# Patient Record
Sex: Female | Born: 1985 | Race: Black or African American | Hispanic: No | Marital: Married | Smoking: Former smoker
Health system: Southern US, Community
[De-identification: ages and names within clinical notes are randomized; demographics above are authoritative.]

## PROBLEM LIST (undated history)

## (undated) ENCOUNTER — Inpatient Hospital Stay (HOSPITAL_COMMUNITY): Payer: Self-pay

## (undated) DIAGNOSIS — D649 Anemia, unspecified: Secondary | ICD-10-CM

## (undated) DIAGNOSIS — K589 Irritable bowel syndrome without diarrhea: Secondary | ICD-10-CM

## (undated) DIAGNOSIS — Z5189 Encounter for other specified aftercare: Secondary | ICD-10-CM

## (undated) DIAGNOSIS — M24419 Recurrent dislocation, unspecified shoulder: Secondary | ICD-10-CM

## (undated) HISTORY — PX: WISDOM TOOTH EXTRACTION: SHX21

---
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
## 2004-07-09 ENCOUNTER — Ambulatory Visit (HOSPITAL_COMMUNITY): Admission: AD | Admit: 2004-07-09 | Discharge: 2004-07-09 | Payer: Self-pay | Admitting: Obstetrics and Gynecology

## 2004-08-13 ENCOUNTER — Inpatient Hospital Stay (HOSPITAL_COMMUNITY): Admission: AD | Admit: 2004-08-13 | Discharge: 2004-08-16 | Payer: Self-pay | Admitting: Obstetrics and Gynecology

## 2004-08-18 ENCOUNTER — Emergency Department (HOSPITAL_COMMUNITY): Admission: EM | Admit: 2004-08-18 | Discharge: 2004-08-18 | Payer: Self-pay | Admitting: Emergency Medicine

## 2005-05-13 ENCOUNTER — Ambulatory Visit: Payer: Self-pay | Admitting: Internal Medicine

## 2005-07-13 ENCOUNTER — Emergency Department (HOSPITAL_COMMUNITY): Admission: EM | Admit: 2005-07-13 | Discharge: 2005-07-13 | Payer: Self-pay | Admitting: Emergency Medicine

## 2006-07-17 ENCOUNTER — Inpatient Hospital Stay (HOSPITAL_COMMUNITY): Admission: AD | Admit: 2006-07-17 | Discharge: 2006-07-20 | Payer: Self-pay | Admitting: Obstetrics and Gynecology

## 2006-08-18 ENCOUNTER — Inpatient Hospital Stay (HOSPITAL_COMMUNITY): Admission: AD | Admit: 2006-08-18 | Discharge: 2006-08-19 | Payer: Self-pay | Admitting: Obstetrics and Gynecology

## 2007-04-09 ENCOUNTER — Emergency Department (HOSPITAL_COMMUNITY): Admission: EM | Admit: 2007-04-09 | Discharge: 2007-04-09 | Payer: Self-pay | Admitting: Emergency Medicine

## 2007-12-12 ENCOUNTER — Emergency Department (HOSPITAL_COMMUNITY): Admission: EM | Admit: 2007-12-12 | Discharge: 2007-12-12 | Payer: Self-pay | Admitting: Emergency Medicine

## 2008-05-04 ENCOUNTER — Emergency Department (HOSPITAL_COMMUNITY): Admission: EM | Admit: 2008-05-04 | Discharge: 2008-05-04 | Payer: Self-pay | Admitting: Emergency Medicine

## 2008-06-06 ENCOUNTER — Other Ambulatory Visit: Admission: RE | Admit: 2008-06-06 | Discharge: 2008-06-06 | Payer: Self-pay | Admitting: Obstetrics and Gynecology

## 2008-06-30 ENCOUNTER — Ambulatory Visit (HOSPITAL_COMMUNITY): Payer: Self-pay | Admitting: Obstetrics & Gynecology

## 2008-06-30 ENCOUNTER — Encounter (HOSPITAL_COMMUNITY): Admission: RE | Admit: 2008-06-30 | Discharge: 2008-07-30 | Payer: Self-pay | Admitting: Obstetrics & Gynecology

## 2008-07-24 ENCOUNTER — Emergency Department (HOSPITAL_COMMUNITY): Admission: EM | Admit: 2008-07-24 | Discharge: 2008-07-24 | Payer: Self-pay | Admitting: Emergency Medicine

## 2008-12-02 ENCOUNTER — Inpatient Hospital Stay (HOSPITAL_COMMUNITY): Admission: AD | Admit: 2008-12-02 | Discharge: 2008-12-04 | Payer: Self-pay | Admitting: Obstetrics and Gynecology

## 2008-12-02 ENCOUNTER — Ambulatory Visit: Payer: Self-pay | Admitting: Advanced Practice Midwife

## 2008-12-20 ENCOUNTER — Emergency Department (HOSPITAL_COMMUNITY): Admission: EM | Admit: 2008-12-20 | Discharge: 2008-12-20 | Payer: Self-pay | Admitting: Emergency Medicine

## 2009-04-20 ENCOUNTER — Emergency Department (HOSPITAL_COMMUNITY): Admission: EM | Admit: 2009-04-20 | Discharge: 2009-04-20 | Payer: Self-pay | Admitting: Emergency Medicine

## 2010-05-20 ENCOUNTER — Emergency Department (HOSPITAL_COMMUNITY): Payer: Medicaid Other

## 2010-05-20 ENCOUNTER — Emergency Department (HOSPITAL_COMMUNITY)
Admission: EM | Admit: 2010-05-20 | Discharge: 2010-05-20 | Disposition: A | Discharge status: Home / Self Care | Payer: Medicaid Other | Attending: Emergency Medicine | Admitting: Emergency Medicine

## 2010-05-20 DIAGNOSIS — K5900 Constipation, unspecified: Secondary | ICD-10-CM | POA: Insufficient documentation

## 2010-05-20 DIAGNOSIS — R109 Unspecified abdominal pain: Secondary | ICD-10-CM | POA: Insufficient documentation

## 2010-05-20 LAB — URINALYSIS, ROUTINE W REFLEX MICROSCOPIC
Bilirubin Urine: NEGATIVE
Nitrite: NEGATIVE
Specific Gravity, Urine: 1.01 (ref 1.005–1.030)
pH: 5.5 (ref 5.0–8.0)

## 2010-05-20 LAB — CBC
HCT: 46 % (ref 36.0–46.0)
MCV: 86.3 fL (ref 78.0–100.0)
Platelets: 144 10*3/uL — ABNORMAL LOW (ref 150–400)
RBC: 5.33 MIL/uL — ABNORMAL HIGH (ref 3.87–5.11)
WBC: 9.6 10*3/uL (ref 4.0–10.5)

## 2010-05-20 LAB — DIFFERENTIAL
Lymphocytes Relative: 13 % (ref 12–46)
Lymphs Abs: 1.3 10*3/uL (ref 0.7–4.0)
Neutrophils Relative %: 80 % — ABNORMAL HIGH (ref 43–77)

## 2010-05-20 LAB — COMPREHENSIVE METABOLIC PANEL
ALT: 8 U/L (ref 0–35)
Calcium: 9.1 mg/dL (ref 8.4–10.5)
Creatinine, Ser: 0.66 mg/dL (ref 0.4–1.2)
GFR calc non Af Amer: >60 mL/min (ref >60)
Glucose, Bld: 81 mg/dL (ref 70–99)
Sodium: 136 mEq/L (ref 135–145)
Total Protein: 7.2 g/dL (ref 6.0–8.3)

## 2010-05-20 LAB — URINE MICROSCOPIC-ADD ON

## 2010-05-20 LAB — POCT PREGNANCY, URINE: Preg Test, Ur: NEGATIVE

## 2010-05-21 LAB — URINE CULTURE: Culture  Setup Time: 201203261840

## 2010-05-30 LAB — RPR: RPR Ser Ql: NONREACTIVE

## 2010-05-30 LAB — CBC
MCHC: 31 g/dL (ref 30.0–36.0)
MCV: 73.8 fL — ABNORMAL LOW (ref 78.0–100.0)
Platelets: 208 10*3/uL (ref 150–400)
RDW: 19.7 % — ABNORMAL HIGH (ref 11.5–15.5)

## 2010-06-06 LAB — PREGNANCY, URINE: Preg Test, Ur: POSITIVE

## 2010-06-06 LAB — URINALYSIS, ROUTINE W REFLEX MICROSCOPIC
Bilirubin Urine: NEGATIVE
Hgb urine dipstick: NEGATIVE
Ketones, ur: NEGATIVE mg/dL
Nitrite: NEGATIVE
Urobilinogen, UA: 0.2 mg/dL (ref 0.0–1.0)

## 2010-06-06 LAB — DIFFERENTIAL
Basophils Relative: 0 % (ref 0–1)
Eosinophils Absolute: 0.2 10*3/uL (ref 0.0–0.7)
Lymphs Abs: 0.4 10*3/uL — ABNORMAL LOW (ref 0.7–4.0)
Monocytes Absolute: 0.4 10*3/uL (ref 0.1–1.0)
Monocytes Relative: 6 % (ref 3–12)
Neutro Abs: 5.7 10*3/uL (ref 1.7–7.7)
Neutrophils Relative %: 84 % — ABNORMAL HIGH (ref 43–77)

## 2010-06-06 LAB — GC/CHLAMYDIA PROBE AMP, GENITAL
Chlamydia, DNA Probe: NEGATIVE
GC Probe Amp, Genital: NEGATIVE

## 2010-06-06 LAB — CBC
Hemoglobin: 8.4 g/dL — ABNORMAL LOW (ref 12.0–15.0)
MCHC: 29.8 g/dL — ABNORMAL LOW (ref 30.0–36.0)
MCV: 60.6 fL — ABNORMAL LOW (ref 78.0–100.0)
RBC: 4.66 MIL/uL (ref 3.87–5.11)
WBC: 6.8 10*3/uL (ref 4.0–10.5)

## 2010-06-06 LAB — WET PREP, GENITAL: Trich, Wet Prep: NONE SEEN

## 2010-07-09 NOTE — H&P (Signed)
[name], [name]                ACCOUNT NO.:  [id number]   MEDICAL RECORD NO.:  [id number]          PATIENT TYPE:  OIB   LOCATION:  LDR1                          FACILITY:  [facility]   PHYSICIAN:  Tilda Burrow, M.D. DATE OF BIRTH:  13-Aug-1985   DATE OF ADMISSION:  08/18/2006  DATE OF DISCHARGE:  LH                              HISTORY & PHYSICAL   REASON FOR ADMISSION:  Pregnancy at 38 weeks, 4 cm, prodromal labor,  maternal fetal discomfort, request for induction of labor.  After  carefully discussing risks and benefits with [facility], she feels that the  benefits outweigh the risks, and she desires induction.   PAST MEDICAL HISTORY:  Anemia.   PAST SURGICAL HISTORY:  Negative.   ALLERGIES:  SULFA.   FAMILY HISTORY:  Benign.   PRENATAL COURSE:  Essentially uneventful with the exception she has  chronic anemia.  Blood type O positive.  UDS is negative.  Rubella  immune, hepatitis B surface antigen negative, HIV negative, HSV  negative, HPV negative, serology nonreactive.  Pap normal.  GC/Chlamydia  on both cultures were negative. AFP normal.  GBS was done at the  hospital.  We will get the results over there.  A 28-week hemoglobin  9.4, a 28-week hematocrit 31.3, one-hour glucose was 110, sickle cell  screen was negative.   PHYSICAL EXAMINATION:  VITAL SIGNS:  Weight 172, blood pressure 122/64.  Fetal heart rate 140, strong and regular.  Fundal height is 38-39 cm.   Cervix is 4 cm, 80% effaced, -1 station.   PLAN:  We are going to admit for Pitocin augmentation and induction of  labor.      Zerita Boers, Lanier Clam      Tilda Burrow, M.D.  Electronically Signed    DL/MEDQ  D:  04/54/0981  T:  08/18/2006  Job:  [id number]   cc:   Jeoffrey Massed, MD  Fax: [phone]   Tilda Burrow, M.D.  Fax: [phone]

## 2010-07-09 NOTE — Op Note (Signed)
[name], [name]                ACCOUNT NO.:  [id number]   MEDICAL RECORD NO.:  [id number]          PATIENT TYPE:  OIB   LOCATION:  LDR1                          FACILITY:  [facility]   PHYSICIAN:  Tilda Burrow, M.D. DATE OF BIRTH:  05-29-1985   DATE OF PROCEDURE:  08/18/2006  DATE OF DISCHARGE:                               OPERATIVE REPORT   PROCEDURE:  Epidural catheter placement.  Epidural time 1 p.m.   SURGEON:  Tilda Burrow, M.D.   DESCRIPTION OF PROCEDURE:  A continuous lumbar epidural catheter placed  in the L2-3 interspace on the first attempt at a depth of 5 cm beneath  the skin using a loss of resistance technique, after the standard  prepping and draping of the back.  A #19 gauge Tuohy needle was used for  the placement.  The catheter was placed after a 3 mL test dose of 1.5%  Xylocaine.  There was no maternal tachycardia.  This was followed by a 3  mL bolus of 1.5% Xylocaine with epinephrine.  The patient then began to  develop a strong urge to push, so we went ahead and completed taping the  catheter to the back and repositioned the patient in the supine position  before initiating the bolus.  The test dose was sufficient to give her  good symmetric analgesic effect over the abdomen, but with perineal  sensation intact.  The patient was found to be completely dilated at a  +1 station.   PROCEDURE:  .    Delivery note.   DESCRIPTION OF PROCEDURE:  The patient progressed and took about 30  minutes before being able to push the baby out.  She initially had  minimal urge to push and then developed urge to push and pushed through  about three contractions, bringing the baby in nicely to delivery from  the direct OA position.  The right shoulder was impacted beneath the  symphysis pubis, so with the legs in the McRoberts position, the  posterior shoulder was identified and rotated in a corkscrew maneuver  with ease, and the baby delivered with the left arm first,  completely  released and then the rest of the baby delivered.  The placenta was  delivered after the baby was completely delivered.  The cord had been  clamped and the infant dried off on the maternal abdomen.  The cord  blood samples were obtained prior to delivery of the placenta.  The  perineum showed first-degree lacerations only, none requiring suture  repair.  The estimated blood loss was 500 mL due to her tendency toward  uterine apnea occurring about 10 minutes after delivery.      Tilda Burrow, M.D.  Electronically Signed    JVF/MEDQ  D:  08/18/2006  T:  08/18/2006  Job:  [id number]

## 2010-07-09 NOTE — Discharge Summary (Signed)
[name], [name]                ACCOUNT NO.:  [id number]   MEDICAL RECORD NO.:  [id number]          PATIENT TYPE:  INP   LOCATION:  A415                          FACILITY:  [facility]   PHYSICIAN:  Lazaro Arms, M.D.   DATE OF BIRTH:  1985-03-21   DATE OF ADMISSION:  07/17/2006  DATE OF DISCHARGE:  05/26/2008LH                               DISCHARGE SUMMARY   DISCHARGE DIAGNOSES:  1. Intrauterine pregnancy at 36-1/[redacted] weeks gestation.  2. Preterm labor.  3. Unremarkable hospital course.   Please refer to the history and physical and antepartum chart for  details of admission to the hospital.   HOSPITAL COURSE:  The patient was admitted at 33-1/2 weeks with regular  uterine contractions.  She was found to be 2 to 3 cm, 50% effaced, and -  1 to -2 station.  She was immediately begun on magnesium sulfate  tocolysis.  She was given betamethasone.  She had a Foley catheter  placed and placed on bedrest.  Her contractions were every 2 to 6  minutes, and they quickly dissipated.  Occasionally she would have  uterine irritability, but her cervix less than 24 hours after admission  was unchanged.  I checked her again in the morning of Jul 20, 2006 and  she was 2, 50%, and -2 vertex.  The cervix was soft, but there was no  pressure on the lower uterine segment as it was before.  She was  switched over from magnesium tocolysis after approximately 36 hours of  tocolysis over to terbutaline.  She has done well on terbutaline.  Of  course, she is jittery but handling it well.  She is discharged to home  on the morning of Jul 20, 2006 in stable condition with 5 mg of  terbutaline every 4 hours, and I will see her in the office next week.  Again, she is status post betamethasone x2.      Lazaro Arms, M.D.  Electronically Signed     LHE/MEDQ  D:  07/20/2006  T:  07/20/2006  Job:  [id number]

## 2010-07-09 NOTE — H&P (Signed)
NAMEDWAYNE, BEGAY                ACCOUNT NO.:  [id number]   MEDICAL RECORD NO.:  [id number]          PATIENT TYPE:  INP   LOCATION:  A415                          FACILITY:  [facility]   PHYSICIAN:  Lazaro Arms, M.D.   DATE OF BIRTH:  1985-04-26   DATE OF ADMISSION:  07/17/2006  DATE OF DISCHARGE:  LH                              HISTORY & PHYSICAL   Alayne is a 25 year old gravida 2, para 1, estimated date of delivery  of 09/02/2006, 33-1/[redacted] weeks gestation, who presented to labor and  delivery complaining of regular uterine contractions.  She did not have  that problem with her first pregnancy, she went to full-time with no  problems.  She is having contractions every 2-6 minutes.  She is 2-3 cm,  50% effaced and -1, -2 station.  As a result she will be begun on  magnesium sulfate tocolysis.   PAST MEDICAL HISTORY:  Negative.   PAST SURGERY:  Negative.   ALLERGIES:  SULFA DRUGS.   FAMILY HISTORY:  She is married, works as a [employer] manager.   She had a vaginal delivery in 2006, 7 pounds 9 ounces.   Blood type is 0 positive.  Varicella immune, rubella immune.  Hepatitis  B is negative.  HSV-2 is negative.  HIV is nonreactive.  GC/chlamydia  negative x2.  Pap was normal.  Serology is nonreactive.  Group B strep  culture is obtained today.  Her Glucola is 110.   IMPRESSION:  1. Intrauterine pregnancy 33-1/[redacted] weeks gestation.  2. Preterm labor.   PLAN:  The patient is going to get magnesium tocolysis and betamethasone  injections.      Lazaro Arms, M.D.  Electronically Signed     LHE/MEDQ  D:  07/20/2006  T:  07/20/2006  Job:  [id number]

## 2010-07-12 NOTE — Op Note (Signed)
NAMECONCEPTION, DOEBLER              ACCOUNT NO.:  [id number]   MEDICAL RECORD NO.:  [id number]          PATIENT TYPE:  INP   LOCATION:  LDR1                          FACILITY:  [facility]   PHYSICIAN:  Tilda Burrow, M.D. DATE OF BIRTH:  04/02/1985   DATE OF PROCEDURE:  08/14/2004  DATE OF DISCHARGE:                                 OPERATIVE REPORT   ONSET OF LABOR:  August 14, 2004, at 8 a.m.   DATE OF DELIVERY:  August 14, 2004, at 1406.   DELIVERY NOTE:  Inayah had a normal spontaneous vaginal delivery of a  viable female infant under epidural anesthesia per Dr. Despina Hidden.  Upon delivery of  head, infant rotated and spontaneously delivered shoulders and rest of the  body.  Mouth and nose were thoroughly suctioned.  Infant dried.  Cord  clamped and cut.  Infant to nursery.  Nurses for newborn care.  On delivery,  infant had spontaneous respirations, spontaneous movement of all  extremities, and pinked up well.   Upon inspection, there is a left labial laceration which required local  anesthetic and subcuticular closure with 2-0 Vicryl.  The patient tolerated  the procedure well.  There was a very small right labial laceration which  required no stitching.  Perineum was intact.  l   Third stage of labor was actively managed with 20 units of Pitocin and 1000  mL D5 lactated Ringers at a rapid rate.  Placenta delivered spontaneously  via Manus Rudd mechanism.  Upon inspection, three-vessel cord is noted, and  membranes are noted to be intact upon inspection. Estimated blood loss was  approximately 300 mL.  Infant and mother both stabilized.  Epidural catheter  was removed with blue tip intact and transferred up to the postpartum unit  in stable condition.       DL/MEDQ  D:  16/11/9602  T:  08/14/2004  Job:  [id number]   cc:   [facility]

## 2010-07-12 NOTE — Op Note (Signed)
[name], [name]                ACCOUNT NO.:  [id number]   MEDICAL RECORD NO.:  [id number]          PATIENT TYPE:  INP   LOCATION:  A401                          FACILITY:  [facility]   PHYSICIAN:  Tilda Burrow, M.D. DATE OF BIRTH:  [birth year]   DATE OF PROCEDURE:  08/14/2004  DATE OF DISCHARGE:  08/16/2004                                 OPERATIVE REPORT   PROCEDURE:  Continuous lumbar epidural catheter was placed using loss of  resistance technique at the L3-4 interspace.  The patient had draping of the  back, placement of a 19-gauge Tuohy needle in the L2-3 interspace, with loss  of resistance technique to identify the epidural space.  A 5 mL test dose of  1.5% lidocaine with epinephrine was instilled, followed by 0.125% Marcaine  used in same.  Continuous infusion of 12 mL/hr., and the patient tolerated  the epidural with good analgesic effect achieved.  See delivery note  dictated later for further delivery management issue answers.       JVF/MEDQ  D:  09/11/2004  T:  09/12/2004  Job:  [id number]

## 2010-07-12 NOTE — Consult Note (Signed)
NAMEMARZELL, [name]                ACCOUNT NO.:  [id number]   MEDICAL RECORD NO.:  [id number]          PATIENT TYPE:  EMS   LOCATION:  ED                            FACILITY:  [facility]   PHYSICIAN:  Tilda Burrow, M.D. DATE OF BIRTH:  [birth year]   DATE OF CONSULTATION:  08/18/2004  DATE OF DISCHARGE:                                   CONSULTATION   EMERGENCY ROOM CONSULTATION:   CHIEF COMPLAINT:  Severe pain in near laceration from delivery.  This 25-  year-old female less than 1 week postpartum called yesterday complaining of  severe pain where she was stitched up after lacerations at delivery.  Inspection reveals normal perineal body with well-healed first degree skin  cracks only.  She had two periurethral lacerations on either side of the  urethral orifice and the one on the left was repaired apparently with 0  Dexon with the knot protruding from the skin and being exquisitely  sensitive, the right side is healing nicely.  Stitches placed under traction  and snipped with removal of stitch.  Patient to resume routine postpartum  care.   IMPRESSION:  Tenderness secondary to exposed suture status post perineal  lacerations at delivery.  Follow up p.r.n.  Routine postpartum care orders  resumed.       JVF/MEDQ  D:  08/18/2004  T:  08/18/2004  Job:  [id number]

## 2010-07-12 NOTE — H&P (Signed)
[name], [name]              ACCOUNT NO.:  [id number]   MEDICAL RECORD NO.:  [id number]           PATIENT TYPE:  INP   LOCATION:  LDR1                          FACILITY:  [facility]   PHYSICIAN:  Tilda Burrow, M.D. DATE OF BIRTH:  [birth year]   DATE OF ADMISSION:  08/13/2004  DATE OF DISCHARGE:  LH                                HISTORY & PHYSICAL   REASON FOR ADMISSION:  Pregnancy at 40 weeks for elective induction of labor  due to maternal discomfort and fatigue.   HISTORY OF PRESENT ILLNESS:  The patient is a 25 year old, gravida 1, para  0, EDC June 21, who is seen in the office today and complains of inability  to sleep due to internal discomfort and also fatigue.   PAST MEDICAL HISTORY:  Negative.   PAST SURGICAL HISTORY:  Negative.   ALLERGIES:  She is allergic to SULFA.   SOCIAL HISTORY:  She is single.  Father of pregnancy is present and  supportive.   FAMILY HISTORY:  Negative.   PRENATAL COURSE:  Uneventful.  Blood type is O positive.  Urine drug screen  is negative.  HIV negative.  HSV-1 positive.  HSV-2 negative. GC and  chlamydia are negative.  GBS is negative.  Twenty-eight-week hemoglobin 9.9.  28-week hematocrit 30.0.  One-hour glucose 126.  Screen was negative.   PHYSICAL EXAMINATION:  VITAL SIGNS:  Today, weight is 160, blood pressure  110/68.  There is good fetal movement.  Fetal heart tones 140, strong and  regular.  PELVIC: Cervix is 2 cm, 80% effaced, -1 station.   After assessing risks and benefits with the patient, she continues to desire  elective induction of labor.  She is to be admitted tonight for Foley bulb  induction of labor and Pitocin in the a.m.       DL/MEDQ  D:  08/65/7846  T:  08/13/2004  Job:  [id number]

## 2010-07-31 ENCOUNTER — Emergency Department (HOSPITAL_COMMUNITY)
Admission: EM | Admit: 2010-07-31 | Discharge: 2010-07-31 | Disposition: A | Discharge status: Home / Self Care | Payer: Medicaid Other | Attending: Emergency Medicine | Admitting: Emergency Medicine

## 2010-07-31 DIAGNOSIS — F17200 Nicotine dependence, unspecified, uncomplicated: Secondary | ICD-10-CM | POA: Insufficient documentation

## 2010-07-31 DIAGNOSIS — L989 Disorder of the skin and subcutaneous tissue, unspecified: Secondary | ICD-10-CM | POA: Insufficient documentation

## 2010-08-02 LAB — HERPES SIMPLEX VIRUS CULTURE: Culture: NOT DETECTED

## 2010-08-03 LAB — CULTURE, ROUTINE-ABSCESS: Gram Stain: NONE SEEN

## 2010-08-19 ENCOUNTER — Emergency Department (HOSPITAL_COMMUNITY)
Admission: EM | Admit: 2010-08-19 | Discharge: 2010-08-19 | Disposition: A | Discharge status: Home / Self Care | Payer: Medicaid Other | Attending: Emergency Medicine | Admitting: Emergency Medicine

## 2010-08-19 DIAGNOSIS — L259 Unspecified contact dermatitis, unspecified cause: Secondary | ICD-10-CM | POA: Insufficient documentation

## 2010-09-30 ENCOUNTER — Emergency Department (HOSPITAL_COMMUNITY)
Admission: EM | Admit: 2010-09-30 | Discharge: 2010-09-30 | Disposition: A | Discharge status: Home / Self Care | Payer: Medicaid Other | Attending: Emergency Medicine | Admitting: Emergency Medicine

## 2010-09-30 DIAGNOSIS — F17200 Nicotine dependence, unspecified, uncomplicated: Secondary | ICD-10-CM | POA: Insufficient documentation

## 2010-09-30 DIAGNOSIS — R21 Rash and other nonspecific skin eruption: Secondary | ICD-10-CM

## 2010-09-30 MED ORDER — MUPIROCIN CALCIUM 2 % EX CREA: TOPICAL_CREAM | Freq: Three times a day (TID) | CUTANEOUS | Status: AC

## 2010-09-30 MED ORDER — HYDROXYZINE HCL 25 MG PO TABS: 25.0000 mg | ORAL_TABLET | Freq: Four times a day (QID) | ORAL | Status: AC | PRN

## 2010-09-30 MED ORDER — ACYCLOVIR 400 MG PO TABS: 400.0000 mg | ORAL_TABLET | Freq: Three times a day (TID) | ORAL | Status: AC

## 2010-09-30 NOTE — ED Notes (Signed)
Pt reports rash to r lower leg x 5 months.  Area is on inner aspect of calf.  Has scabby appearance.  Reports got better with prednisone but when stopped taking prednisone rash got bigger.  Rash also spreading to left leg now.  Pt says was told to follow up with dermatologist but says no one is accepting medicaid.

## 2010-09-30 NOTE — ED Provider Notes (Signed)
History   Chart scribed for Laray Anger, DO by Enos Fling; the patient was seen in room APA01/APA01; this patient's care was started at 9:44 AM.    CSN: [id number] Arrival date & time: 09/30/2010  7:56 AM  Chief Complaint  Patient presents with  . Rash   HPI [name] is a 25 y.o. female who presents to the Emergency Department complaining of rash. Pt reports itchy dry rash to right medial upper calf that has been constant and worsening x 5 months. At onset, rash was red, itchy, and weeping and appeared to have central spot and is growing larger; now it is dry and scabbing throughout the rash but still itchy, states under the scab the skin is gray with bumps. Pt has been seen in ED 7 times and at UC 4 times, taken 2 rounds of doxycycline (last finished 3 days ago) with no relief, and acyclovir treatment once which clear rash but then it returned. Also reports similar small rash starting to left lateral knee and also a dark dry spot to her left lip onset within last few days. No other rashes noted. Pt has been referred to dermatologist several times but unable to go d/t medicaid. Denies f/c, cough, congestion, ha, n/v/d, sob, or cp.   History reviewed. No pertinent past medical history.  History reviewed. No pertinent past surgical history.  History reviewed. No pertinent family history.  History  Substance Use Topics  . Smoking status: Current Everyday Smoker  . Smokeless tobacco: Not on file  . Alcohol Use: No    OB History    Grav Para Term Preterm Abortions TAB SAB Ect Mult Living                  Review of Systems ROS: Statement: All systems negative except as marked or noted in the HPI; Constitutional: Negative for fever and chills. ; ; Eyes: Negative for eye pain and discharge. ; ; ENMT: Negative for ear pain, hoarseness, nasal congestion, sinus pressure and sore throat. ; ; Cardiovascular: Negative for chest pain, palpitations, diaphoresis, dyspnea and  peripheral edema. ; ; Respiratory: Negative for cough, wheezing and stridor. ; ; Gastrointestinal: Negative for nausea, vomiting, diarrhea and abdominal pain. ; ; Genitourinary: Negative for dysuria, flank pain and hematuria. ; ; Musculoskeletal: Negative for back pain and neck pain. ; ; Skin: Positive for rash and skin lesion, +pruritis. ; Neuro: Negative for headache, lightheadedness and neck stiffness. ;   Physical Exam  BP 121/78  Pulse 79  Temp(Src) 97.7 F (36.5 C) (Oral)  Resp 16  Ht 5\' 6"  (1.676 m)  Wt 155 lb (70.308 kg)  BMI 25.02 kg/m2  SpO2 99%  Physical Exam 0945: Physical examination:  Nursing notes reviewed; Vital signs and O2 SAT reviewed;  Constitutional: Well developed, Well nourished, Well hydrated, In no acute distress; Head:  Normocephalic, atraumatic; Eyes: EOMI, PERRL, No scleral icterus; ENMT: Mouth and pharynx normal, Mucous membranes moist, +several dark spots on lips with surrounding dry/peeling skin; Neck: Supple, Full range of motion, No lymphadenopathy; Cardiovascular: Regular rate and rhythm, No murmur, rub, or gallop; Respiratory: Breath sounds clear & equal bilaterally, No rales, rhonchi, wheezes, or rub, Normal respiratory effort/excursion; Chest: Nontender, Movement normal; Abdomen: Soft, Nontender, Nondistended, Normal bowel sounds; Extremities: Pulses normal, No tenderness, No edema, No calf edema or asymmetry.; Neuro: AA&Ox3, Major CN grossly intact.  No gross focal motor or sensory deficits in extremities.; Skin: Color normal, Warm, Dry.  +dried scabbed area approx  8x4cm diameter medial right calf with surrounding excoriations, with same rash on left lateral lower ext approx 3x3cm area.   ED Course  Procedures MDM Number of Diagnoses or Management Options MDM Reviewed: previous chart and vitals   10:21 AM:  Pt describes the rash underneath the scab as "little bumps that look wet."  Pt has been taking doxycycline x2 courses, as well as clotrimazole cream  over the past 5 months, rx by [facility]  Has not seen her PMD [facility]) for same.  Pt was seen in ED 07/2010 for same x2.  Pt very specifically states "whatever Ms. Tripplet gave me worked."  Per [employer] review, pt was tx acyclovir at that time and instructed to f/u with Derm MD.  States she needs her PMD to refer her to Derm MD, which she hasn't asked her PMD to do that "yet."  Will rx another course of acyclovir.  Pt encouraged to f/u with her PMD and Derm MD for definitive dx/tx.  Verb understanding.      I personally performed the services described in this documentation, which was scribed in my presence. The recorded information has been reviewed and considered.   Alysandra Lobue Allison Quarry, DO 09/30/10 2030

## 2010-10-27 ENCOUNTER — Emergency Department (HOSPITAL_COMMUNITY)
Admission: EM | Admit: 2010-10-27 | Discharge: 2010-10-27 | Disposition: A | Discharge status: Home / Self Care | Payer: Medicaid Other | Attending: Emergency Medicine | Admitting: Emergency Medicine

## 2010-10-27 ENCOUNTER — Encounter (HOSPITAL_COMMUNITY): Payer: Self-pay | Admitting: *Deleted

## 2010-10-27 ENCOUNTER — Emergency Department (HOSPITAL_COMMUNITY): Payer: Medicaid Other

## 2010-10-27 DIAGNOSIS — Y92009 Unspecified place in unspecified non-institutional (private) residence as the place of occurrence of the external cause: Secondary | ICD-10-CM | POA: Insufficient documentation

## 2010-10-27 DIAGNOSIS — X500XXA Overexertion from strenuous movement or load, initial encounter: Secondary | ICD-10-CM | POA: Insufficient documentation

## 2010-10-27 DIAGNOSIS — S43006A Unspecified dislocation of unspecified shoulder joint, initial encounter: Secondary | ICD-10-CM

## 2010-10-27 DIAGNOSIS — S43016A Anterior dislocation of unspecified humerus, initial encounter: Secondary | ICD-10-CM | POA: Insufficient documentation

## 2010-10-27 MED ORDER — SODIUM CHLORIDE 0.9 % IV SOLN
Freq: Once | INTRAVENOUS | Status: AC
  Administered 2010-10-27: 03:00:00 via INTRAVENOUS

## 2010-10-27 MED ORDER — ETOMIDATE 2 MG/ML IV SOLN
10.0000 mg | Freq: Once | INTRAVENOUS | Status: AC
  Administered 2010-10-27: 10 mg via INTRAVENOUS
  Filled 2010-10-27: qty 10

## 2010-10-27 NOTE — ED Notes (Signed)
Patient is resting comfortably. 

## 2010-10-27 NOTE — ED Notes (Signed)
Pt turned over in bed and her right shoulder popped out of place.

## 2010-10-27 NOTE — ED Notes (Signed)
See procedural sedation flowsheet. Relocation of rt shoulder without incident confirmed xray

## 2010-10-27 NOTE — ED Notes (Signed)
Family at bedside. 

## 2010-10-27 NOTE — ED Notes (Signed)
Vital signs stable. 

## 2010-10-27 NOTE — ED Notes (Signed)
Pt self ambulated with a steady gait with stand by assist to the rest room and back.

## 2010-10-27 NOTE — ED Provider Notes (Signed)
History     CSN: [id number] Arrival date & time: 10/27/2010  2:28 AM  Chief Complaint  Patient presents with  . Shoulder Injury  . Shoulder Pain   HPI Comments: Seen 20  Patient is a 25 y.o. female presenting with shoulder injury and shoulder pain. The history is provided by the patient.  Shoulder Injury This is a new (Patient with h/o shoulder dislocation due to basketball injury in 2008. Recurrent dislocation infrequent.) problem. The current episode started 1 to 2 hours ago (crawling up on the bed and felt shoulder go out.). The problem has not changed since onset.Exacerbated by: movement. The symptoms are relieved by nothing. She has tried nothing for the symptoms.  Shoulder Pain    History reviewed. No pertinent past medical history.  No past surgical history on file.  History reviewed. No pertinent family history.  History  Substance Use Topics  . Smoking status: Current Everyday Smoker  . Smokeless tobacco: Not on file  . Alcohol Use: Yes    OB History    Grav Para Term Preterm Abortions TAB SAB Ect Mult Living                  Review of Systems  All other systems reviewed and are negative.    Physical Exam  BP 126/78  Pulse 92  Temp 97.4 F (36.3 C)  Resp 20  Ht 5\' 6"  (1.676 m)  Wt 155 lb (70.308 kg)  BMI 25.02 kg/m2  SpO2 99%  Physical Exam  Constitutional: She is oriented to person, place, and time. She appears well-developed and well-nourished. She appears distressed.  HENT:  Head: Normocephalic and atraumatic.  Eyes: EOM are normal.  Neck: Normal range of motion.  Cardiovascular: Normal rate, normal heart sounds and intact distal pulses.   Pulmonary/Chest: Effort normal and breath sounds normal.  Abdominal: Soft.  Musculoskeletal:       Anterior dislocation of right shoulder. Pulses 2+, cap refill brisk. Able to move fingers, wrist, elbow.  Neurological: She is alert and oriented to person, place, and time.  Skin: Skin is warm and dry.     ED Course  shoulder reduction Date/Time: 10/27/2010 4:30 AM Performed by: Annamarie Dawley. Authorized by: Annamarie Dawley Consent: Verbal consent obtained. Risks and benefits: risks, benefits and alternatives were discussed Consent given by: patient Patient understanding: patient states understanding of the procedure being performed Patient consent: the patient's understanding of the procedure matches consent given Procedure consent: procedure consent matches procedure scheduled Relevant documents: relevant documents present and verified Test results: test results available and properly labeled Patient identity confirmed: verbally with patient and arm band Injury location: shoulder Pre-procedure distal perfusion: normal Pre-procedure neurological function: normal Pre-procedure range of motion: reduced Local anesthesia used: no Patient sedated: yes Sedatives: etomidate Vitals: Vital signs were monitored during sedation. Immobilization: splint Post-procedure neurovascular assessment: post-procedure neurovascularly intact Comments: Patient tolerated procedure well. O2 sats remained 100%. Awakened without incidence.   Dg Shoulder Right  10/27/2010  *RADIOLOGY REPORT*  Clinical Data: Right shoulder pain and dislocation after fall.  RIGHT SHOULDER - 2+ VIEW  Comparison: 07/24/2008  Findings: Anterior dislocation of the right humeral head with respect to the glenoid.  Coracoclavicular and acromioclavicular spaces appear intact.  No focal bone lesions demonstrated.  IMPRESSION: Anterior dislocation of the right shoulder.  Original Report Authenticated By: Marlon Pel, M.D.   Dg Shoulder Right Port  10/27/2010  *RADIOLOGY REPORT*  Clinical Data: Post relocation view of the right shoulder  PORTABLE RIGHT SHOULDER - 2+ VIEW  Comparison: 10/27/2010 at 0252 hours  Findings: There is interval relocation of the previously demonstrated right shoulder dislocation.  Suggestion of slight  irregularity of the inferior glenoid which might represent a Bankart lesion.  No obvious fracture of the humeral head on single view.  IMPRESSION: The humerus appears to be relocated with respect to the glenoid. Suggestion of Bankart fracture of the inferior glenoid rim.  Original Report Authenticated By: Marlon Pel, M.D.   Patient with h/o R shoulder dislocation with minimal exertion. Conscious sedation and reduction of the shoulder with no complications. Shoulder immobilizer placed.  MDM Reviewed: nursing note and vitals Interpretation: x-ray      Nicoletta Dress. Colon Branch, MD 10/27/10 [phone]

## 2010-10-27 NOTE — ED Notes (Signed)
Pt self ambulated out with a steady gait stating no needs 

## 2010-12-11 LAB — CBC
HCT: 32.5 — ABNORMAL LOW
MCHC: 32.1
MCV: 74 — ABNORMAL LOW
Platelets: 217
RDW: 28.6 — ABNORMAL HIGH

## 2010-12-11 LAB — DIFFERENTIAL
Eosinophils Absolute: 0.1
Lymphs Abs: 1.1
Monocytes Absolute: 0.7
Neutrophils Relative %: 84 — ABNORMAL HIGH

## 2010-12-11 LAB — CORD BLOOD GAS (ARTERIAL): pH cord blood (arterial): 7.24

## 2010-12-11 LAB — ABO/RH: ABO/RH(D): O POS

## 2010-12-11 LAB — HEMOGLOBIN AND HEMATOCRIT, BLOOD
HCT: 30.6 — ABNORMAL LOW
Hemoglobin: 10 — ABNORMAL LOW

## 2011-08-09 ENCOUNTER — Emergency Department (HOSPITAL_COMMUNITY)
Admission: EM | Admit: 2011-08-09 | Discharge: 2011-08-09 | Disposition: A | Discharge status: Home / Self Care | Payer: Medicaid Other | Attending: Emergency Medicine | Admitting: Emergency Medicine

## 2011-08-09 ENCOUNTER — Encounter (HOSPITAL_COMMUNITY): Payer: Self-pay | Admitting: Emergency Medicine

## 2011-08-09 DIAGNOSIS — N946 Dysmenorrhea, unspecified: Secondary | ICD-10-CM

## 2011-08-09 DIAGNOSIS — N390 Urinary tract infection, site not specified: Secondary | ICD-10-CM | POA: Insufficient documentation

## 2011-08-09 DIAGNOSIS — F17200 Nicotine dependence, unspecified, uncomplicated: Secondary | ICD-10-CM | POA: Insufficient documentation

## 2011-08-09 LAB — POCT I-STAT, CHEM 8
BUN: 11 mg/dL (ref 6–23)
Calcium, Ion: 1.16 mmol/L (ref 1.12–1.32)
Chloride: 103 mEq/L (ref 96–112)
HCT: 43 % (ref 36.0–46.0)
Potassium: 3.6 mEq/L (ref 3.5–5.1)

## 2011-08-09 LAB — URINALYSIS, ROUTINE W REFLEX MICROSCOPIC
Bilirubin Urine: NEGATIVE
Nitrite: POSITIVE — AB
Protein, ur: >300 mg/dL — AB
Specific Gravity, Urine: 1.025 (ref 1.005–1.030)
Urobilinogen, UA: 4 mg/dL — ABNORMAL HIGH (ref 0.0–1.0)

## 2011-08-09 LAB — URINE MICROSCOPIC-ADD ON

## 2011-08-09 LAB — WET PREP, GENITAL
Clue Cells Wet Prep HPF POC: NONE SEEN
WBC, Wet Prep HPF POC: NONE SEEN

## 2011-08-09 LAB — PREGNANCY, URINE: Preg Test, Ur: NEGATIVE

## 2011-08-09 MED ORDER — ACETAMINOPHEN 500 MG PO TABS
1000.0000 mg | ORAL_TABLET | Freq: Once | ORAL | Status: AC
  Administered 2011-08-09: 1000 mg via ORAL
  Filled 2011-08-09: qty 2

## 2011-08-09 MED ORDER — NAPROXEN 250 MG PO TABS: 250.0000 mg | ORAL_TABLET | Freq: Two times a day (BID) | ORAL | Status: DC

## 2011-08-09 MED ORDER — CEPHALEXIN 500 MG PO CAPS: 500.0000 mg | ORAL_CAPSULE | Freq: Four times a day (QID) | ORAL | Status: AC

## 2011-08-09 MED ORDER — KETOROLAC TROMETHAMINE 60 MG/2ML IM SOLN
60.0000 mg | Freq: Once | INTRAMUSCULAR | Status: AC
  Administered 2011-08-09: 60 mg via INTRAMUSCULAR
  Filled 2011-08-09: qty 2

## 2011-08-09 NOTE — ED Provider Notes (Signed)
History   This chart was scribed for Laray Anger, DO by Sofie Rower. The patient was seen in room APA03/APA03 and the patient's care was started at 9:00 AM     CSN: [id number]  Arrival date & time 08/09/11  0840   First MD Initiated Contact with Patient 08/09/11 [phone]      Chief Complaint  Patient presents with  . Pelvic Pain    HPI  [name] is a 26 y.o. female who presents to the Emergency Department complaining of gradual onset and persistence of constant pelvic "pain" that began four days ago.  Has been associated with vaginal bleeding and nausea. Pt describes the pain as "sore."  Pt has been taking OTC advil with intermittent relief, with LD approx 0400 today.  Denies vaginal discharge, no fevers, no rash.     History  Substance Use Topics  . Smoking status: Current Everyday Smoker  . Smokeless tobacco: Not on file  . Alcohol Use: Yes    Review of Systems ROS: Statement: All systems negative except as marked or noted in the HPI; Constitutional: Negative for fever and chills. ; ; Eyes: Negative for eye pain, redness and discharge. ; ; ENMT: Negative for ear pain, hoarseness, nasal congestion, sinus pressure and sore throat. ; ; Cardiovascular: Negative for chest pain, palpitations, diaphoresis, dyspnea and peripheral edema. ; ; Respiratory: Negative for cough, wheezing and stridor. ; ; Gastrointestinal: Negative for nausea, vomiting, diarrhea, abdominal pain, blood in stool, hematemesis, jaundice and rectal bleeding. . ; ; Genitourinary: Negative for dysuria, flank pain and hematuria. ; GYN: +menses, +pelvic pain.  No vaginal discharge, no vulvar pain.   ; Musculoskeletal: Negative for back pain and neck pain. Negative for swelling and trauma.; ; Skin: Negative for pruritus, rash, abrasions, blisters, bruising and skin lesion.; ; Neuro: Negative for headache, lightheadedness and neck stiffness. Negative for weakness, altered level of consciousness , altered mental status,  extremity weakness, paresthesias, involuntary movement, seizure and syncope.     Allergies  Sulfa antibiotics  Home Medications   Current Outpatient Rx  Name Route Sig Dispense Refill  . ESTRADIOL 2 MG VA RING Vaginal Place 2 mg vaginally every 3 (three) months. follow package directions     . ESTRADIOL PO Oral Take 2 mg by mouth daily.      Marland Kitchen METRONIDAZOLE 0.75 % EX CREA Topical Apply topically 2 (two) times daily.        BP 126/70  Pulse 77  Temp 98.2 F (36.8 C) (Oral)  Resp 18  Ht 5\' 4"  (1.626 m)  Wt 165 lb (74.844 kg)  BMI 28.32 kg/m2  SpO2 100%  Physical Exam 0905: Physical examination:  Nursing notes reviewed; Vital signs and O2 SAT reviewed;  Constitutional: Well developed, Well nourished, Well hydrated, In no acute distress; Head:  Normocephalic, atraumatic; Eyes: EOMI, PERRL, No scleral icterus; ENMT: Mouth and pharynx normal, Mucous membranes moist; Neck: Supple, Full range of motion, No lymphadenopathy; Cardiovascular: Regular rate and rhythm, No murmur, rub, or gallop; Respiratory: Breath sounds clear & equal bilaterally, No rales, rhonchi, wheezes.  Speaking full sentences with ease, Normal respiratory effort/excursion; Chest: Nontender, Movement normal; Abdomen: Soft, Nontender, Nondistended, Normal bowel sounds; Genitourinary: No CVA tenderness; Pelvic exam performed with permission of pt and female ED RN assist during exam.  External genitalia w/o lesions. Vaginal vault without discharge, small amount dark blood with clots in vault.  Cervix w/o lesions, not friable, +IUD strings visualized from os.  GC/chlam and wet prep  obtained and sent to lab.  Bimanual exam w/o CMT, uterine or adnexal tenderness.; Extremities: Pulses normal, No tenderness, No edema, No calf edema or asymmetry.; Neuro: AA&Ox3, Major CN grossly intact.  Speech clear. Normal coordination, gait steady. No gross focal motor or sensory deficits in extremities.; Skin: Color normal, Warm, Dry.   ED Course    Procedures   MDM  MDM Reviewed: nursing note and vitals Interpretation: labs   Results for orders placed during the hospital encounter of 08/09/11  PREGNANCY, URINE      Component Value Range   Preg Test, Ur NEGATIVE  NEGATIVE  URINALYSIS, ROUTINE W REFLEX MICROSCOPIC      Component Value Range   Color, Urine RED (*) YELLOW   APPearance CLOUDY (*) CLEAR   Specific Gravity, Urine 1.025  1.005 - 1.030   pH 6.5  5.0 - 8.0   Glucose, UA 100 (*) NEGATIVE mg/dL   Hgb urine dipstick LARGE (*) NEGATIVE   Bilirubin Urine NEGATIVE  NEGATIVE   Ketones, ur 15 (*) NEGATIVE mg/dL   Protein, ur >119 (*) NEGATIVE mg/dL   Urobilinogen, UA 4.0 (*) 0.0 - 1.0 mg/dL   Nitrite POSITIVE (*) NEGATIVE   Leukocytes, UA MODERATE (*) NEGATIVE  WET PREP, GENITAL      Component Value Range   Yeast Wet Prep HPF POC NONE SEEN  NONE SEEN   Trich, Wet Prep NONE SEEN  NONE SEEN   Clue Cells Wet Prep HPF POC NONE SEEN  NONE SEEN   WBC, Wet Prep HPF POC NONE SEEN  NONE SEEN  POCT I-STAT, CHEM 8      Component Value Range   Sodium 140  135 - 145 mEq/L   Potassium 3.6  3.5 - 5.1 mEq/L   Chloride 103  96 - 112 mEq/L   BUN 11  6 - 23 mg/dL   Creatinine, Ser 1.47  0.50 - 1.10 mg/dL   Glucose, Bld 97  70 - 99 mg/dL   Calcium, Ion 8.29  5.62 - 1.32 mmol/L   TCO2 23  0 - 100 mmol/L   Hemoglobin 14.6  12.0 - 15.0 g/dL   HCT 13.0  86.5 - 78.4 %  URINE MICROSCOPIC-ADD ON      Component Value Range   RBC / HPF TOO NUMEROUS TO COUNT  <3 RBC/hpf   Bacteria, UA FEW (*) RARE     10:53 AM:  Will tx for UTI.  GC/chlam pending.  Pt improved after toradol and tylenol.  Wants to go home now.  Dx testing d/w pt and family.  Questions answered.  Verb understanding, agreeable to d/c home with outpt f/u.       I personally performed the services described in this documentation, which was scribed in my presence. The recorded information has been reviewed and considered. Aviannah Castoro Allison Quarry,  DO 08/10/11 1753

## 2011-08-09 NOTE — Discharge Instructions (Signed)
RESOURCE GUIDE  Chronic Pain Problems: Contact [facility]  [phone] Patients need to be referred by their primary care doctor.  Insufficient Money for Medicine: Contact United Way:  call "211" or [facility] [phone].  No Primary Care Doctor: - Call [facility]  [phone] - can help you locate a primary care doctor that  accepts your insurance, provides certain services, etc. - Physician Referral Service- 1-800-533-3463  Agencies that provide inexpensive medical care: - [facility]  [phone] - [facility]  [phone] - [facility]  [phone] - [facility] Clinic  [phone] - Planned Parenthood  [phone] - [facility]  [phone]  Medicaid-accepting [place] Providers: - [facility]- [address]  [phone], Mon-Fri 9am-7pm, Sat 9am-1pm - [facility]- [address], [address]  [phone] - [facility]- [address], [address]  [phone] - [facility]- [address] [place] [address]  [phone] - Veita Bland- [address], [address], [phone]  Only accepts [place] Access Medicaid patients after they have their name  applied to their card  Self Pay (no insurance) in [place]: - Sickle Cell Patients: Dr Eric Dean, [facility]  [address], [phone] - [facility]- [address]  [phone]       -     [facility]- [address]       -     [facility]- see information above (Speak to Pam H if you do not have insurance)       -  [facility]- [address], [phone]       -  [facility] [place]- [address],  [phone]       -  [facility]- [address] [place] [address], [phone]       -  Dr Osei-Bonsu-  [address], [place], [phone]       -  [facility]- [address], [phone]       -  [facility]- [address] [place] [address], [phone], also [address], [phone]       -    [facility]- [address], [phone], 1st & 3rd Saturday   every month, 10am-1pm  1) Find a Doctor and Pay Out of Pocket Although you won't have to find out who is covered by your insurance plan, it is a good idea to ask around and get recommendations. You will then need to call the office and see if the doctor you have chosen will accept you as a new patient and what types of options they offer for patients who are self-pay. Some doctors offer discounts or will set up payment plans for their patients who do not have insurance, but you will need to ask so you aren't surprised when you get to your appointment.  2) Contact Your Local Health Department Not all health departments have doctors that can see patients for sick visits, but many do, so it is worth a call to see if yours does. If you don't know where your local health department is, you can check in your phone book. The CDC also has a tool to help you locate your state's health department, and many state websites also have   listings of all of their local health departments.  3) Find a Walk-in Clinic If your illness is not likely to be very severe or complicated, you may want to try a walk in clinic. These are popping up all over the country in pharmacies, drugstores, and shopping centers. They're usually staffed by nurse practitioners or physician assistants that have been trained to treat common illnesses and complaints. They're usually fairly quick and inexpensive. However, if you have serious medical issues or chronic medical problems, these are probably not your best option  STD Testing - [place] [facility] [place], [facility], [address], [place], phone [phone] or 1-877-539-9860.  Monday - Friday, call for an appointment. - [place]  [facility] [place], [facility] Clinic, [address], [place], phone [phone] or 1-877-539-9860.  Monday - Friday, call for an appointment.  Abuse/Neglect: - [place] Child Abuse Hotline (336) 641-3795 - [place] Child Abuse Hotline 800-378-5315 (After Hours)  Emergency Shelter:  [facility] (336) 271-5985  Maternity Homes: - [facility] (336) 275-9566 - [facility] (704) 372-4663  MRSA Hotline #:   832-7006  [place] Resources  [facility] [place]  [employer] [place] [facility] [address]                 [address]         [address]  [place]                                               [place]                              [place] Phone:  [phone]                                  Phone:  [phone]                   Phone:  [phone]  [place] [facility], [phone] - [place] Services - [facility]- 1-888-581-9988       -     [facility] in [place], [address],                                  336-349-4454, Insurance  [place] Child Abuse Hotline (336) 342-1394 or (336) 342-3537 (After Hours)   Behavioral Health Services  Substance Abuse Resources: - [facility]  336-882-2125 - [facility] 336-784-9470 - [facility] 336-285-9073 - [facility] 336-845-3988 - Residential & Outpatient Substance Abuse Program  800-659-3381  Psychological Services: -  Health  [phone] - [facility]  [phone] - [place] [facility], [address], [place], ACCESS LINE: 1-800-853-5163 or 336-641-4981, Http://www.[facility].com/services/adult.htm  Dental Assistance  If unable to pay or uninsured, contact:  [facility] or [place] [facility] to become qualified for the adult dental  clinic.  Patients with Medicaid:  [facility] [address], [phone], [phone]  If unable   to pay, or uninsured, contact [facility] [phone]) or [facility] Department (520) [phone] in [address] [place], [phone] in [facility]) to become qualified for the adult dental clinic  Other Low-Cost Community Dental Services: - [facility]- [address] [place], [place], [place], [address], [phone], Ext. [phone], 2nd and 4th Thursday of the month at 6:30am.  10 clients each day by appointment, can sometimes see walk-in patients if someone does not show for an appointment. [facility]- [address] [place], [place], [address], [phone] - [facility]- [address] [place], [place], [address], [phone] [facility] Department- [phone] [facility] Health Department- [phone] [facility] Department269 [phone]     Take the prescriptions as directed.  Also take over the counter tylenol, as directed on packaging, as needed for pain.  Your gonorrhea and chlamydia culture is pending results, and you will receive a phone call in the next several days if it is positive.  .  Call your regular OB/GYN doctor on Monday to schedule a follow up appointment within the next week.  Return to the Emergency Department immediately if worsening.

## 2011-08-09 NOTE — ED Notes (Signed)
Patient with c/o pelvic pain x 4 days with vaginal bleeding. Has Mirena IUD, string still intact per patient. Reports moderate, dark red bleeding with clots.

## 2011-08-11 LAB — GC/CHLAMYDIA PROBE AMP, GENITAL: Chlamydia, DNA Probe: NEGATIVE

## 2011-08-19 ENCOUNTER — Other Ambulatory Visit: Payer: Self-pay | Admitting: Advanced Practice Midwife

## 2011-12-27 ENCOUNTER — Emergency Department (HOSPITAL_COMMUNITY): Payer: Medicaid Other

## 2011-12-27 ENCOUNTER — Emergency Department (HOSPITAL_COMMUNITY)
Admission: EM | Admit: 2011-12-27 | Discharge: 2011-12-27 | Disposition: A | Discharge status: Home / Self Care | Payer: Medicaid Other | Attending: Emergency Medicine | Admitting: Emergency Medicine

## 2011-12-27 ENCOUNTER — Encounter (HOSPITAL_COMMUNITY): Payer: Self-pay | Admitting: Emergency Medicine

## 2011-12-27 DIAGNOSIS — Z882 Allergy status to sulfonamides status: Secondary | ICD-10-CM | POA: Insufficient documentation

## 2011-12-27 DIAGNOSIS — M24419 Recurrent dislocation, unspecified shoulder: Secondary | ICD-10-CM

## 2011-12-27 DIAGNOSIS — F17200 Nicotine dependence, unspecified, uncomplicated: Secondary | ICD-10-CM | POA: Insufficient documentation

## 2011-12-27 HISTORY — DX: Recurrent dislocation, unspecified shoulder: M24.419

## 2011-12-27 MED ORDER — OXYCODONE-ACETAMINOPHEN 5-325 MG PO TABS: 1.0000 | ORAL_TABLET | Freq: Four times a day (QID) | ORAL | Status: DC | PRN

## 2011-12-27 MED ORDER — PROPOFOL 10 MG/ML IV BOLUS
0.5000 mg/kg | Freq: Once | INTRAVENOUS | Status: AC
  Administered 2011-12-27: 23:00:00 via INTRAVENOUS
  Filled 2011-12-27: qty 1

## 2011-12-27 MED ORDER — FENTANYL CITRATE 0.05 MG/ML IJ SOLN
50.0000 ug | Freq: Once | INTRAMUSCULAR | Status: AC
  Administered 2011-12-27: 23:00:00 via INTRAVENOUS
  Filled 2011-12-27: qty 2

## 2011-12-27 NOTE — ED Provider Notes (Signed)
History   This chart was scribed for [employer]. Rubin Payor, MD by Toya Smothers. The patient was seen in room APA16A/APA16A. Patient's care was started at 2149.  CSN: [id number]  Arrival date & time 12/27/11  2149   First MD Initiated Contact with Patient 12/27/11 2155      Chief Complaint  Patient presents with  . Shoulder Injury   The history is provided by the patient. No language interpreter was used.    [name] is a 26 y.o. female who presents to the Emergency Department complaining of 2 hours of recurrent, constant  R shoulder pain with obvious deformity. Pain is aggravated with ROM and consistent with previous occurences of shoulder displacement. She reports onset after reaching over a fence. Pt reports no improvement despite attempting to relocate the shoulder. Pain has not been treated PTA. No fever, chills, cough, congestion, rhinorrhea, chest pain, SOB, or n/v/d. Medical Hx includes recurrent shoulder dislocation. Pt is a current everyday smoker and admits the use of alcohol.    Past Medical History  Diagnosis Date  . Shoulder dislocation, recurrent     History reviewed. No pertinent past surgical history.  History reviewed. No pertinent family history.  History  Substance Use Topics  . Smoking status: Current Every Day Smoker  . Smokeless tobacco: Not on file  . Alcohol Use: Yes   Review of Systems  All other systems reviewed and are negative.    Allergies  Sulfa antibiotics  Home Medications  No current outpatient prescriptions on file.  BP 120/71  Pulse 99  Temp 97.6 F (36.4 C) (Oral)  Resp 20  Ht 5\' 4"  (1.626 m)  Wt 155 lb (70.308 kg)  BMI 26.61 kg/m2  SpO2 100%  Physical Exam  Constitutional: She is oriented to person, place, and time. She appears well-developed and well-nourished.  HENT:  Head: Normocephalic and atraumatic.  Cardiovascular: Normal rate, regular rhythm and normal heart sounds.   No murmur heard. Pulmonary/Chest: Effort  normal and breath sounds normal. No respiratory distress. She has no wheezes. She exhibits no tenderness.  Abdominal: Soft. There is no tenderness.  Musculoskeletal:       Fullness anteriorly. Pain with ROM of R shoulder. No tenderness over elbow. Axillary. Radial pulses intact. Right hand strength and ROM intact. Distally neurovascularly intact. Good cap refill.  Neurological: She is alert and oriented to person, place, and time. Coordination normal.  Skin: Skin is warm and dry. No rash noted.  Psychiatric: She has a normal mood and affect. Her behavior is normal.    ED Course  ORTHOPEDIC INJURY TREATMENT Date/Time: 12/27/2011 11:35 PM Performed by: Benjiman Core R. Authorized by: Billee Cashing Consent: Verbal consent obtained. Risks and benefits: risks, benefits and alternatives were discussed Consent given by: patient Patient understanding: patient states understanding of the procedure being performed Patient consent: the patient's understanding of the procedure matches consent given Procedure consent: procedure consent matches procedure scheduled Relevant documents: relevant documents present and verified Test results: test results available and properly labeled Imaging studies: imaging studies available Patient identity confirmed: verbally with patient, arm band and provided demographic data Time out: Immediately prior to procedure a "time out" was called to verify the correct patient, procedure, equipment, support staff and site/side marked as required. Injury location: shoulder Location details: right shoulder Injury type: dislocation Dislocation type: anterior Hill-Sachs deformity: noChronicity: recurrent Pre-procedure neurovascular assessment: neurovascularly intact Pre-procedure distal perfusion: normal Pre-procedure neurological function: normal Pre-procedure range of motion: reduced Local anesthesia used:  no Patient sedated: yes Vitals: Vital signs were  monitored during sedation. Manipulation performed: yes Reduction method: traction and counter traction Reduction successful: yes X-ray confirmed reduction: yes Immobilization: sling Post-procedure neurovascular assessment: post-procedure neurovascularly intact Post-procedure distal perfusion: normal Post-procedure neurological function: normal Post-procedure range of motion: improved Patient tolerance: Patient tolerated the procedure well with no immediate complications.   COORDINATION OF CARE: 22:00- Ordered Apply sling arm foam Once. 22:01- Ordered DG Shoulder Right 1 time imaging. 22:14- Evaluated Pt. Pt is awake, alert, and without distress. 22:18- Patient and family understand and agree with initial ED impression and plan with expectations set for ED visit.    Dg Shoulder Right  12/27/2011   *RADIOLOGY REPORT*  Clinical Data: Shoulder pain after injury.  RIGHT SHOULDER - 2+ VIEW  Comparison: 10/27/2010.  Findings: There is anterior dislocation of the right humeral head with respect to the glenoid.  No associated fractures are identified.  Coracoclavicular and acromioclavicular spaces appear intact.  IMPRESSION: Anterior right shoulder dislocation.   Original Report Authenticated By: Burman Nieves, M.D.     Procedural sedation Performed by: Billee Cashing Consent: Verbal consent obtained. Risks and benefits: risks, benefits and alternatives were discussed Required items: required blood products, implants, devices, and special equipment available Patient identity confirmed: arm band and provided demographic data Time out: Immediately prior to procedure a "time out" was called to verify the correct patient, procedure, equipment, support staff and site/side marked as required.  Sedation type: moderate (conscious) sedation NPO time confirmed and considedered  Sedatives: PROPOFOL  Physician Time at Bedside: 10 min  Vitals: Vital signs were monitored during sedation.  Cardiac Monitor, pulse oximeter Patient tolerance: Patient tolerated the procedure well with no immediate complications. Comments: Pt with uneventful recovered. Returned to pre-procedural sedation baseline     MDM  Patient with recurrent shoulder dislocation. Reduced under sedation. Improve pain. Neurovascular intact. Discharged.     I personally performed the services described in this documentation, which was scribed in my presence. The recorded information has been reviewed and considered.       Juliet Rude. Rubin Payor, MD 12/27/11 [phone]

## 2011-12-27 NOTE — ED Notes (Signed)
Patient complaining of right shoulder pain. Obvious deformity noted to right shoulder. Reports history of previous dislocations, states shoulder popped out and she attempted to pop it back into place, but could not.

## 2012-01-09 ENCOUNTER — Emergency Department (HOSPITAL_COMMUNITY)
Admission: EM | Admit: 2012-01-09 | Discharge: 2012-01-09 | Disposition: A | Discharge status: Home / Self Care | Payer: Medicaid Other | Attending: Emergency Medicine | Admitting: Emergency Medicine

## 2012-01-09 ENCOUNTER — Encounter (HOSPITAL_COMMUNITY): Payer: Self-pay | Admitting: *Deleted

## 2012-01-09 DIAGNOSIS — R509 Fever, unspecified: Secondary | ICD-10-CM | POA: Insufficient documentation

## 2012-01-09 DIAGNOSIS — R52 Pain, unspecified: Secondary | ICD-10-CM | POA: Insufficient documentation

## 2012-01-09 DIAGNOSIS — Z79899 Other long term (current) drug therapy: Secondary | ICD-10-CM | POA: Insufficient documentation

## 2012-01-09 DIAGNOSIS — J3489 Other specified disorders of nose and nasal sinuses: Secondary | ICD-10-CM | POA: Insufficient documentation

## 2012-01-09 DIAGNOSIS — Z87828 Personal history of other (healed) physical injury and trauma: Secondary | ICD-10-CM | POA: Insufficient documentation

## 2012-01-09 DIAGNOSIS — H9209 Otalgia, unspecified ear: Secondary | ICD-10-CM | POA: Insufficient documentation

## 2012-01-09 DIAGNOSIS — J029 Acute pharyngitis, unspecified: Secondary | ICD-10-CM | POA: Insufficient documentation

## 2012-01-09 MED ORDER — AMOXICILLIN 500 MG PO CAPS: 500.0000 mg | ORAL_CAPSULE | Freq: Three times a day (TID) | ORAL | Status: DC

## 2012-01-09 NOTE — ED Provider Notes (Signed)
Medical screening examination/treatment/procedure(s) were performed by non-physician practitioner and as supervising physician I was immediately available for consultation/collaboration.   Aliyanna Wassmer L Hady Niemczyk, MD 01/09/12 2326 

## 2012-01-09 NOTE — ED Notes (Signed)
Sore throat, body aches, bil ear ache,, chills

## 2012-01-09 NOTE — ED Provider Notes (Signed)
History     CSN: [id number]  Arrival date & time 01/09/12  1655   First MD Initiated Contact with Patient 01/09/12 1700      Chief Complaint  Patient presents with  . Sore Throat    (Consider location/radiation/quality/duration/timing/severity/associated sxs/prior treatment) HPI Comments: [name] presents with a 2 day history of sore throat, body aches,  Nasal congestion with clear rhinorrhea and bilateral ear pain.  She has had low grade fever as well and has taken tylenol with some improvement,  Her last dose taken around 10 am today.  She denies cough, shortness of breath and headache.  She has been exposed to her cousins child who has strep throat and also the patients grandmother,  Who was diagnosed with strep throat yesterday.  The history is provided by the patient.    Past Medical History  Diagnosis Date  . Shoulder dislocation, recurrent     History reviewed. No pertinent past surgical history.  History reviewed. No pertinent family history.  History  Substance Use Topics  . Smoking status: Never Smoker   . Smokeless tobacco: Not on file  . Alcohol Use: Yes    OB History    Grav Para Term Preterm Abortions TAB SAB Ect Mult Living                  Review of Systems  Constitutional: Positive for fever and chills.  HENT: Positive for ear pain, congestion, sore throat and rhinorrhea. Negative for hearing loss, trouble swallowing, neck pain, neck stiffness and voice change.   Eyes: Negative.   Respiratory: Negative for chest tightness and shortness of breath.   Cardiovascular: Negative for chest pain.  Gastrointestinal: Negative for nausea and abdominal pain.  Genitourinary: Negative.   Musculoskeletal: Negative for joint swelling and arthralgias.  Skin: Negative.  Negative for rash and wound.  Neurological: Negative for dizziness, weakness, light-headedness, numbness and headaches.  Hematological: Negative.   Psychiatric/Behavioral: Negative.      Allergies  Sulfa antibiotics  Home Medications   Current Outpatient Rx  Name  Route  Sig  Dispense  Refill  . ACETAMINOPHEN 500 MG PO TABS   Oral   Take 500 mg by mouth every 6 (six) hours as needed. For pain         . LEVONORGESTREL 20 MCG/24HR IU IUD   Intrauterine   1 each by Intrauterine route once.         . OXYCODONE-ACETAMINOPHEN 5-325 MG PO TABS   Oral   Take 1-2 tablets by mouth every 6 (six) hours as needed for pain.   10 tablet   0   . AMOXICILLIN 500 MG PO CAPS   Oral   Take 1 capsule (500 mg total) by mouth 3 (three) times daily.   30 capsule   0     BP 127/70  Pulse 108  Temp 100.2 F (37.9 C) (Oral)  Resp 20  Ht 5\' 4"  (1.626 m)  Wt 155 lb (70.308 kg)  BMI 26.61 kg/m2  SpO2 100%  Physical Exam  Constitutional: She is oriented to person, place, and time. She appears well-developed and well-nourished.  HENT:  Head: Normocephalic and atraumatic.  Right Ear: Ear canal normal. A middle ear effusion is present.  Left Ear: Ear canal normal. A middle ear effusion is present.  Nose: Mucosal edema and rhinorrhea present.  Mouth/Throat: Uvula is midline and mucous membranes are normal. Oropharyngeal exudate and posterior oropharyngeal erythema present. No posterior oropharyngeal edema or tonsillar  abscesses.  Eyes: Conjunctivae normal are normal.  Cardiovascular: Normal rate and normal heart sounds.   Pulmonary/Chest: Effort normal. No respiratory distress. She has no wheezes. She has no rales.  Abdominal: Soft. There is no tenderness.  Musculoskeletal: Normal range of motion.  Neurological: She is alert and oriented to person, place, and time.  Skin: Skin is warm and dry. No rash noted.  Psychiatric: She has a normal mood and affect.    ED Course  Procedures (including critical care time)   Labs Reviewed  RAPID STREP SCREEN   No results found.   1. Acute pharyngitis       MDM  Rapid strep result negative today but patient has exam  findings suggestive of bacterial strep throat and 2 family members undergoing active tx for strep throat.  Pt was placed on amoxil,  Encouraged continued tylenol, rest, fluid, menthol lozenges.          Burgess Amor, PA 01/09/12 [phone]

## 2012-06-28 ENCOUNTER — Emergency Department (HOSPITAL_COMMUNITY)
Admission: EM | Admit: 2012-06-28 | Discharge: 2012-06-28 | Disposition: A | Discharge status: Home / Self Care | Payer: Self-pay | Attending: Emergency Medicine | Admitting: Emergency Medicine

## 2012-06-28 ENCOUNTER — Encounter (HOSPITAL_COMMUNITY): Payer: Self-pay | Admitting: *Deleted

## 2012-06-28 ENCOUNTER — Emergency Department (HOSPITAL_COMMUNITY): Payer: Self-pay

## 2012-06-28 DIAGNOSIS — M25519 Pain in unspecified shoulder: Secondary | ICD-10-CM | POA: Insufficient documentation

## 2012-06-28 DIAGNOSIS — M25511 Pain in right shoulder: Secondary | ICD-10-CM

## 2012-06-28 DIAGNOSIS — Z8739 Personal history of other diseases of the musculoskeletal system and connective tissue: Secondary | ICD-10-CM | POA: Insufficient documentation

## 2012-06-28 NOTE — ED Provider Notes (Signed)
History    This chart was scribed for Benny Lennert, MD by Leone Payor, ED Scribe. This patient was seen in room APA02/APA02 and the patient's care was started 2:17 PM.   CSN: [id number]  Arrival date & time 06/28/12  1238   First MD Initiated Contact with Patient 06/28/12 1414      Chief Complaint  Patient presents with  . Shoulder Pain     Patient is a 27 y.o. female presenting with shoulder pain. The history is provided by the patient. No language interpreter was used.  Shoulder Pain This is a new problem. The current episode started 3 to 5 hours ago. The problem occurs constantly. The problem has been gradually improving. Pertinent negatives include no chest pain, no abdominal pain and no headaches.    [name] is a 27 y.o. female with h/o recurrent shoulder dislocations who presents to the Emergency Department complaining of mild R shoulder pain starting earlier today. States she tried to point at something behind her and it snapped out of place. States this is common and usually she is able to put it back in place, as she did today. Pt is an occasional alcohol user but denies smoking.    Past Medical History  Diagnosis Date  . Shoulder dislocation, recurrent     History reviewed. No pertinent past surgical history.  History reviewed. No pertinent family history.  History  Substance Use Topics  . Smoking status: Never Smoker   . Smokeless tobacco: Not on file  . Alcohol Use: Yes    OB History   Grav Para Term Preterm Abortions TAB SAB Ect Mult Living                  Review of Systems  Constitutional: Negative for appetite change and fatigue.  HENT: Negative for congestion, sinus pressure and ear discharge.   Eyes: Negative for discharge.  Respiratory: Negative for cough.   Cardiovascular: Negative for chest pain.  Gastrointestinal: Negative for abdominal pain and diarrhea.  Genitourinary: Negative for frequency and hematuria.  Musculoskeletal:  Positive for arthralgias. Negative for back pain.  Skin: Negative for rash.  Neurological: Negative for seizures and headaches.  Psychiatric/Behavioral: Negative for hallucinations.    Allergies  Sulfa antibiotics  Home Medications   Current Outpatient Rx  Name  Route  Sig  Dispense  Refill  . ibuprofen (ADVIL,MOTRIN) 200 MG tablet   Oral   Take 800 mg by mouth every 6 (six) hours as needed for pain.         Marland Kitchen levonorgestrel (MIRENA) 20 MCG/24HR IUD   Intrauterine   1 each by Intrauterine route once.         . sodium chloride (OCEAN) 0.65 % nasal spray   Nasal   Place 1 spray into the nose as needed for congestion.           BP 122/75  Pulse 89  Temp(Src) 97.2 F (36.2 C)  Resp 18  Ht 5\' 4"  (1.626 m)  Wt 165 lb (74.844 kg)  BMI 28.31 kg/m2  SpO2 100%  Physical Exam  Nursing note and vitals reviewed. Constitutional: She is oriented to person, place, and time. She appears well-developed.  HENT:  Head: Normocephalic.  Eyes: Conjunctivae are normal.  Neck: No tracheal deviation present.  Cardiovascular:  No murmur heard. Musculoskeletal: Normal range of motion. She exhibits tenderness.  Minimal tenderness in R shoulder.  Neurological: She is alert and oriented to person, place, and time. She  has normal strength. No cranial nerve deficit or sensory deficit.  Skin: Skin is warm.  Psychiatric: She has a normal mood and affect.    ED Course  Procedures (including critical care time)  DIAGNOSTIC STUDIES: Oxygen Saturation is 100% on room air, normal by my interpretation.    COORDINATION OF CARE: 2:21 PM Discussed treatment plan with pt at bedside and pt agreed to plan.   Labs Reviewed - No data to display Dg Shoulder Right  06/28/2012  *RADIOLOGY REPORT*  Clinical Data: Right shoulder pain. History of prior right shoulder dislocations.  RIGHT SHOULDER - 2+ VIEW  Comparison: 12/27/2011 and prior radiographs dating back to 07/24/2008  Findings: There is no  evidence of acute fracture, subluxation or dislocation. A small Hill-Sachs deformity is noted. There is no evidence of focal bony lesion. The visualized right bony thorax is unremarkable.  IMPRESSION: No evidence of acute abnormality.  Unchanged small Hill-Sachs deformity.   Original Report Authenticated By: Harmon Pier, M.D.      No diagnosis found.    MDM        The chart was scribed for me under my direct supervision.  I personally performed the history, physical, and medical decision making and all procedures in the evaluation of this patient.Benny Lennert, MD 06/28/12 [phone]

## 2012-06-28 NOTE — ED Notes (Signed)
Rt shoulder pain, hx of dislocation

## 2012-06-28 NOTE — ED Notes (Signed)
Patient with no complaints at this time. Respirations even and unlabored. Skin warm/dry. Discharge instructions reviewed with patient at this time. Patient given opportunity to voice concerns/ask questions. Patient discharged at this time and left Emergency Department with steady gait.   

## 2012-07-12 ENCOUNTER — Encounter (HOSPITAL_COMMUNITY): Payer: Self-pay | Admitting: *Deleted

## 2012-07-12 ENCOUNTER — Emergency Department (HOSPITAL_COMMUNITY)
Admission: EM | Admit: 2012-07-12 | Discharge: 2012-07-12 | Disposition: A | Discharge status: Home / Self Care | Payer: No Typology Code available for payment source | Attending: Emergency Medicine | Admitting: Emergency Medicine

## 2012-07-12 DIAGNOSIS — S59909A Unspecified injury of unspecified elbow, initial encounter: Secondary | ICD-10-CM | POA: Insufficient documentation

## 2012-07-12 DIAGNOSIS — S6990XA Unspecified injury of unspecified wrist, hand and finger(s), initial encounter: Secondary | ICD-10-CM | POA: Insufficient documentation

## 2012-07-12 DIAGNOSIS — Z8781 Personal history of (healed) traumatic fracture: Secondary | ICD-10-CM | POA: Insufficient documentation

## 2012-07-12 DIAGNOSIS — Y92410 Unspecified street and highway as the place of occurrence of the external cause: Secondary | ICD-10-CM | POA: Insufficient documentation

## 2012-07-12 DIAGNOSIS — IMO0002 Reserved for concepts with insufficient information to code with codable children: Secondary | ICD-10-CM | POA: Insufficient documentation

## 2012-07-12 DIAGNOSIS — Y9389 Activity, other specified: Secondary | ICD-10-CM | POA: Insufficient documentation
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
## 2012-07-12 NOTE — ED Notes (Signed)
Dr. Estell Harpin in room with pts.

## 2013-03-31 ENCOUNTER — Other Ambulatory Visit (HOSPITAL_COMMUNITY)
Admission: RE | Admit: 2013-03-31 | Discharge: 2013-03-31 | Disposition: A | Discharge status: Home / Self Care | Payer: Medicaid Other | Source: Ambulatory Visit | Attending: Obstetrics and Gynecology | Admitting: Obstetrics and Gynecology

## 2013-03-31 ENCOUNTER — Ambulatory Visit (INDEPENDENT_AMBULATORY_CARE_PROVIDER_SITE_OTHER): Payer: Medicaid Other | Admitting: Advanced Practice Midwife

## 2013-03-31 ENCOUNTER — Encounter: Payer: Self-pay | Admitting: Advanced Practice Midwife

## 2013-03-31 VITALS — BP 106/60 | Ht 63.5 in | Wt 161.0 lb

## 2013-03-31 DIAGNOSIS — Z975 Presence of (intrauterine) contraceptive device: Secondary | ICD-10-CM | POA: Insufficient documentation

## 2013-03-31 DIAGNOSIS — Z01419 Encounter for gynecological examination (general) (routine) without abnormal findings: Secondary | ICD-10-CM | POA: Insufficient documentation

## 2013-03-31 DIAGNOSIS — S43006A Unspecified dislocation of unspecified shoulder joint, initial encounter: Secondary | ICD-10-CM

## 2013-03-31 DIAGNOSIS — Z0000 Encounter for general adult medical examination without abnormal findings: Secondary | ICD-10-CM

## 2013-03-31 HISTORY — DX: Unspecified dislocation of unspecified shoulder joint, initial encounter: S43.006A

## 2013-03-31 MED ORDER — MEGESTROL ACETATE 40 MG PO TABS: ORAL_TABLET | ORAL | Status: DC

## 2013-03-31 NOTE — Progress Notes (Signed)
[name] 28 y.o.  Filed Vitals:   03/31/13 1422  BP: 106/60     Past Medical History: Past Medical History  Diagnosis Date  . Shoulder dislocation, recurrent     Past Surgical History: History reviewed. No pertinent past surgical history.  Family History: Family History  Problem Relation Age of Onset  . Diabetes Sister   . Cancer Paternal Grandmother     breast  . Asthma Son     Social History: History  Substance Use Topics  . Smoking status: Former Games developermoker  . Smokeless tobacco: Never Used  . Alcohol Use: Yes     Comment: occassional    Allergies:  Allergies  Allergen Reactions  . Sulfa Antibiotics Rash     History of Present Illness: Last pap 4 years ago.  Has had mirena for 4 years.  Has a lot of spotting, particulary after sex. Current outpatient prescriptions:ibuprofen (ADVIL,MOTRIN) 200 MG tablet, Take 800 mg by mouth every 6 (six) hours as needed for pain., Disp: , Rfl: ;  levonorgestrel (MIRENA) 20 MCG/24HR IUD, 1 each by Intrauterine route once., Disp: , Rfl: ;  sodium chloride (OCEAN) 0.65 % nasal spray, Place 1 spray into the nose as needed for congestion., Disp: , Rfl: ;  megestrol (MEGACE) 40 MG tablet, Take 2-3 PO daily prn bleeding, Disp: 90 tablet, Rfl: 3    Review of Systems   Constitutional: Negative for fever and chills Eyes: Negative for visual disturbances Respiratory: Negative for shortness of breath, dyspnea Cardiovascular: Negative for chest pain or palpitations  Gastrointestinal: Negative for vomiting, diarrhea  POSTITVE for intermittent abdominal pain/constipation for years. Used to eat a lot of flour Genitourinary: Negative for dysuria and urgency Musculoskeletal: Negative for back pain, joint pain, myalgias  Neurological: Negative for dizziness and headaches    Physical Exam: General:  Well developed, well nourished, no acute distress Skin:  Warm and dry Neck:  Midline trachea, normal thyroid Lungs; Clear to  auscultation bilaterally Breast:  No dominant palpable mass, retraction, or nipple discharge Cardiovascular: Regular rate and rhythm Abdomen:  Soft, non tender, no hepatosplenomegaly Pelvic:  External genitalia is normal in appearance.  The vagina is normal in appearance.               The cervix is bulbous and non friable.  Small amt of dark blood coming from os.  Strings visible Uterus is felt to be normal size, shape, and contour.  No   adnexal masses or tenderness noted. Extremities:  No swelling or varicosities noted Psych:  No mood changes   Impression:   Normal GYN exam DUB 2/2 IUD Abdominal pain of ? origin  Plan:  Fasting Lipids, BMP, TSH (screening) Megace prn bleeding GI referral for GI issues

## 2013-04-27 ENCOUNTER — Other Ambulatory Visit: Payer: Self-pay | Admitting: *Deleted

## 2013-04-27 ENCOUNTER — Telehealth: Payer: Self-pay | Admitting: Advanced Practice Midwife

## 2013-04-27 NOTE — Telephone Encounter (Signed)
Pt informed that on her visit with [name] on 03/31/13 Megace was rx'd and [facility].  Pt states that is fine [facility], she just couldn't remember what pharmacy she told us to send it to.

## 2013-05-09 ENCOUNTER — Encounter (INDEPENDENT_AMBULATORY_CARE_PROVIDER_SITE_OTHER): Payer: Self-pay

## 2013-05-09 ENCOUNTER — Other Ambulatory Visit: Payer: Medicaid Other

## 2013-05-09 DIAGNOSIS — Z13220 Encounter for screening for lipoid disorders: Secondary | ICD-10-CM

## 2013-05-09 DIAGNOSIS — Z0000 Encounter for general adult medical examination without abnormal findings: Secondary | ICD-10-CM

## 2013-05-09 DIAGNOSIS — Z1329 Encounter for screening for other suspected endocrine disorder: Secondary | ICD-10-CM

## 2013-05-09 LAB — BASIC METABOLIC PANEL
BUN: 11 mg/dL (ref 6–23)
CHLORIDE: 103 meq/L (ref 96–112)
CO2: 28 meq/L (ref 19–32)
CREATININE: 0.75 mg/dL (ref 0.50–1.10)
Calcium: 9.2 mg/dL (ref 8.4–10.5)
Glucose, Bld: 89 mg/dL (ref 70–99)
Potassium: 4.3 mEq/L (ref 3.5–5.3)
Sodium: 139 mEq/L (ref 135–145)

## 2013-05-09 LAB — LIPID PANEL
CHOLESTEROL: 160 mg/dL (ref 0–200)
HDL: 43 mg/dL (ref >39)
LDL CALC: 107 mg/dL — AB (ref 0–99)
TRIGLYCERIDES: 51 mg/dL (ref <150)
Total CHOL/HDL Ratio: 3.7 Ratio
VLDL: 10 mg/dL (ref 0–40)

## 2013-05-10 LAB — TSH: TSH: 0.371 u[IU]/mL (ref 0.350–4.500)

## 2013-12-06 ENCOUNTER — Encounter: Payer: Self-pay | Admitting: Adult Health

## 2013-12-06 ENCOUNTER — Ambulatory Visit (INDEPENDENT_AMBULATORY_CARE_PROVIDER_SITE_OTHER): Payer: Medicaid Other | Admitting: Adult Health

## 2013-12-06 VITALS — BP 108/60 | Ht 64.0 in | Wt 177.5 lb

## 2013-12-06 DIAGNOSIS — Z30013 Encounter for initial prescription of injectable contraceptive: Secondary | ICD-10-CM

## 2013-12-06 DIAGNOSIS — Z309 Encounter for contraceptive management, unspecified: Secondary | ICD-10-CM | POA: Insufficient documentation

## 2013-12-06 MED ORDER — MEDROXYPROGESTERONE ACETATE 150 MG/ML IM SUSP: 150.0000 mg | INTRAMUSCULAR | Status: DC

## 2013-12-06 NOTE — Progress Notes (Signed)
Subjective:     Patient ID: [name], female   DOB: 09/16/1985, 28 y.o.   MRN: [id number]  HPI [name] is in for IUD removal, but it is still good til 01/24/14, so she will leave it in and we discussed other birth control options and she chooses depo provera.She says stomach sore since having daughter ?muscle related.  Review of Systems See HPI Reviewed past medical,surgical, social and family history. Reviewed medications and allergies.     Objective:   Physical Exam BP 108/60  Ht 5\' 4"  (1.626 m)  Wt 177 lb 8 oz (80.513 kg)  BMI 30.45 kg/m2  LMP 11/27/2013   Skin warm and dry.Pelvic: external genitalia is normal in appearance, vagina:has good color,moisture and rugae, cervix: bulbous with +IUD strings, uterus: normal size, shape and contour, mildly  tender, no masses felt, adnexa: no masses or tenderness noted. Will Rx depo and give it then come back in 4 weeks after that for IUD removal.  Assessment:     Contraceptive management    Plan:    Rx depo provera 150 mg # 1 for Im injection in office with 4 refills Return in 1 week for depo then in 5 weeks for IUD removal with me Review handout on depo

## 2013-12-06 NOTE — Patient Instructions (Signed)

## 2013-12-13 ENCOUNTER — Encounter: Payer: Self-pay | Admitting: Adult Health

## 2013-12-13 ENCOUNTER — Ambulatory Visit (INDEPENDENT_AMBULATORY_CARE_PROVIDER_SITE_OTHER): Payer: Medicaid Other | Admitting: Adult Health

## 2013-12-13 DIAGNOSIS — Z3202 Encounter for pregnancy test, result negative: Secondary | ICD-10-CM

## 2013-12-13 DIAGNOSIS — Z3042 Encounter for surveillance of injectable contraceptive: Secondary | ICD-10-CM

## 2013-12-13 LAB — POCT URINE PREGNANCY: Preg Test, Ur: NEGATIVE

## 2013-12-13 MED ORDER — MEDROXYPROGESTERONE ACETATE 150 MG/ML IM SUSP
150.0000 mg | Freq: Once | INTRAMUSCULAR | Status: AC
Start: 2013-12-13 — End: 2013-12-13
  Administered 2013-12-13: 150 mg via INTRAMUSCULAR

## 2013-12-26 ENCOUNTER — Encounter: Payer: Self-pay | Admitting: Adult Health

## 2014-01-10 ENCOUNTER — Ambulatory Visit: Payer: Medicaid Other | Admitting: Adult Health

## 2014-01-18 ENCOUNTER — Ambulatory Visit (INDEPENDENT_AMBULATORY_CARE_PROVIDER_SITE_OTHER): Payer: Medicaid Other | Admitting: Adult Health

## 2014-01-18 ENCOUNTER — Encounter: Payer: Self-pay | Admitting: Adult Health

## 2014-01-18 VITALS — BP 102/56 | Ht 64.0 in | Wt 180.0 lb

## 2014-01-18 DIAGNOSIS — B9689 Other specified bacterial agents as the cause of diseases classified elsewhere: Secondary | ICD-10-CM

## 2014-01-18 DIAGNOSIS — Z30432 Encounter for removal of intrauterine contraceptive device: Secondary | ICD-10-CM

## 2014-01-18 DIAGNOSIS — A499 Bacterial infection, unspecified: Secondary | ICD-10-CM

## 2014-01-18 DIAGNOSIS — N898 Other specified noninflammatory disorders of vagina: Secondary | ICD-10-CM | POA: Insufficient documentation

## 2014-01-18 DIAGNOSIS — N760 Acute vaginitis: Secondary | ICD-10-CM | POA: Insufficient documentation

## 2014-01-18 LAB — POCT WET PREP (WET MOUNT): WBC, Wet Prep HPF POC: POSITIVE

## 2014-01-18 MED ORDER — METRONIDAZOLE 500 MG PO TABS: 500.0000 mg | ORAL_TABLET | Freq: Two times a day (BID) | ORAL | Status: DC

## 2014-01-18 NOTE — Progress Notes (Signed)
Subjective:     Patient ID: [name], female   DOB: 07/10/1985, 28 y.o.   MRN: [id number]  HPI [name] is a 28 year old black female, married in for IUD removal.She has already gotten depo.She complains of a vaginal discharge, thinks it is BV, took antibiotics and then diflucan.  Review of Systems See HPI Reviewed past medical,surgical, social and family history. Reviewed medications and allergies.     Objective:   Physical Exam BP 102/56 mmHg  Ht 5\' 4"  (1.626 m)  Wt 180 lb (81.647 kg)  BMI 30.88 kg/m2   Skin warm and dry.Pelvic: external genitalia is normal in appearance, vagina: white discharge with odor, cervix:smooth and bulbous, IUD easily removed, uterus: normal size, shape and contour, non tender, no masses felt, adnexa: no masses or tenderness noted. Wet prep: + for clue cells and +WBCs.  Assessment:     IUD removal Vaginal discharge BV    Plan:     Rx flagyl 500 mg 1 bid x 7 days, no alcohol, review handout on BV  Return in January for next depo

## 2014-01-18 NOTE — Patient Instructions (Signed)
Bacterial Vaginosis Bacterial vaginosis is a vaginal infection that occurs when the normal balance of bacteria in the vagina is disrupted. It results from an overgrowth of certain bacteria. This is the most common vaginal infection in women of childbearing age. Treatment is important to prevent complications, especially in pregnant women, as it can cause a premature delivery. CAUSES  Bacterial vaginosis is caused by an increase in harmful bacteria that are normally present in smaller amounts in the vagina. Several different kinds of bacteria can cause bacterial vaginosis. However, the reason that the condition develops is not fully understood. RISK FACTORS Certain activities or behaviors can put you at an increased risk of developing bacterial vaginosis, including:  Having a new sex partner or multiple sex partners.  Douching.  Using an intrauterine device (IUD) for contraception. Women do not get bacterial vaginosis from toilet seats, bedding, swimming pools, or contact with objects around them. SIGNS AND SYMPTOMS  Some women with bacterial vaginosis have no signs or symptoms. Common symptoms include:  Grey vaginal discharge.  A fishlike odor with discharge, especially after sexual intercourse.  Itching or burning of the vagina and vulva.  Burning or pain with urination. DIAGNOSIS  Your health care provider will take a medical history and examine the vagina for signs of bacterial vaginosis. A sample of vaginal fluid may be taken. Your health care provider will look at this sample under a microscope to check for bacteria and abnormal cells. A vaginal pH test may also be done.  TREATMENT  Bacterial vaginosis may be treated with antibiotic medicines. These may be given in the form of a pill or a vaginal cream. A second round of antibiotics may be prescribed if the condition comes back after treatment.  HOME CARE INSTRUCTIONS   Only take over-the-counter or prescription medicines as  directed by your health care provider.  If antibiotic medicine was prescribed, take it as directed. Make sure you finish it even if you start to feel better.  Do not have sex until treatment is completed.  Tell all sexual partners that you have a vaginal infection. They should see their health care provider and be treated if they have problems, such as a mild rash or itching.  Practice safe sex by using condoms and only having one sex partner. SEEK MEDICAL CARE IF:   Your symptoms are not improving after 3 days of treatment.  You have increased discharge or pain.  You have a fever. MAKE SURE YOU:   Understand these instructions.  Will watch your condition.  Will get help right away if you are not doing well or get worse. FOR MORE INFORMATION  Centers for Disease Control and Prevention, Division of STD Prevention: SolutionApps.co.zawww.cdc.gov/std American Sexual Health Association (ASHA): www.ashastd.org  Document Released: 02/10/2005 Document Revised: 12/01/2012 Document Reviewed: 09/22/2012 [facility] Patient Information 2015 [employer] [place]. This information is not intended to replace advice given to you by your health care provider. Make sure you discuss any questions you have with your health care provider. Take flagyl no alcohol Return for depo provera

## 2014-03-06 ENCOUNTER — Telehealth: Payer: Self-pay | Admitting: Adult Health

## 2014-03-06 NOTE — Telephone Encounter (Signed)
Left message letting pt know she has refills on Depo. Pharmacy is going to fill med. Reminded pt to pick up med at pharmacy before coming to appt. JSY

## 2014-03-07 ENCOUNTER — Encounter: Payer: Self-pay | Admitting: Adult Health

## 2014-03-07 ENCOUNTER — Encounter: Payer: Self-pay | Admitting: *Deleted

## 2014-03-07 ENCOUNTER — Ambulatory Visit (INDEPENDENT_AMBULATORY_CARE_PROVIDER_SITE_OTHER): Payer: Medicaid Other | Admitting: Adult Health

## 2014-03-07 DIAGNOSIS — Z3042 Encounter for surveillance of injectable contraceptive: Secondary | ICD-10-CM

## 2014-03-07 DIAGNOSIS — Z3202 Encounter for pregnancy test, result negative: Secondary | ICD-10-CM

## 2014-03-07 LAB — POCT URINE PREGNANCY: PREG TEST UR: NEGATIVE

## 2014-03-07 MED ORDER — MEDROXYPROGESTERONE ACETATE 150 MG/ML IM SUSP
150.0000 mg | Freq: Once | INTRAMUSCULAR | Status: AC
  Administered 2014-03-07: 150 mg via INTRAMUSCULAR

## 2014-05-30 ENCOUNTER — Ambulatory Visit (INDEPENDENT_AMBULATORY_CARE_PROVIDER_SITE_OTHER): Payer: Medicaid Other | Admitting: *Deleted

## 2014-05-30 ENCOUNTER — Ambulatory Visit: Payer: Medicaid Other

## 2014-05-30 DIAGNOSIS — Z3202 Encounter for pregnancy test, result negative: Secondary | ICD-10-CM | POA: Diagnosis not present

## 2014-05-30 DIAGNOSIS — Z3200 Encounter for pregnancy test, result unknown: Secondary | ICD-10-CM

## 2014-05-30 DIAGNOSIS — Z3042 Encounter for surveillance of injectable contraceptive: Secondary | ICD-10-CM | POA: Diagnosis not present

## 2014-05-30 LAB — POCT URINE PREGNANCY: PREG TEST UR: NEGATIVE

## 2014-05-30 MED ORDER — MEDROXYPROGESTERONE ACETATE 150 MG/ML IM SUSP
150.0000 mg | Freq: Once | INTRAMUSCULAR | Status: AC
  Administered 2014-05-30: 150 mg via INTRAMUSCULAR

## 2014-05-30 NOTE — Progress Notes (Signed)
Patient ID: [name], female   DOB: 12/20/1985, 29 y.o.   MRN: [id number] Pt here today for DEPO injection. Pt denies any problems or concerns at this time. UPT is negative and pt tolerated injection well.

## 2014-08-22 ENCOUNTER — Ambulatory Visit (INDEPENDENT_AMBULATORY_CARE_PROVIDER_SITE_OTHER): Payer: Medicaid Other | Admitting: *Deleted

## 2014-08-22 ENCOUNTER — Ambulatory Visit: Payer: Medicaid Other

## 2014-08-22 ENCOUNTER — Encounter: Payer: Self-pay | Admitting: *Deleted

## 2014-08-22 DIAGNOSIS — Z3042 Encounter for surveillance of injectable contraceptive: Secondary | ICD-10-CM | POA: Diagnosis not present

## 2014-08-22 DIAGNOSIS — Z3202 Encounter for pregnancy test, result negative: Secondary | ICD-10-CM | POA: Diagnosis not present

## 2014-08-22 LAB — POCT URINE PREGNANCY: Preg Test, Ur: NEGATIVE

## 2014-08-22 MED ORDER — MEDROXYPROGESTERONE ACETATE 150 MG/ML IM SUSP
150.0000 mg | Freq: Once | INTRAMUSCULAR | Status: AC
  Administered 2014-08-22: 150 mg via INTRAMUSCULAR

## 2014-08-22 NOTE — Progress Notes (Signed)
Pt here for Depo. Reports no problems at this time. Return in 12 weeks for next shot. JSY 

## 2014-10-27 ENCOUNTER — Encounter (HOSPITAL_COMMUNITY): Payer: Self-pay | Admitting: Emergency Medicine

## 2014-10-27 ENCOUNTER — Emergency Department (HOSPITAL_COMMUNITY): Payer: Medicaid Other

## 2014-10-27 ENCOUNTER — Emergency Department (HOSPITAL_COMMUNITY)
Admission: EM | Admit: 2014-10-27 | Discharge: 2014-10-27 | Disposition: A | Discharge status: Home / Self Care | Payer: Medicaid Other | Attending: Physician Assistant | Admitting: Physician Assistant

## 2014-10-27 DIAGNOSIS — Z8742 Personal history of other diseases of the female genital tract: Secondary | ICD-10-CM | POA: Insufficient documentation

## 2014-10-27 DIAGNOSIS — Z8619 Personal history of other infectious and parasitic diseases: Secondary | ICD-10-CM | POA: Insufficient documentation

## 2014-10-27 DIAGNOSIS — Z87891 Personal history of nicotine dependence: Secondary | ICD-10-CM | POA: Diagnosis not present

## 2014-10-27 DIAGNOSIS — X58XXXA Exposure to other specified factors, initial encounter: Secondary | ICD-10-CM | POA: Insufficient documentation

## 2014-10-27 DIAGNOSIS — Y9389 Activity, other specified: Secondary | ICD-10-CM | POA: Diagnosis not present

## 2014-10-27 DIAGNOSIS — S43004A Unspecified dislocation of right shoulder joint, initial encounter: Secondary | ICD-10-CM | POA: Insufficient documentation

## 2014-10-27 DIAGNOSIS — Y998 Other external cause status: Secondary | ICD-10-CM | POA: Diagnosis not present

## 2014-10-27 DIAGNOSIS — Y9289 Other specified places as the place of occurrence of the external cause: Secondary | ICD-10-CM | POA: Diagnosis not present

## 2014-10-27 DIAGNOSIS — S4991XA Unspecified injury of right shoulder and upper arm, initial encounter: Secondary | ICD-10-CM | POA: Diagnosis present

## 2014-10-27 DIAGNOSIS — S43006A Unspecified dislocation of unspecified shoulder joint, initial encounter: Secondary | ICD-10-CM

## 2014-10-27 MED ORDER — IBUPROFEN 800 MG PO TABS: 800.0000 mg | ORAL_TABLET | Freq: Three times a day (TID) | ORAL | Status: DC

## 2014-10-27 MED ORDER — ACETAMINOPHEN 325 MG PO TABS
650.0000 mg | ORAL_TABLET | Freq: Once | ORAL | Status: AC
  Administered 2014-10-27: 650 mg via ORAL
  Filled 2014-10-27: qty 2

## 2014-10-27 NOTE — Discharge Instructions (Signed)
Resolved with her shoulder doctor as planned.  Shoulder Dislocation Your shoulder is made up of three bones: the collar bone (clavicle); the shoulder blade (scapula), which includes the socket (glenoid cavity); and the upper arm bone (humerus). Your shoulder joint is the place where these bones meet. Strong, fibrous tissues hold these bones together (ligaments). Muscles and strong, fibrous tissues that connect the muscles to these bones (tendons) allow your arm to move through this joint. The range of motion of your shoulder joint is more extensive than most of your other joints, and the glenoid cavity is very shallow. That is the reason that your shoulder joint is one of the most unstable joints in your body. It is far more prone to dislocation than your other joints. Shoulder dislocation is when your humerus is forced out of your shoulder joint. CAUSES Shoulder dislocation is caused by a forceful impact on your shoulder. This impact usually is from an injury, such as a sports injury or a fall. SYMPTOMS Symptoms of shoulder dislocation include:  Deformity of your shoulder.  Intense pain.  Inability to move your shoulder joint.  Numbness, weakness, or tingling around your shoulder joint (your neck or down your arm).  Bruising or swelling around your shoulder. DIAGNOSIS In order to diagnose a dislocated shoulder, your caregiver will perform a physical exam. Your caregiver also may have an X-ray exam done to see if you have any broken bones. Magnetic resonance imaging (MRI) is a procedure that sometimes is done to help your caregiver see any damage to the soft tissues around your shoulder, particularly your rotator cuff tendons. Additionally, your caregiver also may have electromyography done to measure the electrical discharges produced in your muscles if you have signs or symptoms of nerve damage. TREATMENT A shoulder dislocation is treated by placing the humerus back in the joint (reduction).  Your caregiver does this either manually (closed reduction), by moving your humerus back into the joint through manipulation, or through surgery (open reduction). When your humerus is back in place, severe pain should improve almost immediately. You also may need to have surgery if you have a weak shoulder joint or ligaments, and you have recurring shoulder dislocations, despite rehabilitation. In rare cases, surgery is necessary if your nerves or blood vessels are damaged during the dislocation. After your reduction, your arm will be placed in a shoulder immobilizer or sling to keep it from moving. Your caregiver will have you wear your shoulder immobilizer or sling for 3 days to 3 weeks, depending on how serious your dislocation is. When your shoulder immobilizer or sling is removed, your caregiver may prescribe physical therapy to help improve the range of motion in your shoulder joint. HOME CARE INSTRUCTIONS  The following measures can help to reduce pain and speed up the healing process:  Rest your injured joint. Do not move it. Avoid activities similar to the one that caused your injury.  Apply ice to your injured joint for the first day or two after your reduction or as directed by your caregiver. Applying ice helps to reduce inflammation and pain.  Put ice in a plastic bag.  Place a towel between your skin and the bag.  Leave the ice on for 15-20 minutes at a time, every 2 hours while you are awake.  Exercise your hand by squeezing a soft ball. This helps to eliminate stiffness and swelling in your hand and wrist.  Take over-the-counter or prescription medicine for pain or discomfort as told by your caregiver.  SEEK IMMEDIATE MEDICAL CARE IF:   Your shoulder immobilizer or sling becomes damaged.  Your pain becomes worse rather than better.  You lose feeling in your arm or hand, or they become white and cold. MAKE SURE YOU:   Understand these instructions.  Will watch your  condition.  Will get help right away if you are not doing well or get worse. Document Released: 11/05/2000 Document Revised: 06/27/2013 Document Reviewed: 12/01/2010 [facility] Patient Information 2015 [place], [place]. This information is not intended to replace advice given to you by your health care provider. Make sure you discuss any questions you have with your health care provider.  Shoulder Instability, Multidirectional  with Rehab Anterior shoulder instability is a condition that is characterized by recurrent dislocation or partial dislocation (subluxation) of the shoulder joint. Dislocation is an injury in which two adjacent bones are no longer in proper alignment, and the joint surfaces are no longer touching. Subluxation is a similar injury to dislocation; however, the joint surfaces are still touching. With multidirectional shoulder instability, dislocations and subluxations of the shoulder joint (glenohumeral) involve the upper arm bone (humerus) displacing forward (anteriorly), downward (inferiorly), or backward (posteriorly). The shoulder joint allows more motion than any other joint in the body, and because of this it is highly susceptible to injury. When the glenohumeral joint is dislocated or subluxed, the muscles that control the shoulder joint (rotator cuff) tendons become stretched. Repetitive injury results in the shoulder joint becoming loose and results in instability of the shoulder joint. These injuries may also cause a tear in the labrum (cartilaginous rim) that lines the joint and helps keep the humerus head in place. SYMPTOMS   Severe shoulder pain when the joint is dislocated or subluxed.  Shoulder weakness, pain, and/or inflammation.  Loss of shoulder function.  Pain that worsens with shoulder function, especially motions that involve arm movements above shoulder height.  Feeling of shoulder weakness or instability.  Signs of nerve damage: numbness or  paralysis.  Crackling (crepitation) feeling and sound when the injured area is touched or with shoulder motion.  Often occurs in both shoulders. CAUSES  Multidirectional shoulder instability is caused by injury to the glenohumeral joint that causes it to become dislocated or subluxed. Common mechanisms of injury include:  Microtraumatic or atraumatic (most common).  Direct trauma to the shoulder joint.  Repetitive and/or strenuous movements of the shoulder joint, especially those with the arm above shoulder height.  Sprain of on the ligaments of the shoulder joint.  A shallow or malformed joint surface you are born with (congenital). RISK INCREASES WITH:  Contact sports (football, wrestling, and basketball).  Activities that involve repetitive and/or strenuous movements of the shoulder joint, especially those with the arm above shoulder height (baseball, volleyball, or swimming).  Previous shoulder injury.  Poor strength and flexibility.  Congenital abnormality (shallow or malformed joint surface). PREVENTION   Warm up and stretch properly before activity.  Allow for adequate recovery between workouts.  Maintain physical fitness:  Strength, flexibility, and endurance.  Cardiovascular fitness.  Learn and use proper technique. When possible, have coach correct improper technique.  Wear properly fitted and padded protective equipment. PROGNOSIS The extent of recovery and likelihood of future dislocations and subluxations depends on the extent of damage done to the shoulder. Reoccurrence of symptoms is likely for individuals with multidirectional shoulder instability. RELATED COMPLICATIONS   Damage to the nervous system or blood vessels that may cause weakness, paralysis, numbness, coldness, and paleness.  Damage to the bones  or cartilage of the shoulder joint.  Permanent shoulder instability.  Tear of one or more of the rotator cuff tendons.  Arthritis of the  shoulder. TREATMENT  When the shoulder joint is dislocated it must be reduced (the bones realigned) by someone who is trained in the procedure. Occasionally reduction cannot be performed manually, and requires surgery. After reduction, the use of ice and medication may help reduce pain and inflammation. The shoulder should be immobilized with a sling for 3 to 8 weeks to allow the joint to heal. After immobilization it is important to perform strengthening and stretching exercises to help regain strength and a full range of motion. These exercises may be completed at home or with a therapist. Surgery is reserved for individuals who have sustained multiple shoulder dislocations due to shoulder instability. MEDICATION   General anesthesia or muscle relaxants may be necessary for reduction of the shoulder joint.  If pain medication is necessary, then nonsteroidal anti-inflammatory medications, such as aspirin and ibuprofen, or other minor pain relievers, such as acetaminophen, are often recommended.  Do not take pain medication for 7 days before surgery.  Prescription pain relievers may be given if deemed necessary by your caregiver. Use only as directed and only as much as you need. COLD THERAPY  Cold treatment (icing) relieves pain and reduces inflammation. Cold treatment should be applied for 10 to 15 minutes every 2 to 3 hours for inflammation and pain and immediately after any activity that aggravates your symptoms. Use ice packs or massage the area with a piece of ice (ice massage). SEEK MEDICAL CARE IF:  Treatment seems to offer no benefit, or the condition worsens.  Any medications produce adverse side effects.  Any complications from surgery occur:  Pain, numbness, or coldness in the extremity operated upon.  Discoloration of the nail beds (they become blue or gray) of the extremity operated upon.  Signs of infections (fever, pain, inflammation, redness, or persistent  bleeding). EXERCISES RANGE OF MOTION (ROM) AND STRETCHING EXERCISES - Shoulder Instability, Multidirectional These exercises may help you restore your shoulder mobility once your physician has discontinued your 3-8 week immobilization period. The length of your immobilization depends on the intensity of your injury and the quality of the tissues before they were repaired. While completing these exercises, remember:   Restoring tissue flexibility helps normal motion to return to the joints. This allows healthier, less painful movement and activity.  An effective stretch should be held for at least 30 seconds.  A stretch should never be painful. You should only feel a gentle lengthening or release in the stretched tissue. During your recovery, avoid activity or exercises which involve actions that place your right / left hand or elbow above your head or behind your back or head. These positions stress the tissues which are trying to heal. ROM - Pendulum  Bend at the waist so that your right / left arm falls away from your body. Support yourself with your opposite hand on a solid surface, such as a table or a countertop.  Your right / left arm should be perpendicular to the ground. If it is not perpendicular, you need to lean over farther. Relax the muscles in your right / leftarm and shoulder as much as possible.  Gently sway your hips and trunk so they move your right / leftarm without any use of your right / left shoulder muscles.  Progress your movements so that your right / left arm moves side to side, then forward  and backward, and finally, both clockwise and counterclockwise.  Complete __________ repetitions in each direction. Many people use this exercise to relieve discomfort in their shoulder as well as to gain range of motion. Repeat __________ times. Complete this exercise __________ times per day. ROM - Flexion, Active-Assisted  Lie on your back. You may bend your knees for  comfort.  Grasp a broomstick or cane so your hands are about shoulder-width apart. Your right / left hand should grip the end of the stick/cane so that your hand is positioned "thumbs-up," as if you were about to shake hands.  Using your healthy arm to lead, raise your right / left arm overhead until you feel a gentle stretch in your shoulder. Hold __________ seconds.  Use the stick/cane to assist in returning your right / left arm to its starting position. Repeat __________ times. Complete this exercise __________ times per day.  ROM - Internal Rotation, Supine  Lie on your back on a firm surface. Place your right / left elbow about 60 degrees away from your side. Elevate your elbow with a folded towel so that the elbow and shoulder are the same height.  Using a broomstick or cane and your strong arm, pull your right / left hand toward your body until you feel a gentle stretch, but no increase in your shoulder pain. Keep your shoulder and elbow in place throughout the exercise.  Hold __________. Slowly return to the starting position. Repeat __________ times. Complete this exercise __________ times per day. STRETCH - External Rotation  Tuck a folded towel or small ball under your right / left upper arm. Grasp a broomstick or cane with an underhand grasp a little more than shoulder width apart. Bend your elbows to 90 degrees.  Stand with good posture or sit in a firm chair without arms.  Use your strong arm to push the stick across your body. Do not allow the towel or ball to fall. This will rotate your right / left arm away from your abdomen. Using the stick turn/rotate your hand and forearm away from your body. Hold __________ seconds. Repeat __________ times. Complete this exercise __________ times per day.  STRETCH - Flexion, Seated  Sit in a firm chair so that your right / left forearm can rest on a table or on a table or countertop. Your right / left elbow should rest below the height  of your shoulder so that your shoulder feels supported and not tense or uncomfortable.  Keeping your right / left shoulder relaxed, lean forward at your waist, allowing your right / left hand to slide forward. Bend forward until you feel a moderate stretch in your shoulder, but before you feel an increase in your pain.  Hold __________ seconds. Slowly return to your starting position. Repeat __________ times. Complete this exercise __________ times per day.  STRETCH - Flexion, Standing  Stand facing a wall. Walk your right / left fingers up the wall until you feel a moderate stretch in your shoulder. As your hand gets higher, you may need to step closer to the wall or use a door frame to walk through.  Try to avoid shrugging your right / left shoulder as your arm rises by keeping your shoulder blade tucked down and toward your mid-back spine.  Hold __________ seconds. Use your other hand, if needed, to ease out of the stretch and return to the starting position. Repeat __________ times. Complete this exercise __________ times per day.  STRENGTHENING EXERCISES -  Shoulder Instability, Multidirectional These exercises will help you begin to restore your shoulder strength once your physician has discontinued your 3-8 week immobilization period. The length of your immobilization depends on the intensity of your injury and the quality of the tissues before they were repaired. While completing these exercises, remember:   Muscles can gain both the endurance and the strength needed for everyday activities through controlled exercises.  Complete these exercises as instructed by your physician, physical therapist or athletic trainer. Progress with the resistance and repetition exercises only as your caregiver advises.  You may experience muscle soreness or fatigue, but the pain or discomfort you are trying to eliminate should never worsen during these exercises. If this pain does worsen, stop and make  certain you are following the directions exactly. If the pain is still present after adjustments, discontinue the exercise until you can discuss the trouble with your clinician.  During your recovery, avoid activity or exercises which involve actions that place your right / left hand or elbow above your head or behind your back or head. These positions stress the tissues which are trying to heal. STRENGTH - Scapular Depression and Adduction  With good posture, sit on a firm chair. Supported your arms in front of you with pillows, arm rests or a table top. Have your elbows in line with the sides of your body.  Gently draw your shoulder blades down and toward your mid-back spine. Gradually increase the tension without tensing the muscles along the top of your shoulders and the back of your neck.  Hold for __________ seconds. Slowly release the tension and relax your muscles completely before completing the next repetition.  After you have practiced this exercise, remove the arm support and complete it in standing as well as sitting. Repeat __________ times. Complete this exercise __________ times per day.  STRENGTH - Shoulder Flexion, Isometric  With good posture and facing a wall, stand or sit about 4-6 inches away.  Keeping your right / left elbow straight, gently press the top of your fist into the wall. Increase the pressure gradually until you are pressing as hard as you can without shrugging your shoulder or increasing any shoulder discomfort.  Hold __________ seconds.  Release the tension slowly. Relax your shoulder muscles completely before you do the next repetition. Repeat __________ times. Complete this exercise __________ times per day.  STRENGTH - Shoulder Abductors, Isometric  With good posture, stand or sit about 4-6 inches from a wall with your right / left side facing the wall.  Bend your right / left elbow. Gently press your right / left elbow into the wall. Increase the  pressure gradually until you are pressing as hard as you can without shrugging your shoulder or increasing any shoulder discomfort.  Hold __________ seconds.  Release the tension slowly. Relax your shoulder muscles completely before you do the next repetition. Repeat __________ times. Complete this exercise __________ times per day.  STRENGTH - Internal Rotators, Isometric  Keep your right / left elbow at your side and bend it 90 degrees.  Step into a door frame so that the inside of your right / left wrist can press against the door frame without your upper arm leaving your side.  Gently press your right / left wrist into the door frame as if you were trying to draw the palm of your hand to your abdomen. Gradually increase the tension until you are pressing as hard as you can without shrugging your shoulder or  increasing any shoulder discomfort.  Hold __________ seconds.  Release the tension slowly. Relax your shoulder muscles completely before you do the next repetition. Repeat __________ times. Complete this exercise __________ times per day.  STRENGTH - External Rotators, Isometric   Keep your right / left elbow at your side and bend it 90 degrees.  Step into a door frame so that the outside of your right / left wrist can press against the door frame without your upper arm leaving your side.  Gently press your right / left wrist into the door frame as if you were trying to swing the back of your hand away from your abdomen. Gradually increase the tension until you are pressing as hard as you can without shrugging your shoulder or increasing any shoulder discomfort.  Hold __________ seconds.  Release the tension slowly. Relax your shoulder muscles completely before you do the next repetition. Repeat __________ times. Complete this exercise __________ times per day.  STRENGTH - Shoulder Extensors   Secure a rubber exercise band/tubing so that it is at the height of your shoulders  when you are either standing or sitting on a firm arm-less chair.  With a thumbs-up grip, grasp an end of the band/tubing in each hand. Straighten your elbows and lift your hands straight in front of you at shoulder height. Step back away from the secured end of band/tubing until it becomes tense.  Squeezing your shoulder blades together, pull your hands down to the sides of your thighs. Do not allow your hands to go behind you.  Hold for __________ seconds. Slowly ease the tension on the band/tubing as you reverse the directions and return to the starting position. Repeat __________ times. Complete this exercise __________ times per day.  STRENGTH - Internal Rotators  Secure a rubber exercise band/tubing to a fixed object so that it is at the same height as your right / left elbow when you are standing or sitting on a firm surface.  Stand or sit so that the secured exercise band/tubing is at your right / left side.  Bend your elbow 90 degrees. Place a folded towel or small pillow under your right / left arm so that your elbow is a few inches away from your side.  Keeping the tension on the exercise band/tubing, pull it across your body toward your abdomen. Be sure to keep your body steady so that the movement is only coming from your shoulder rotating.  Hold __________ seconds. Release the tension in a controlled manner as you return to the starting position. Repeat __________ times. Complete this exercise __________ times per day.  STRENGTH - External Rotators  Secure a rubber exercise band/tubing to a fixed object so that it is at the same height as your right / left elbow when you are standing or sitting on a firm surface.  Stand or sit so that the secured exercise band/tubing is at your side that is not injured.  Bend your elbow 90 degrees. Place a folded towel or small pillow under your right / left arm so that your elbow is a few inches away from your side.  Keeping the tension on  the exercise band/tubing, pull it away from your body, as if pivoting on your elbow. Be sure to keep your body steady so that the movement is only coming from your shoulder rotating.  Hold __________ seconds. Release the tension in a controlled manner as you return to the starting position. Repeat __________ times. Complete this exercise  __________ times per day.  STRENGTH - Scapular Protractors, Standing  Stand arms-length away from a wall. Place your hands on the wall, keeping your elbows straight.  Begin by dropping your shoulder blades down and toward your mid-back spine.  To strengthen your protractors, keep your shoulder blades down, but slide them forward on your rib cage. It will feel as if you are lifting the back of your rib cage away from the wall. This is a subtle motion and can be challenging to complete. Ask your clinician for further instruction if you are not sure you are doing the exercise correctly.  Hold for __________ seconds. Slowly return to the starting position, resting the muscles completely before completing the next repetition. Repeat __________ times. Complete this exercise __________ times per day. STRENGTH - Shoulder Flexion  Stand or sit with good posture. Grasp a __________ weight or an exercise band/tubing so that your hand is "thumbs-up," like when you shake hands.  Slowly lift your right / left arm as far as you can without increasing any shoulder pain. Initially, many people can only raise their hand to shoulder height.  Avoid shrugging your right / left shoulder as your arm rises by keeping your shoulder blade tucked down and toward your mid-back spine.  Hold for __________ seconds. Control the descent of your hand as you slowly return to your starting position. Repeat __________ times. Complete this exercise __________ times per day.  STRENGTH - Supraspinatus  Stand or sit with good posture. Grasp a __________ lb weight or an exercise band/tubing so  that your hand is "thumbs-up," like when you shake hands.  Slowly lift your right / left hand from your thigh into the air, traveling about 30 degrees from straight out at your side. Lift your hand to shoulder height or as far as you can without increasing any shoulder pain. Initially, many people do not lift their hands above shoulder height.  Avoid shrugging your right / left shoulder as your arm rises by keeping your shoulder blade tucked down and toward your mid-back spine.  Hold for __________ seconds. Control the descent of your hand as you slowly return to your starting position. Repeat __________ times. Complete this exercise __________ times per day.  STRENGTH - Shoulder Abductors  Stand or sit with good posture. Place your right / left arm at your side.  With a thumbs-up grasp, hold a __________ weight or a secured rubber exercise band/tubing in your right / left hand. Slowly lift your arm from your side as far as you can without reproducing any of your shoulder pain. Do not lift your hand above shoulder-height unless you have been instructed to do so by your physician, physical therapist or athletic trainer. If this motion causes pain or increased discomfort, discuss this with your physician, physical therapist, or athletic trainer.  Avoid shrugging your right / left shoulder as your arm rises by keeping your shoulder blade tucked down and toward your mid-back spine.  Hold __________ seconds. Release the tension in a controlled manner as you return to the starting position.  Repeat __________ times. Complete this exercise __________ times per day. STRENGTH - Horizontal Adductors  Secure a rubber exercise band/tubing so that it is at the height of your shoulders when you are either standing or sitting on a firm arm-less chair.  Turn away from the secured band/tube so it is directly behind you. Grasp an end of the band/tubing in each hand and have your palms face each other. Step  forward until the end of band/tubing until it becomes tense.  Keeping your arms at your sides, lift your elbows so they are 90 degrees from your body. Your arms should be slightly bent.  Keeping your arms elevated 90 degrees, draw your palms together.  Hold __________ seconds. Slowly ease the tension on the band/tubing as you reverse the directions and return to the starting position. Repeat __________ times. Complete this exercise __________ times per day. STRENGTH - Scapular Retractors and External Rotators  Secure a rubber exercise band/tubing so that it is at the height of your shoulders when you are either standing or sitting on a firm arm-less chair.  With a palm-down grip, grasp an end of the band/tubing in each hand. Bend your elbows 90 degrees and lift your elbows to shoulder height at your sides. Step back away from the secured end of band/tubing until it becomes tense.  Squeezing your shoulder blades together, rotate your shoulder so that your upper arm and elbow remain stationary, but your fists travel upward to head-height.  Hold for __________ seconds. Slowly ease the tension on the band/tubing as you reverse the directions and return to the starting position. Repeat __________ times. Complete this exercise __________ times per day.  Document Released: 02/10/2005 Document Revised: 05/05/2011 Document Reviewed: 05/25/2008 [facility] Patient Information 2015 [place], [place]. This information is not intended to replace advice given to you by your health care provider. Make sure you discuss any questions you have with your health care provider.

## 2014-10-27 NOTE — ED Notes (Signed)
Pt c/o of possible shoulder dislocation to RT shoulder. Pt states this has been an ongoing problem, but is unable to have surgery. Pt states she was reaching up when she "felt it pop out." Pt's RT shoulder appears to be lower than LT side. Extremity is warm to touch. Radial pulse strong. Cap refill brisk.

## 2014-10-27 NOTE — ED Provider Notes (Addendum)
CSN: [id number]     Arrival date & time 10/27/14  1437 History   First MD Initiated Contact with Patient 10/27/14 1503     Chief Complaint  Patient presents with  . Shoulder Injury     (Consider location/radiation/quality/duration/timing/severity/associated sxs/prior Treatment) HPI   Patient is a pleasant 29 year old female presenting with recurrent shoulder  dislocation. It happened when she reached for something earlier today. Patient's had a recurrent problem. Patient states that it sometimes dislocates "up" and sometimes "down". Patient is that she usually needs to be put sleep in order to get the shoulder back in place if she is unable to do it herself. She has no history of asthma or other lung disease. Patient has full sensation in her upper arm and lower arm.  Past Medical History  Diagnosis Date  . Shoulder dislocation, recurrent   . Contraceptive management 12/06/2013  . Vaginal discharge 01/18/2014  . BV (bacterial vaginosis) 01/18/2014   History reviewed. No pertinent past surgical history. Family History  Problem Relation Age of Onset  . Diabetes Sister   . Cancer Paternal Grandmother     breast  . Asthma Son    Social History  Substance Use Topics  . Smoking status: Former Smoker    Types: Cigarettes  . Smokeless tobacco: Never Used  . Alcohol Use: Yes     Comment: occassional   OB History    Gravida Para Term Preterm AB TAB SAB Ectopic Multiple Living   Review of Systems  Constitutional: Negative for activity change.  Respiratory: Negative for shortness of breath.   Cardiovascular: Negative for chest pain.  Gastrointestinal: Negative for abdominal pain.      Allergies  Sulfa antibiotics  Home Medications   Prior to Admission medications   Medication Sig Start Date End Date Taking? Authorizing Provider  ibuprofen (ADVIL,MOTRIN) 800 MG tablet Take 1 tablet (800 mg total) by mouth 3 (three) times daily. 10/27/14   Alveena Taira Lyn  Trystian Crisanto, MD  medroxyPROGESTERone (DEPO-PROVERA) 150 MG/ML injection Inject 1 mL (150 mg total) into the muscle every 3 (three) months. 12/06/13   Adline Potter, NP   BP 135/83 mmHg  Pulse 97  Temp(Src) 98 F (36.7 C) (Oral)  Resp 22  Ht  (1.626 m)  Wt 175 lb (79.379 kg)  BMI 30.02 kg/m2  SpO2 100% Physical Exam  Constitutional: She is oriented to person, place, and time. She appears well-developed and well-nourished.  HENT:  Head: Normocephalic and atraumatic.  Eyes: Conjunctivae are normal. Right eye exhibits no discharge.  Neck: Neck supple.  Cardiovascular: Normal rate, regular rhythm and normal heart sounds.   No murmur heard. Pulmonary/Chest: Effort normal and breath sounds normal. She has no wheezes. She has no rales.  Musculoskeletal: Normal range of motion. She exhibits no edema.  Axillary nerve sensation intact. Pulses intact. Right shoulder appears abnormal.  Neurological: She is oriented to person, place, and time.  Skin: Skin is warm and dry. No rash noted. She is not diaphoretic.  Psychiatric: She has a normal mood and affect. Her behavior is normal.  Nursing note and vitals reviewed.   ED Course  Procedures (including critical care time) Labs Review Labs Reviewed - No data to [id number] Review No results found. I have personally reviewed and evaluated these images and lab results as part of my medical decision-making.   EKG Interpretation None      MDM  Final diagnoses:  Shoulder dislocation   patient is a 29 year old female presenting with shoulder dislocation. This is a recurrent problem for her. We'll get an x-ray. Then plan to to do conscious sedation in order to reduce shoulder. We'll send her home and sling and have her follow-up with orthopedist as planned.  Xray shows shoulder in place, likely back in place when she got dressed in the gown.   SHe feels improved and already has follow up.   Mychal Decarlo Randall An, MD 10/27/14  1553  Khylah Kendra Randall An, MD 10/27/14 1553

## 2014-10-27 NOTE — ED Notes (Signed)
Patient given discharge instruction, verbalized understand. Patient ambulatory out of the department.  

## 2014-11-13 ENCOUNTER — Other Ambulatory Visit: Payer: Self-pay | Admitting: Adult Health

## 2014-11-14 ENCOUNTER — Ambulatory Visit (INDEPENDENT_AMBULATORY_CARE_PROVIDER_SITE_OTHER): Payer: Medicaid Other | Admitting: *Deleted

## 2014-11-14 ENCOUNTER — Ambulatory Visit: Payer: Medicaid Other

## 2014-11-14 ENCOUNTER — Encounter: Payer: Self-pay | Admitting: *Deleted

## 2014-11-14 DIAGNOSIS — Z3042 Encounter for surveillance of injectable contraceptive: Secondary | ICD-10-CM

## 2014-11-14 DIAGNOSIS — Z3202 Encounter for pregnancy test, result negative: Secondary | ICD-10-CM

## 2014-11-14 LAB — POCT URINE PREGNANCY: PREG TEST UR: NEGATIVE

## 2014-11-14 MED ORDER — MEDROXYPROGESTERONE ACETATE 150 MG/ML IM SUSP
150.0000 mg | Freq: Once | INTRAMUSCULAR | Status: AC
  Administered 2014-11-14: 150 mg via INTRAMUSCULAR

## 2014-11-14 NOTE — Progress Notes (Signed)
Pt here for Depo. Reports no problems at this time. Return in 12 weeks for next shot. JSY 

## 2014-12-21 ENCOUNTER — Encounter (HOSPITAL_COMMUNITY): Payer: Self-pay | Admitting: Emergency Medicine

## 2014-12-21 ENCOUNTER — Emergency Department (HOSPITAL_COMMUNITY)
Admission: EM | Admit: 2014-12-21 | Discharge: 2014-12-21 | Disposition: A | Discharge status: Home / Self Care | Payer: Medicaid Other | Attending: Emergency Medicine | Admitting: Emergency Medicine

## 2014-12-21 ENCOUNTER — Emergency Department (HOSPITAL_COMMUNITY): Payer: Medicaid Other

## 2014-12-21 DIAGNOSIS — Z8742 Personal history of other diseases of the female genital tract: Secondary | ICD-10-CM | POA: Insufficient documentation

## 2014-12-21 DIAGNOSIS — Y998 Other external cause status: Secondary | ICD-10-CM | POA: Diagnosis not present

## 2014-12-21 DIAGNOSIS — S3992XA Unspecified injury of lower back, initial encounter: Secondary | ICD-10-CM | POA: Diagnosis not present

## 2014-12-21 DIAGNOSIS — S4992XA Unspecified injury of left shoulder and upper arm, initial encounter: Secondary | ICD-10-CM | POA: Insufficient documentation

## 2014-12-21 DIAGNOSIS — Y9389 Activity, other specified: Secondary | ICD-10-CM | POA: Insufficient documentation

## 2014-12-21 DIAGNOSIS — Z8619 Personal history of other infectious and parasitic diseases: Secondary | ICD-10-CM | POA: Diagnosis not present

## 2014-12-21 DIAGNOSIS — Z87828 Personal history of other (healed) physical injury and trauma: Secondary | ICD-10-CM | POA: Diagnosis not present

## 2014-12-21 DIAGNOSIS — S161XXA Strain of muscle, fascia and tendon at neck level, initial encounter: Secondary | ICD-10-CM | POA: Diagnosis not present

## 2014-12-21 DIAGNOSIS — Y92410 Unspecified street and highway as the place of occurrence of the external cause: Secondary | ICD-10-CM | POA: Insufficient documentation

## 2014-12-21 DIAGNOSIS — S199XXA Unspecified injury of neck, initial encounter: Secondary | ICD-10-CM | POA: Diagnosis present

## 2014-12-21 DIAGNOSIS — Z87891 Personal history of nicotine dependence: Secondary | ICD-10-CM | POA: Insufficient documentation

## 2014-12-21 MED ORDER — DICLOFENAC SODIUM 75 MG PO TBEC: 75.0000 mg | DELAYED_RELEASE_TABLET | Freq: Two times a day (BID) | ORAL | Status: DC

## 2014-12-21 MED ORDER — IBUPROFEN 800 MG PO TABS: 800.0000 mg | ORAL_TABLET | Freq: Three times a day (TID) | ORAL | Status: DC

## 2014-12-21 MED ORDER — ACETAMINOPHEN 325 MG PO TABS
650.0000 mg | ORAL_TABLET | Freq: Once | ORAL | Status: AC
  Administered 2014-12-21: 650 mg via ORAL
  Filled 2014-12-21: qty 2

## 2014-12-21 NOTE — ED Notes (Signed)
Per Verline LemaK Sofia- PA remove c-colar at this time. Oders carried out.
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
## 2014-12-21 NOTE — ED Notes (Signed)
Patient ambulatory to restroom without difficulty. UA sample obtained and in pt room on counter.
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
## 2014-12-21 NOTE — ED Notes (Signed)
Patient reuqesting something for pain. K. Sofia made aware. New verbal orders given.

## 2014-12-25 ENCOUNTER — Encounter (HOSPITAL_COMMUNITY): Payer: Self-pay | Admitting: Emergency Medicine

## 2014-12-25 ENCOUNTER — Emergency Department (HOSPITAL_COMMUNITY)
Admission: EM | Admit: 2014-12-25 | Discharge: 2014-12-25 | Disposition: A | Discharge status: Home / Self Care | Payer: Medicaid Other | Attending: Emergency Medicine | Admitting: Emergency Medicine

## 2014-12-25 ENCOUNTER — Emergency Department (HOSPITAL_COMMUNITY): Payer: Medicaid Other

## 2014-12-25 DIAGNOSIS — Z3202 Encounter for pregnancy test, result negative: Secondary | ICD-10-CM | POA: Insufficient documentation

## 2014-12-25 DIAGNOSIS — S199XXA Unspecified injury of neck, initial encounter: Secondary | ICD-10-CM | POA: Insufficient documentation

## 2014-12-25 DIAGNOSIS — Z791 Long term (current) use of non-steroidal anti-inflammatories (NSAID): Secondary | ICD-10-CM | POA: Insufficient documentation

## 2014-12-25 DIAGNOSIS — Z8742 Personal history of other diseases of the female genital tract: Secondary | ICD-10-CM | POA: Insufficient documentation

## 2014-12-25 DIAGNOSIS — S3992XA Unspecified injury of lower back, initial encounter: Secondary | ICD-10-CM | POA: Diagnosis present

## 2014-12-25 DIAGNOSIS — Y9389 Activity, other specified: Secondary | ICD-10-CM | POA: Insufficient documentation

## 2014-12-25 DIAGNOSIS — Y92410 Unspecified street and highway as the place of occurrence of the external cause: Secondary | ICD-10-CM | POA: Insufficient documentation

## 2014-12-25 DIAGNOSIS — Z79818 Long term (current) use of other agents affecting estrogen receptors and estrogen levels: Secondary | ICD-10-CM | POA: Insufficient documentation

## 2014-12-25 DIAGNOSIS — Z87891 Personal history of nicotine dependence: Secondary | ICD-10-CM | POA: Diagnosis not present

## 2014-12-25 DIAGNOSIS — M7918 Myalgia, other site: Secondary | ICD-10-CM

## 2014-12-25 DIAGNOSIS — Y998 Other external cause status: Secondary | ICD-10-CM | POA: Diagnosis not present

## 2014-12-25 DIAGNOSIS — S3991XA Unspecified injury of abdomen, initial encounter: Secondary | ICD-10-CM | POA: Diagnosis not present

## 2014-12-25 LAB — POC URINE PREG, ED: PREG TEST UR: NEGATIVE

## 2014-12-25 LAB — I-STAT CHEM 8, ED
BUN: 12 mg/dL (ref 6–20)
CALCIUM ION: 1.18 mmol/L (ref 1.12–1.23)
CREATININE: 0.8 mg/dL (ref 0.44–1.00)
Chloride: 106 mmol/L (ref 101–111)
GLUCOSE: 112 mg/dL — AB (ref 65–99)
HCT: 43 % (ref 36.0–46.0)
HEMOGLOBIN: 14.6 g/dL (ref 12.0–15.0)
Potassium: 3.6 mmol/L (ref 3.5–5.1)
Sodium: 142 mmol/L (ref 135–145)
TCO2: 23 mmol/L (ref 0–100)

## 2014-12-25 MED ORDER — KETOROLAC TROMETHAMINE 60 MG/2ML IM SOLN
60.0000 mg | Freq: Once | INTRAMUSCULAR | Status: AC
  Administered 2014-12-25: 60 mg via INTRAMUSCULAR
  Filled 2014-12-25: qty 2

## 2014-12-25 MED ORDER — MORPHINE SULFATE (PF) 4 MG/ML IV SOLN
4.0000 mg | Freq: Once | INTRAVENOUS | Status: AC
  Administered 2014-12-25: 4 mg via INTRAMUSCULAR
  Filled 2014-12-25: qty 1

## 2014-12-25 NOTE — ED Notes (Signed)
Pt states lower back and pelvic pain not controled w/ medications given here when check out. Pt says her legs are weak sometime but has had no trouble walking. Pt ambulated from wheelchair to stretcher.
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
## 2014-12-25 NOTE — ED Notes (Signed)
Pt alert & oriented x4, stable gait. Patient given discharge instructions, paperwork & prescription(s). Patient  instructed to stop at the registration desk to finish any additional paperwork. Patient verbalized understanding. Pt left department w/ no further questions. 
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
## 2015-02-06 ENCOUNTER — Encounter: Payer: Self-pay | Admitting: *Deleted

## 2015-02-06 ENCOUNTER — Ambulatory Visit: Payer: Medicaid Other

## 2015-02-06 ENCOUNTER — Ambulatory Visit (INDEPENDENT_AMBULATORY_CARE_PROVIDER_SITE_OTHER): Payer: Medicaid Other | Admitting: *Deleted

## 2015-02-06 DIAGNOSIS — Z3042 Encounter for surveillance of injectable contraceptive: Secondary | ICD-10-CM | POA: Diagnosis not present

## 2015-02-06 DIAGNOSIS — Z3202 Encounter for pregnancy test, result negative: Secondary | ICD-10-CM | POA: Diagnosis not present

## 2015-02-06 LAB — POCT URINE PREGNANCY: Preg Test, Ur: NEGATIVE

## 2015-02-06 MED ORDER — MEDROXYPROGESTERONE ACETATE 150 MG/ML IM SUSP
150.0000 mg | Freq: Once | INTRAMUSCULAR | Status: AC
  Administered 2015-02-06: 150 mg via INTRAMUSCULAR

## 2015-02-06 NOTE — Progress Notes (Signed)
Pt here for Depo. Reports no problems at this time. Return in 12 weeks for next shot. JSY 

## 2015-03-15 ENCOUNTER — Other Ambulatory Visit (HOSPITAL_COMMUNITY): Payer: Self-pay | Admitting: Orthopedic Surgery

## 2015-03-28 ENCOUNTER — Other Ambulatory Visit (HOSPITAL_COMMUNITY): Payer: Self-pay | Admitting: Orthopedic Surgery

## 2015-03-29 ENCOUNTER — Other Ambulatory Visit (HOSPITAL_COMMUNITY): Payer: Self-pay | Admitting: *Deleted

## 2015-03-30 ENCOUNTER — Inpatient Hospital Stay (HOSPITAL_COMMUNITY): Admission: RE | Admit: 2015-03-30 | Payer: Medicaid Other | Source: Ambulatory Visit

## 2015-04-03 ENCOUNTER — Encounter (HOSPITAL_COMMUNITY): Admission: RE | Payer: Self-pay | Source: Ambulatory Visit

## 2015-04-03 ENCOUNTER — Ambulatory Visit (HOSPITAL_COMMUNITY)
Admission: RE | Admit: 2015-04-03 | Payer: No Typology Code available for payment source | Source: Ambulatory Visit | Admitting: Orthopedic Surgery

## 2015-04-03 SURGERY — ARTHROSCOPY, SHOULDER, WITH GLENOID LABRUM REPAIR
Anesthesia: General | Site: Shoulder | Laterality: Right

## 2015-04-16 ENCOUNTER — Other Ambulatory Visit (HOSPITAL_COMMUNITY): Payer: Medicaid Other

## 2015-05-01 ENCOUNTER — Ambulatory Visit: Payer: Medicaid Other

## 2015-05-07 ENCOUNTER — Encounter: Payer: Self-pay | Admitting: Adult Health

## 2015-05-07 ENCOUNTER — Ambulatory Visit (INDEPENDENT_AMBULATORY_CARE_PROVIDER_SITE_OTHER): Payer: Medicaid Other | Admitting: Adult Health

## 2015-05-07 ENCOUNTER — Other Ambulatory Visit (HOSPITAL_COMMUNITY)
Admission: RE | Admit: 2015-05-07 | Discharge: 2015-05-07 | Disposition: A | Discharge status: Home / Self Care | Payer: No Typology Code available for payment source | Source: Ambulatory Visit | Attending: Adult Health | Admitting: Adult Health

## 2015-05-07 VITALS — BP 130/68 | HR 98 | Ht 63.5 in | Wt 176.0 lb

## 2015-05-07 DIAGNOSIS — Z309 Encounter for contraceptive management, unspecified: Secondary | ICD-10-CM

## 2015-05-07 DIAGNOSIS — Z01419 Encounter for gynecological examination (general) (routine) without abnormal findings: Secondary | ICD-10-CM | POA: Insufficient documentation

## 2015-05-07 DIAGNOSIS — Z3042 Encounter for surveillance of injectable contraceptive: Secondary | ICD-10-CM

## 2015-05-07 DIAGNOSIS — Z3202 Encounter for pregnancy test, result negative: Secondary | ICD-10-CM

## 2015-05-07 DIAGNOSIS — Z1151 Encounter for screening for human papillomavirus (HPV): Secondary | ICD-10-CM | POA: Insufficient documentation

## 2015-05-07 DIAGNOSIS — Z113 Encounter for screening for infections with a predominantly sexual mode of transmission: Secondary | ICD-10-CM | POA: Insufficient documentation

## 2015-05-07 LAB — POCT URINE PREGNANCY: Preg Test, Ur: NEGATIVE

## 2015-05-07 MED ORDER — MEDROXYPROGESTERONE ACETATE 150 MG/ML IM SUSP
150.0000 mg | Freq: Once | INTRAMUSCULAR | Status: AC
  Administered 2015-05-07: 150 mg via INTRAMUSCULAR

## 2015-05-07 MED ORDER — MEDROXYPROGESTERONE ACETATE 150 MG/ML IM SUSP: INTRAMUSCULAR | Status: DC

## 2015-05-07 NOTE — Progress Notes (Signed)
Patient ID: [name], female   DOB: 06/08/1985, 30 y.o.   MRN: [id number] History of Present Illness:  Tracey ReichertShemeka is a 30 year old black female in for well woman gyn exam and pap, she has family planning medicaid and gets Depo today, also.She had bleeding about 2 weeks ago on the depo and took megace to stop it.  Current Medications, Allergies, Past Medical History, Past Surgical History, Family History and Social History were reviewed in [facility] electronic medical record.     Review of Systems: Patient denies any headaches, hearing loss, fatigue, blurred vision, shortness of breath, chest pain, abdominal pain, problems with bowel movements, urination, or intercourse. No joint pain or mood swings.See HPI for positives.    Physical Exam:BP 130/68 mmHg  Pulse 98  Ht 5' 3.5" (1.613 m)  Wt 176 lb (79.833 kg)  BMI 30.68 kg/m2  LMP 04/23/2015 UPT negative General:  Well developed, well nourished, no acute distress Skin:  Warm and dry Neck:  Midline trachea, normal thyroid, good ROM, no lymphadenopathy Lungs; Clear to auscultation bilaterally Breast:  No dominant palpable mass, retraction, or nipple discharge Cardiovascular: Regular rate and rhythm Abdomen:  Soft, non tender, no hepatosplenomegaly Pelvic:  External genitalia is normal in appearance, no lesions.  The vagina is normal in appearance. Urethra has no lesions or masses. The cervix is bulbous.Pap with HPV and GC/CHL performed.  Uterus is felt to be normal size, shape, and contour.  No adnexal masses or tenderness noted.Bladder is non tender, no masses felt. Extremities/musculoskeletal:  No swelling or varicosities noted, no clubbing or cyanosis Psych:  No mood changes, alert and cooperative,seems happy To get depo today.  Impression: Well woman gyn exam and pap, family planning medicaid  Contraceptive management   Plan: Check HIV,RPR and HSV 2  Rx Depo provera 150 mg, disp.# 1 vail for IM injection every 3 months in  office, with 4 refills Physical in 1 year, pap in 3 if normal Depo every 12 weeks, keep period/bleeding calendar and let me know if has again

## 2015-05-07 NOTE — Addendum Note (Signed)
Addended by: Colen DarlingYOUNG, Langford Carias S on: 05/07/2015 12:29 PM   Modules accepted: Orders

## 2015-05-07 NOTE — Patient Instructions (Signed)
Keep period calendar Get depo every 12 weeks Physical in 1 year

## 2015-05-08 LAB — CYTOLOGY - PAP

## 2015-05-08 LAB — HIV ANTIBODY (ROUTINE TESTING W REFLEX): HIV Screen 4th Generation wRfx: NONREACTIVE

## 2015-05-08 LAB — RPR: RPR: NONREACTIVE

## 2015-05-08 LAB — HSV 2 ANTIBODY, IGG: HSV 2 Glycoprotein G Ab, IgG: <0.91 index (ref 0.00–0.90)

## 2015-05-26 ENCOUNTER — Emergency Department (HOSPITAL_COMMUNITY): Payer: Self-pay

## 2015-05-26 ENCOUNTER — Encounter (HOSPITAL_COMMUNITY): Payer: Self-pay | Admitting: *Deleted

## 2015-05-26 ENCOUNTER — Emergency Department (HOSPITAL_COMMUNITY)
Admission: EM | Admit: 2015-05-26 | Discharge: 2015-05-26 | Disposition: A | Discharge status: Home / Self Care | Payer: Self-pay | Attending: Emergency Medicine | Admitting: Emergency Medicine

## 2015-05-26 DIAGNOSIS — Z87891 Personal history of nicotine dependence: Secondary | ICD-10-CM | POA: Insufficient documentation

## 2015-05-26 DIAGNOSIS — R1013 Epigastric pain: Secondary | ICD-10-CM | POA: Insufficient documentation

## 2015-05-26 LAB — TROPONIN I

## 2015-05-26 LAB — BASIC METABOLIC PANEL
Anion gap: 9 (ref 5–15)
BUN: 10 mg/dL (ref 6–20)
CALCIUM: 9.2 mg/dL (ref 8.9–10.3)
CHLORIDE: 108 mmol/L (ref 101–111)
CO2: 22 mmol/L (ref 22–32)
CREATININE: 0.83 mg/dL (ref 0.44–1.00)
GFR calc non Af Amer: >60 mL/min (ref >60)
GLUCOSE: 109 mg/dL — AB (ref 65–99)
Potassium: 3.3 mmol/L — ABNORMAL LOW (ref 3.5–5.1)
Sodium: 139 mmol/L (ref 135–145)

## 2015-05-26 LAB — CBC
HCT: 41.4 % (ref 36.0–46.0)
Hemoglobin: 13.8 g/dL (ref 12.0–15.0)
MCH: 28.2 pg (ref 26.0–34.0)
MCHC: 33.3 g/dL (ref 30.0–36.0)
MCV: 84.7 fL (ref 78.0–100.0)
PLATELETS: 214 10*3/uL (ref 150–400)
RBC: 4.89 MIL/uL (ref 3.87–5.11)
RDW: 13.5 % (ref 11.5–15.5)
WBC: 7.1 10*3/uL (ref 4.0–10.5)

## 2015-05-26 MED ORDER — FAMOTIDINE 20 MG PO TABS
20.0000 mg | ORAL_TABLET | Freq: Once | ORAL | Status: AC
  Administered 2015-05-26: 20 mg via ORAL
  Filled 2015-05-26: qty 1

## 2015-05-26 MED ORDER — FAMOTIDINE 20 MG PO TABS
20.0000 mg | ORAL_TABLET | Freq: Two times a day (BID) | ORAL | Status: DC
Start: 2015-05-26 — End: 2015-10-25

## 2015-05-26 NOTE — Discharge Instructions (Signed)
#  1 - follow diet guide attached #2 - Pepcid twice daily #3 - no more motrin / ibuprofen / aleve or aspirin - tylenol only for pain  Please obtain all of your results from medical records or have your doctors office obtain the results - share them with your doctor - you should be seen at your doctors office in the next 2 days. Call today to arrange your follow up. Take the medications as prescribed. Please review all of the medicines and only take them if you do not have an allergy to them. Please be aware that if you are taking birth control pills, taking other prescriptions, ESPECIALLY ANTIBIOTICS may make the birth control ineffective - if this is the case, either do not engage in sexual activity or use alternative methods of birth control such as condoms until you have finished the medicine and your family doctor says it is OK to restart them. If you are on a blood thinner such as COUMADIN, be aware that any other medicine that you take may cause the coumadin to either work too much, or not enough - you should have your coumadin level rechecked in next 7 days if this is the case.  ?  It is also a possibility that you have an allergic reaction to any of the medicines that you have been prescribed - Everybody reacts differently to medications and while MOST people have no trouble with most medicines, you may have a reaction such as nausea, vomiting, rash, swelling, shortness of breath. If this is the case, please stop taking the medicine immediately and contact your physician.  ?  You should return to the ER if you develop severe or worsening symptoms.

## 2015-05-26 NOTE — ED Notes (Signed)
Pt state she has been having bilateral hand tingling, bilateral feet tingling, and chest pains for around 2 weeks. Pt anxious upon triage and tearful. EKG taken and given to MD.

## 2015-05-26 NOTE — ED Provider Notes (Signed)
CSN: [id number]     Arrival date & time 05/26/15  1209 History   First MD Initiated Contact with Patient 05/26/15 1513     Chief Complaint  Patient presents with  . Chest Pain     (Consider location/radiation/quality/duration/timing/severity/associated sxs/prior Treatment) HPI  The pt has hx of epigastric pain for 2 weeks - worse after eating and at night - not exertional - has some radiation to the mid to upper back - it is not exertional - she had n/v after taking coffee today to try to alleviate the pain - she has been using frequent large volume of nsaids for pain.  She has no hx of heart disease / PUD.  No FHx of heart disease, other than depo shots for years, no RF for PE - she has no other RF for ACS.  Past Medical History  Diagnosis Date  . Shoulder dislocation, recurrent   . Contraceptive management 12/06/2013  . Vaginal discharge 01/18/2014  . BV (bacterial vaginosis) 01/18/2014   History reviewed. No pertinent past surgical history. Family History  Problem Relation Age of Onset  . Diabetes Sister   . Cancer Paternal Grandmother     breast  . Asthma Son    Social History  Substance Use Topics  . Smoking status: Former Smoker    Types: Cigarettes  . Smokeless tobacco: Never Used  . Alcohol Use: Yes     Comment: occassional   OB History    Gravida Para Term Preterm AB TAB SAB Ectopic Multiple Living   Review of Systems  All other systems reviewed and are negative.     Allergies  Sulfa antibiotics  Home Medications   Prior to Admission medications   Medication Sig Start Date End Date Taking? Authorizing Provider  acetaminophen (TYLENOL) 500 MG tablet Take 500 mg by mouth every 6 (six) hours as needed for mild pain.    Historical Provider, MD  famotidine (PEPCID) 20 MG tablet Take 1 tablet (20 mg total) by mouth 2 (two) times daily. 05/26/15   Eber Hong, MD  medroxyPROGESTERone (DEPO-PROVERA) 150 MG/ML injection INJECT INTO THE  MUSCLE EVERY 3 MONTHS. 05/07/15   Adline Potter, NP   BP 126/81 mmHg  Pulse 92  Temp(Src) 99.2 F (37.3 C) (Temporal)  Resp 16  Ht  (1.626 m)  Wt 175 lb (79.379 kg)  BMI 30.02 kg/m2  SpO2 100%  LMP 05/19/2015 Physical Exam  Constitutional: She appears well-developed and well-nourished. No distress.  HENT:  Head: Normocephalic and atraumatic.  Mouth/Throat: Oropharynx is clear and moist. No oropharyngeal exudate.  Eyes: Conjunctivae and EOM are normal. Pupils are equal, round, and reactive to light. Right eye exhibits no discharge. Left eye exhibits no discharge. No scleral icterus.  Neck: Normal range of motion. Neck supple. No JVD present. No thyromegaly present.  Cardiovascular: Normal rate, regular rhythm, normal heart sounds and intact distal pulses.  Exam reveals no gallop and no friction rub.   No murmur heard. Pulmonary/Chest: Effort normal and breath sounds normal. No respiratory distress. She has no wheezes. She has no rales.  Abdominal: Soft. Bowel sounds are normal. She exhibits no distension and no mass. There is tenderness ( epigastric ttp without guarding, no RUQ ttp or murphy's and no other ttp).  Musculoskeletal: Normal range of motion. She exhibits no edema or tenderness.  Lymphadenopathy:    She has no cervical adenopathy.  Neurological: She  is alert. Coordination normal.  Skin: Skin is warm and dry. No rash noted. No erythema.  Psychiatric: She has a normal mood and affect. Her behavior is normal.  Nursing note and vitals reviewed.   ED Course  Procedures (including critical care time) Labs Review Labs Reviewed  BASIC METABOLIC PANEL - Abnormal; Notable for the following:    Potassium 3.3 (*)    Glucose, Bld 109 (*)    All other components within normal limits  CBC  TROPONIN I    Imaging Review Dg Chest 2 View  05/26/2015  CLINICAL DATA:  Acute chest pain. EXAM: CHEST  2 VIEW COMPARISON:  None. FINDINGS: The cardiomediastinal silhouette is  unremarkable. There is no evidence of focal airspace disease, pulmonary edema, suspicious pulmonary nodule/mass, pleural effusion, or pneumothorax. No acute bony abnormalities are identified. IMPRESSION: No active cardiopulmonary disease. Electronically Signed   By: Harmon PierJeffrey  Hu M.D.   On: 05/26/2015 12:53   I have personally reviewed and evaluated these images and lab results as part of my medical decision-making.   EKG Interpretation   Date/Time:  Saturday May 26 2015 12:15:22 EDT Ventricular Rate:  103 PR Interval:  146 QRS Duration: 78 QT Interval:  354 QTC Calculation: 463 R Axis:   -10 Text Interpretation:  Sinus tachycardia Left anterior fasicular block ECG  OTHERWISE WITHIN NORMAL LIMITS No old tracing to compare Confirmed by  Mava Suares  MD, Ayden Apodaca ([id number]) on 05/26/2015 3:14:48 PM      MDM   Final diagnoses:  Epigastric pain    The pt has unremarkable ECG, labs and CXR -   I have personally viewed and interpreted the imaging and agree with radiologist interpretation.  [pt informed of plan - given pepcid and BID for home, f/u with PCP or here if worsened for US - doubt Acute Chole given exam.  More likelyi PUD given NSAID use.  Given dietry instructions - pt voiced understanding and agreement to the plan.  Meds given in ED:  Medications  famotidine (PEPCID) tablet 20 mg (not administered)    New Prescriptions   FAMOTIDINE (PEPCID) 20 MG TABLET    Take 1 tablet (20 mg total) by mouth 2 (two) times daily.        Eber HongBrian Mort Smelser, MD 05/26/15 [phone]

## 2015-05-28 ENCOUNTER — Telehealth: Payer: Self-pay | Admitting: Adult Health

## 2015-05-28 MED ORDER — MEGESTROL ACETATE 40 MG PO TABS: 40.0000 mg | ORAL_TABLET | Freq: Every day | ORAL | Status: DC

## 2015-05-28 NOTE — Telephone Encounter (Signed)
Pt called stating that she would like to speak with Victorino DikeJennifer regarding a medication to stop the bleeding. Please contact pt

## 2015-05-28 NOTE — Telephone Encounter (Signed)
Spoke with pt. Pt is on Depo. She is having some heavy vaginal bleeding. Passing clots. She has been on Megace in the past and would like some more. Please advise. Thanks!! JSY

## 2015-05-28 NOTE — Telephone Encounter (Signed)
Pt aware Megace was sent to pharmacy. JSY 

## 2015-05-28 NOTE — Telephone Encounter (Signed)
Will refill megace 

## 2015-06-15 ENCOUNTER — Emergency Department (HOSPITAL_COMMUNITY): Payer: Self-pay

## 2015-06-15 ENCOUNTER — Encounter (HOSPITAL_COMMUNITY): Payer: Self-pay | Admitting: Cardiology

## 2015-06-15 ENCOUNTER — Emergency Department (HOSPITAL_COMMUNITY)
Admission: EM | Admit: 2015-06-15 | Discharge: 2015-06-15 | Disposition: A | Discharge status: Home / Self Care | Payer: Self-pay | Attending: Emergency Medicine | Admitting: Emergency Medicine

## 2015-06-15 DIAGNOSIS — Y999 Unspecified external cause status: Secondary | ICD-10-CM | POA: Insufficient documentation

## 2015-06-15 DIAGNOSIS — Z79899 Other long term (current) drug therapy: Secondary | ICD-10-CM | POA: Insufficient documentation

## 2015-06-15 DIAGNOSIS — Y929 Unspecified place or not applicable: Secondary | ICD-10-CM | POA: Insufficient documentation

## 2015-06-15 DIAGNOSIS — Y9389 Activity, other specified: Secondary | ICD-10-CM | POA: Insufficient documentation

## 2015-06-15 DIAGNOSIS — X58XXXA Exposure to other specified factors, initial encounter: Secondary | ICD-10-CM | POA: Insufficient documentation

## 2015-06-15 DIAGNOSIS — S43004A Unspecified dislocation of right shoulder joint, initial encounter: Secondary | ICD-10-CM | POA: Insufficient documentation

## 2015-06-15 DIAGNOSIS — Z87891 Personal history of nicotine dependence: Secondary | ICD-10-CM | POA: Insufficient documentation

## 2015-06-15 DIAGNOSIS — IMO0001 Reserved for inherently not codable concepts without codable children: Secondary | ICD-10-CM

## 2015-06-15 MED ORDER — ONDANSETRON HCL 4 MG/2ML IJ SOLN
4.0000 mg | Freq: Once | INTRAMUSCULAR | Status: AC
  Administered 2015-06-15: 4 mg via INTRAMUSCULAR

## 2015-06-15 MED ORDER — ONDANSETRON HCL 4 MG/2ML IJ SOLN
INTRAMUSCULAR | Status: AC
  Filled 2015-06-15: qty 2

## 2015-06-15 MED ORDER — HYDROMORPHONE HCL 1 MG/ML IJ SOLN
1.0000 mg | Freq: Once | INTRAMUSCULAR | Status: AC
  Administered 2015-06-15: 1 mg via INTRAVENOUS

## 2015-06-15 MED ORDER — PROPOFOL 10 MG/ML IV BOLUS
40.0000 mg | Freq: Once | INTRAVENOUS | Status: DC
  Filled 2015-06-15: qty 20

## 2015-06-15 MED ORDER — SODIUM CHLORIDE 0.9 % IV SOLN
Freq: Once | INTRAVENOUS | Status: AC
  Administered 2015-06-15: 09:00:00 via INTRAVENOUS

## 2015-06-15 MED ORDER — HYDROMORPHONE HCL 1 MG/ML IJ SOLN
INTRAMUSCULAR | Status: AC
  Filled 2015-06-15: qty 1

## 2015-06-15 MED ORDER — HYDROCODONE-ACETAMINOPHEN 5-325 MG PO TABS: 2.0000 | ORAL_TABLET | ORAL | Status: DC | PRN

## 2015-06-15 NOTE — ED Notes (Signed)
Pt states she feels better now.

## 2015-06-15 NOTE — ED Provider Notes (Signed)
CSN: [id number]     Arrival date & time 06/15/15  4098 History   First MD Initiated Contact with Patient 06/15/15 [phone]     Chief Complaint  Patient presents with  . Shoulder Injury     (Consider location/radiation/quality/duration/timing/severity/associated sxs/prior Treatment) Patient is a 30 y.o. female presenting with shoulder injury. The history is provided by the patient. No language interpreter was used.  Shoulder Injury This is a new problem. The current episode started today. The problem occurs constantly. The problem has been gradually worsening. Pertinent negatives include no joint swelling. Nothing aggravates the symptoms. She has tried nothing for the symptoms. The treatment provided no relief.  Pt reports she dislocation her shoulder while cleaning her car windows.  Past Medical History  Diagnosis Date  . Shoulder dislocation, recurrent   . Contraceptive management 12/06/2013  . Vaginal discharge 01/18/2014  . BV (bacterial vaginosis) 01/18/2014   History reviewed. No pertinent past surgical history. Family History  Problem Relation Age of Onset  . Diabetes Sister   . Cancer Paternal Grandmother     breast  . Asthma Son    Social History  Substance Use Topics  . Smoking status: Former Smoker    Types: Cigarettes  . Smokeless tobacco: Never Used  . Alcohol Use: Yes     Comment: occassional   OB History    Gravida Para Term Preterm AB TAB SAB Ectopic Multiple Living   Review of Systems  Musculoskeletal: Negative for joint swelling.  All other systems reviewed and are negative.     Allergies  Sulfa antibiotics  Home Medications   Prior to Admission medications   Medication Sig Start Date End Date Taking? Authorizing Provider  acetaminophen (TYLENOL) 500 MG tablet Take 500 mg by mouth every 6 (six) hours as needed for mild pain.   Yes Historical Provider, MD  famotidine (PEPCID) 20 MG tablet Take 1 tablet (20 mg total) by mouth 2  (two) times daily. 05/26/15  Yes Eber Hong, MD  medroxyPROGESTERone (DEPO-PROVERA) 150 MG/ML injection INJECT INTO THE MUSCLE EVERY 3 MONTHS. 05/07/15  Yes Adline Potter, NP  megestrol (MEGACE) 40 MG tablet Take 1 tablet (40 mg total) by mouth daily. 05/28/15  Yes Adline Potter, NP  minocycline (MINOCIN,DYNACIN) 100 MG capsule Take 100 mg by mouth 2 (two) times daily.   Yes Historical Provider, MD  HYDROcodone-acetaminophen (NORCO/VICODIN) 5-325 MG tablet Take 2 tablets by mouth every 4 (four) hours as needed. 06/15/15   Elson Areas, PA-C   BP 116/68 mmHg  Pulse 91  Temp(Src) 98.1 F (36.7 C) (Oral)  Resp 13  Ht  (1.626 m)  Wt 79.379 kg  BMI 30.02 kg/m2  SpO2 100%  LMP 06/12/2015 Physical Exam  Constitutional: She appears well-developed and well-nourished.  HENT:  Head: Normocephalic and atraumatic.  Musculoskeletal: She exhibits tenderness.  Tender right shoulder, obvious dislocation   Neurological: She is alert.  Skin: Skin is warm.  Psychiatric: She has a normal mood and affect.  Nursing note and vitals reviewed.   ED Course  Procedures (including critical care time) Labs Review Labs Reviewed - No data to display  Imaging Review Dg Shoulder Right Port  06/15/2015  CLINICAL DATA:  Reduction of dislocated right shoulder. EXAM: PORTABLE RIGHT SHOULDER - 2+ VIEW COMPARISON:  Film earlier today. FINDINGS: The right glenohumeral joint now shows normal alignment and there is no evidence of fracture.  No underlying bony lesion identified. IMPRESSION: Normal alignment of right glenohumeral joint after reduction of dislocation. No visible fracture. Electronically Signed   By: Irish LackGlenn  Yamagata M.D.   On: 06/15/2015 10:50   Dg Shoulder Right Port  06/15/2015  CLINICAL DATA:  Dislocation after sudden movement EXAM: PORTABLE RIGHT SHOULDER - 2+ VIEW COMPARISON:  October 27, 2014 FINDINGS: Frontal and Y scapular images obtained. There is a subcoracoid anterior dislocation.  No fracture evident. No appreciable arthropathy. Visualized right lung clear. IMPRESSION: Subcoracoid anterior dislocation.  No fracture evident. Electronically Signed   By: Bretta BangWilliam  Woodruff III M.D.   On: 06/15/2015 09:11   I have personally reviewed and evaluated these images and lab results as part of my medical decision-making.   EKG Interpretation None      MDM  Dr. Bebe Shaggywickline exaimed pt.   Pt relaxed while we were talking about procedure.   Pt's shoulder relocated spontaneously. Repeat xray normal alignment.  (no procedure done)   Final diagnoses:  Dislocation of right shoulder joint, initial encounter    Meds ordered this encounter  Medications  . HYDROmorphone (DILAUDID) injection 1 mg    Sig:   . ondansetron (ZOFRAN) injection 4 mg    Sig:   . 0.9 %  sodium chloride infusion    Sig:   . DISCONTD: HYDROmorphone (DILAUDID) 1 MG/ML injection    Sig:     Cruise, Jennifer   : cabinet override  . DISCONTD: ondansetron (ZOFRAN) 4 MG/2ML injection    Sig:     Cruise, Jennifer   : cabinet override  . minocycline (MINOCIN,DYNACIN) 100 MG capsule    Sig: Take 100 mg by mouth 2 (two) times daily.  Marland Kitchen. DISCONTD: propofol (DIPRIVAN) 10 mg/mL bolus/IV push 40 mg    Sig:   . HYDROcodone-acetaminophen (NORCO/VICODIN) 5-325 MG tablet    Sig: Take 2 tablets by mouth every 4 (four) hours as needed.    Dispense:  20 tablet    Refill:  0    Order Specific Question:  Supervising Provider    Answer:  Eber HongMILLER, BRIAN [[id number]]      Lonia SkinnerLeslie K ForestdaleSofia, PA-C 06/15/15 1733  Zadie Rhineonald Wickline, MD 06/16/15 1231

## 2015-06-15 NOTE — ED Notes (Signed)
Pt up ambulatory to bathroom without difficulty.  

## 2015-06-15 NOTE — ED Notes (Signed)
Starting conscious sedation and shoulder already back in place.  No sedation given.  X-rays ordered.

## 2015-06-15 NOTE — ED Notes (Signed)
Pt states she feels like she is going to pass out.   Pt placed in bed and checking vitals.  Starting IV.  Langston MaskerKaren Sofia notified.  Orders received.

## 2015-06-15 NOTE — Discharge Instructions (Signed)
Shoulder Dislocation Your shoulder joint is made up of 3 bones:  The upper arm bone (humerus).  The shoulder blade (scapula).  The collarbone (clavicle). A shoulder dislocation happens when your upper arm bone moves out of its normal place in your shoulder joint. HOME CARE If You Have a Splint or Sling:  Wear it as told by your doctor.  Take it off only as told by your doctor.  Loosen it if:  Your fingers become numb and tingly.  Your fingers turn cold and blue.  Keep it clean and dry. Bathing  Do not take baths, swim, or use a hot tub until your doctor says you can. Ask your doctor if you can take showers. You may only be allowed to take sponge baths.  If your doctor says taking baths or showers is okay, cover your splint or sling with a plastic bag. Do not let the splint or sling get wet. Managing Pain, Stiffness, and Swelling  If told, put ice on the injured area.  Put ice in a plastic bag.  Place a towel between your skin and the bag.  Leave the ice on for 20 minutes, 2-3 times per day.  Move your fingers often to avoid stiffness and to lessen swelling.  Raise (elevate) the injured area above the level of your heart while you are sitting or lying down. Driving  Do not drive while you are wearing a splint or sling on a hand that you use for driving.  Do not drive or operate heavy machinery while taking pain medicine. Activity  Return to your normal activities as told by your doctor. Ask your doctor what activities are safe for you.  Do range-of-motion exercises only as told by your doctor.  Exercise your hand by squeezing a soft ball. This keeps your hand and wrist from getting stiff and swollen. General Instructions  Take over-the-counter and prescription medicines only as told by your doctor.  Do not use any tobacco products, including cigarettes, chewing tobacco, or e-cigarettes. Tobacco can slow down healing. If you need help quitting, ask your  doctor.  Keep all follow-up visits as told by your doctor. This is important. GET HELP IF:  Your splint or sling gets damaged. GET HELP RIGHT AWAY IF:  Your pain gets worse instead of better.  You lose feeling in your arm or hand.  Your arm or hand turns white and cold.   This information is not intended to replace advice given to you by your health care provider. Make sure you discuss any questions you have with your health care provider.   Document Released: 05/05/2011 Document Revised: 11/01/2014 Document Reviewed: 06/05/2014 Elsevier Interactive Patient Education 2016 [employer].  How to Use a Shoulder Immobilizer A shoulder immobilizer is a device that you may have to wear after a shoulder injury or surgery. This device keeps your arm from moving. This prevents additional pain or injury. It also supports your arm next to your body as your shoulder heals. You may need to wear a shoulder immobilizer to treat a broken bone (fracture) in your shoulder. You may also need to wear one if you have an injury that moves your shoulder out of position (dislocation). There are different types of shoulder immobilizers. The one that you get depends on your injury. RISKS AND COMPLICATIONS Wearing a shoulder immobilizer in the wrong way can let your injured shoulder move around too much. This may delay healing and make your pain and swelling worse. HOW TO USE  YOUR SHOULDER IMMOBILIZER °· The part of the immobilizer that goes around your neck (sling) should support your upper arm, with your elbow bent and your lower arm and hand across your chest. °· Make sure that your elbow: °¨ Is snug against the back pocket of the sling. °¨ Does not move away from your body. °· The strap of the immobilizer should go over your shoulder and support your arm and hand. Your hand should be slightly higher than your elbow. It should not hang loosely over the edge of the sling. °· If the long strap has a pad, place it  where it is most comfortable on your neck. °· Carefully follow your health care provider's instructions for wearing your shoulder immobilizer. Your health care provider may want you to: °¨ Loosen your immobilizer to straighten your elbow and move your wrist and fingers. You may have to do this several times each day. Ask your health care provider when you should do this and how often. °¨ Remove your immobilizer once every day to shower, but limit the movement in your injured arm. Before putting the immobilizer back on, use a towel to dry the area under your arm completely. °¨ Remove your immobilizer to do shoulder exercises at home as directed by your health care provider. °¨ Wear your immobilizer while you sleep. You may sleep more comfortably if you have your upper body raised on pillows. °SEEK MEDICAL CARE IF: °· Your immobilizer is not supporting your arm properly. °· Your immobilizer gets damaged. °· You have worsening pain or swelling in your shoulder, arm, or hand. °· Your shoulder, arm, or hand changes color or temperature. °· You lose feeling in your shoulder, arm, or hand. °  °This information is not intended to replace advice given to you by your health care provider. Make sure you discuss any questions you have with your health care provider. °  °Document Released: 03/20/2004 Document Revised: 06/27/2014 Document Reviewed: 01/18/2014 °Elsevier Interactive Patient Education ©2016 Elsevier Inc. ° °

## 2015-06-15 NOTE — ED Notes (Signed)
Thinks she dislocated her right shoulder this morning.  History of same

## 2015-06-26 ENCOUNTER — Ambulatory Visit (INDEPENDENT_AMBULATORY_CARE_PROVIDER_SITE_OTHER): Payer: Medicaid Other | Admitting: Advanced Practice Midwife

## 2015-06-26 ENCOUNTER — Encounter: Payer: Self-pay | Admitting: Advanced Practice Midwife

## 2015-06-26 VITALS — BP 118/70 | HR 94 | Ht 64.0 in | Wt 174.0 lb

## 2015-06-26 DIAGNOSIS — Z308 Encounter for other contraceptive management: Secondary | ICD-10-CM | POA: Diagnosis not present

## 2015-06-26 DIAGNOSIS — R1084 Generalized abdominal pain: Secondary | ICD-10-CM

## 2015-06-26 NOTE — Progress Notes (Signed)
[facility] Visit  Patient name: [name] MRN [id number]  Date of birth: 04-08-1985  CC & HPI:  [name] is a 30 y.o. African American female presenting today for BTB on depo for the last several months. She was told to take megace 40mg /day.  That did not affect her bleeding.  However, she increased it to 16m and now her bleeding has stopped.  She may try Nelanon if bleeding doesn't resolve after next shot (due in May).  She has also been having generalized left sided abdominal pain for about a month.  Hurst worse when prone.  Feels "tender".  Pain is from a few inches above umbilicus down into pelvis. Has long hx of constipaiton. Hx of IBS, but has never been on meds.    Pertinent History Reviewed:  Medical & Surgical Hx:   Past Medical History  Diagnosis Date  . Shoulder dislocation, recurrent   . Contraceptive management 12/06/2013  . Vaginal discharge 01/18/2014  . BV (bacterial vaginosis) 01/18/2014   History reviewed. No pertinent past surgical history. Family History  Problem Relation Age of Onset  . Diabetes Sister   . Cancer Paternal Grandmother     breast  . Asthma Son     Current outpatient prescriptions:  .  acetaminophen (TYLENOL) 500 MG tablet, Take 500 mg by mouth every 6 (six) hours as needed for mild pain., Disp: , Rfl:  .  famotidine (PEPCID) 20 MG tablet, Take 1 tablet (20 mg total) by mouth 2 (two) times daily., Disp: 30 tablet, Rfl: 0 .  medroxyPROGESTERone (DEPO-PROVERA) 150 MG/ML injection, INJECT INTO THE MUSCLE EVERY 3 MONTHS., Disp: 1 mL, Rfl: 4 .  megestrol (MEGACE) 40 MG tablet, Take 1 tablet (40 mg total) by mouth daily., Disp: 30 tablet, Rfl: 1 Social History: Reviewed -  reports that she has quit smoking. Her smoking use included Cigarettes. She has never used smokeless tobacco.  Review of Systems:   Constitutional: Negative for fever and chills Eyes: Negative for visual disturbances Respiratory: Negative for shortness of  breath, dyspnea Cardiovascular: Negative for chest pain or palpitations  Gastrointestinal: Negative for vomiting, diarrhea and constipation; no abdominal pain Genitourinary: Negative for dysuria and urgency, vaginal irritation or itching Musculoskeletal: Negative for back pain, joint pain, myalgias  Neurological: Negative for dizziness and headaches    Objective Findings:    Physical Examination: General appearance - well appearing, and in no distress Mental status - alert, oriented to person, place, and time Chest:  Normal respiratory effort Heart - normal rate and regular rhythm Abdomen:  Soft, Sl tender to deep palpation, mainly on left side.  Pelvic: SSE:  No blood, normal appearing DC.  GC/CHL swab done.  Non tender to bimanula Musculoskeletal:  Normal range of motion without pain Extremities:  No edema    No results found for this or any previous visit (from the past 24 hour(s)).    Assessment & Plan:  A:   DUB 2/2 depo  Generalized abdominal pain, non acute.  I don't think it is GYN, seems more like IBS P:  Megace prn; may switch to Nexplanon.  May want to see GI md   Return if symptoms worsen or fail to improve.  CRESENZO-DISHMAN,Bodi Palmeri CNM 06/26/2015 3:20 PM

## 2015-06-28 LAB — GC/CHLAMYDIA PROBE AMP
Chlamydia trachomatis, NAA: NEGATIVE
NEISSERIA GONORRHOEAE BY PCR: NEGATIVE

## 2015-07-23 ENCOUNTER — Inpatient Hospital Stay (HOSPITAL_COMMUNITY)
Admission: AD | Admit: 2015-07-23 | Discharge: 2015-07-24 | Disposition: A | Discharge status: Home / Self Care | Payer: Self-pay | Source: Ambulatory Visit | Attending: Family Medicine | Admitting: Family Medicine

## 2015-07-23 ENCOUNTER — Encounter (HOSPITAL_COMMUNITY): Payer: Self-pay | Admitting: *Deleted

## 2015-07-23 DIAGNOSIS — D649 Anemia, unspecified: Secondary | ICD-10-CM | POA: Insufficient documentation

## 2015-07-23 DIAGNOSIS — N390 Urinary tract infection, site not specified: Secondary | ICD-10-CM | POA: Insufficient documentation

## 2015-07-23 DIAGNOSIS — N946 Dysmenorrhea, unspecified: Secondary | ICD-10-CM | POA: Insufficient documentation

## 2015-07-23 DIAGNOSIS — N921 Excessive and frequent menstruation with irregular cycle: Secondary | ICD-10-CM

## 2015-07-23 DIAGNOSIS — Z87891 Personal history of nicotine dependence: Secondary | ICD-10-CM | POA: Insufficient documentation

## 2015-07-23 DIAGNOSIS — N3000 Acute cystitis without hematuria: Secondary | ICD-10-CM

## 2015-07-23 LAB — URINALYSIS, ROUTINE W REFLEX MICROSCOPIC
Bilirubin Urine: NEGATIVE
GLUCOSE, UA: NEGATIVE mg/dL
KETONES UR: 15 mg/dL — AB
Nitrite: POSITIVE — AB
PH: 5.5 (ref 5.0–8.0)
PROTEIN: 100 mg/dL — AB
Specific Gravity, Urine: 1.025 (ref 1.005–1.030)

## 2015-07-23 LAB — CBC
HEMATOCRIT: 30.5 % — AB (ref 36.0–46.0)
Hemoglobin: 9.9 g/dL — ABNORMAL LOW (ref 12.0–15.0)
MCH: 26.1 pg (ref 26.0–34.0)
MCHC: 32.5 g/dL (ref 30.0–36.0)
MCV: 80.3 fL (ref 78.0–100.0)
PLATELETS: 194 10*3/uL (ref 150–400)
RBC: 3.8 MIL/uL — ABNORMAL LOW (ref 3.87–5.11)
RDW: 14 % (ref 11.5–15.5)
WBC: 6.9 10*3/uL (ref 4.0–10.5)

## 2015-07-23 LAB — URINE MICROSCOPIC-ADD ON

## 2015-07-23 LAB — POCT PREGNANCY, URINE: Preg Test, Ur: NEGATIVE

## 2015-07-23 MED ORDER — FERROUS SULFATE 325 (65 FE) MG PO TABS: 325.0000 mg | ORAL_TABLET | Freq: Every day | ORAL | Status: DC

## 2015-07-23 MED ORDER — CIPROFLOXACIN HCL 250 MG PO TABS: 250.0000 mg | ORAL_TABLET | Freq: Two times a day (BID) | ORAL | Status: DC

## 2015-07-23 MED ORDER — HYDROMORPHONE HCL 1 MG/ML IJ SOLN
1.0000 mg | Freq: Once | INTRAMUSCULAR | Status: AC
  Administered 2015-07-23: 1 mg via INTRAMUSCULAR
  Filled 2015-07-23: qty 1

## 2015-07-23 MED ORDER — NORGESTIMATE-ETH ESTRADIOL 0.25-35 MG-MCG PO TABS: 1.0000 | ORAL_TABLET | Freq: Every day | ORAL | Status: DC

## 2015-07-23 NOTE — MAU Note (Signed)
Pt states she has been having heavy bleeding since the end of Feb, has also been having lower abd pain that length of time. States she is passing really big clots.

## 2015-07-23 NOTE — MAU Provider Note (Signed)
History     CSN: [id number]  Arrival date and time: 07/23/15 2116   First Provider Initiated Contact with Patient 07/23/15 2153      Chief Complaint  Patient presents with  . Vaginal Bleeding   HPI Ms. [name] is a 30 y.o. [phone] who presents to MAU today with complaint of vaginal bleeding. The patient states that she has been on Depo Provera since February after having IUD removed due to frequent pelvic pain. She states moderate associated lower abdominal pain rated at 7/10 now. She was taking Ibuprofen for pain, but has not taken any for ~ 3 days. She states bleeding all but ~ 3 days since February. She has tried Megace with minimal impact in bleeding pattern. She denies weakness, dizziness or UTI symptoms today. She is concerned about anemia because she has been craving ice and flour.   OB History    Gravida Para Term Preterm AB TAB SAB Ectopic Multiple Living   Past Medical History  Diagnosis Date  . Shoulder dislocation, recurrent   . Contraceptive management 12/06/2013  . Vaginal discharge 01/18/2014  . BV (bacterial vaginosis) 01/18/2014    Past Surgical History  Procedure Laterality Date  . Wisdom tooth extraction      Family History  Problem Relation Age of Onset  . Diabetes Sister   . Cancer Paternal Grandmother     breast  . Asthma Son     Social History  Substance Use Topics  . Smoking status: Former Smoker    Types: Cigarettes    Quit date: 07/22/2004  . Smokeless tobacco: Never Used  . Alcohol Use: Yes     Comment: occassional    Allergies:  Allergies  Allergen Reactions  . Sulfa Antibiotics Rash    No prescriptions prior to admission    Review of Systems  Constitutional: Negative for fever and malaise/fatigue.  Gastrointestinal: Positive for abdominal pain. Negative for nausea, vomiting, diarrhea and constipation.  Genitourinary: Negative for dysuria, urgency and frequency.       + vaginal bleeding    Physical Exam   Blood pressure 122/64, pulse 73, temperature 98.2 F (36.8 C), temperature source Oral, resp. rate 16, height  (1.626 m), weight 169 lb (76.658 kg), last menstrual period 04/11/2015, SpO2 100 %.  Physical Exam  Nursing note and vitals reviewed. Constitutional: She is oriented to person, place, and time. She appears well-developed and well-nourished. No distress.  HENT:  Head: Normocephalic and atraumatic.  Cardiovascular: Normal rate.   Respiratory: Effort normal.  GI: Soft. She exhibits no distension and no mass. There is no tenderness. There is no rebound and no guarding.  Genitourinary: Uterus is not enlarged and not tender. Cervix exhibits no motion tenderness, no discharge and no friability. Right adnexum displays no mass and no tenderness. Left adnexum displays no mass and no tenderness. There is bleeding (small blood and one very small clot) in the vagina. No vaginal discharge found.  Neurological: She is alert and oriented to person, place, and time.  Skin: Skin is warm and dry. No erythema.  Psychiatric: She has a normal mood and affect.    Results for orders placed or performed during the hospital encounter of 07/23/15 (from the past 24 hour(s))  CBC     Status: Abnormal   Collection Time: 07/23/15  9:57 PM  Result Value Ref Range   WBC 6.9 4.0 - 10.5 K/uL  RBC 3.80 (L) 3.87 - 5.11 MIL/uL   Hemoglobin 9.9 (L) 12.0 - 15.0 g/dL   HCT 45.430.5 (L) 09.836.0 - 11.946.0 %   MCV 80.3 78.0 - 100.0 fL   MCH 26.1 26.0 - 34.0 pg   MCHC 32.5 30.0 - 36.0 g/dL   RDW 14.714.0 82.911.5 - 56.215.5 %   Platelets 194 150 - 400 K/uL  Pregnancy, urine POC     Status: None   Collection Time: 07/23/15 10:23 PM  Result Value Ref Range   Preg Test, Ur NEGATIVE NEGATIVE  Urinalysis, Routine w reflex microscopic (not [facility])     Status: Abnormal   Collection Time: 07/23/15 10:45 PM  Result Value Ref Range   Color, Urine RED (A) YELLOW   APPearance HAZY (A) CLEAR   Specific Gravity, Urine  1.025 1.005 - 1.030   pH 5.5 5.0 - 8.0   Glucose, UA NEGATIVE NEGATIVE mg/dL   Hgb urine dipstick LARGE (A) NEGATIVE   Bilirubin Urine NEGATIVE NEGATIVE   Ketones, ur 15 (A) NEGATIVE mg/dL   Protein, ur 130100 (A) NEGATIVE mg/dL   Nitrite POSITIVE (A) NEGATIVE   Leukocytes, UA TRACE (A) NEGATIVE  Urine microscopic-add on     Status: Abnormal   Collection Time: 07/23/15 10:45 PM  Result Value Ref Range   Squamous Epithelial / LPF 0-5 (A) NONE SEEN   WBC, UA 0-5 0 - 5 WBC/hpf   RBC / HPF TOO NUMEROUS TO COUNT 0 - 5 RBC/hpf   Bacteria, UA MANY (A) NONE SEEN    MAU Course  Procedures None  MDM UPT - negative UA, CBC today  Reviewed results from recent visit at [facility], negative for infection, patient declines need for repeat testing today.  Patient is hemodynamically stable, but has had significant decline in Hgb since last visit.  Urine culture pending Assessment and Plan  A: BTB on Depo Provera Dysmenorrhea Anemia  UTI  P: Discharge home Rx for Sprintec, Iron Supplements and Cipro given to patient  Bleeding precautions discussed Urine culture pending Patient advised to follow-up with [facility] in 2-3 months or sooner if bleeding remains unresponsive to change in birth control/therapy Patient may return to MAU as needed or if her condition were to change or worsen   Marny LowensteinJulie N Javari Bufkin, PA-C  07/24/2015, 12:53 AM

## 2015-07-23 NOTE — Discharge Instructions (Signed)
Abnormal Uterine Bleeding °Abnormal uterine bleeding means bleeding from the vagina that is not your normal menstrual period. This can be: °· Bleeding or spotting between periods. °· Bleeding after sex (sexual intercourse). °· Bleeding that is heavier or more than normal. °· Periods that last longer than usual. °· Bleeding after menopause. °There are many problems that may cause this. Treatment will depend on the cause of the bleeding. Any kind of bleeding that is not normal should be reviewed by your doctor.  °HOME CARE °Watch your condition for any changes. These actions may lessen any discomfort you are having: °· Do not use tampons or douches as told by your doctor. °· Change your pads often. °You should get regular pelvic exams and Pap tests. Keep all appointments for tests as told by your doctor. °GET HELP IF: °· You are bleeding for more than 1 week. °· You feel dizzy at times. °GET HELP RIGHT AWAY IF:  °· You pass out. °· You have to change pads every 15 to 30 minutes. °· You have belly pain. °· You have a fever. °· You become sweaty or weak. °· You are passing large blood clots from the vagina. °· You feel sick to your stomach (nauseous) and throw up (vomit). °MAKE SURE YOU: °· Understand these instructions. °· Will watch your condition. °· Will get help right away if you are not doing well or get worse. °  °This information is not intended to replace advice given to you by your health care provider. Make sure you discuss any questions you have with your health care provider. °  °Document Released: 12/08/2008 Document Revised: 02/15/2013 Document Reviewed: 09/09/2012 °Elsevier Interactive Patient Education ©2016 Elsevier Inc. ° °Oral Contraception Information °Oral contraceptive pills (OCPs) are medicines taken to prevent pregnancy. OCPs work by preventing the ovaries from releasing eggs. The hormones in OCPs also cause the cervical mucus to thicken, preventing the sperm from entering the uterus. The  hormones also cause the uterine lining to become thin, not allowing a fertilized egg to attach to the inside of the uterus. OCPs are highly effective when taken exactly as prescribed. However, OCPs do not prevent sexually transmitted diseases (STDs). Safe sex practices, such as using condoms along with the pill, can help prevent STDs.  °Before taking the pill, you may have a physical exam and Pap test. Your health care provider may order blood tests. The health care provider will make sure you are a good candidate for oral contraception. Discuss with your health care provider the possible side effects of the OCP you may be prescribed. When starting an OCP, it can take 2 to 3 months for the body to adjust to the changes in hormone levels in your body.  °TYPES OF ORAL CONTRACEPTION °· The combination pill--This pill contains estrogen and progestin (synthetic progesterone) hormones. The combination pill comes in 21-day, 28-day, or 91-day packs. Some types of combination pills are meant to be taken continuously (365-day pills). With 21-day packs, you do not take pills for 7 days after the last pill. With 28-day packs, the pill is taken every day. The last 7 pills are without hormones. Certain types of pills have more than 21 hormone-containing pills. With 91-day packs, the first 84 pills contain both hormones, and the last 7 pills contain no hormones or contain estrogen only. °· The minipill--This pill contains the progesterone hormone only. The pill is taken every day continuously. It is very important to take the pill at the same time each   day. The minipill comes in packs of 28 pills. All 28 pills contain the hormone.   °ADVANTAGES OF ORAL CONTRACEPTIVE PILLS °· Decreases premenstrual symptoms.   °· Treats menstrual period cramps.   °· Regulates the menstrual cycle.   °· Decreases a heavy menstrual flow.   °· May treat acne, depending on the type of pill.   °· Treats abnormal uterine bleeding.   °· Treats polycystic  ovarian syndrome.   °· Treats endometriosis.   °· Can be used as emergency contraception.   °THINGS THAT CAN MAKE ORAL CONTRACEPTIVE PILLS LESS EFFECTIVE °OCPs can be less effective if:  °· You forget to take the pill at the same time every day.   °· You have a stomach or intestinal disease that lessens the absorption of the pill.   °· You take OCPs with other medicines that make OCPs less effective, such as antibiotics, certain HIV medicines, and some seizure medicines.   °· You take expired OCPs.   °· You forget to restart the pill on day 7, when using the packs of 21 pills.   °RISKS ASSOCIATED WITH ORAL CONTRACEPTIVE PILLS  °Oral contraceptive pills can sometimes cause side effects, such as: °· Headache. °· Nausea. °· Breast tenderness. °· Irregular bleeding or spotting. °Combination pills are also associated with a small increased risk of: °· Blood clots. °· Heart attack. °· Stroke. °  °This information is not intended to replace advice given to you by your health care provider. Make sure you discuss any questions you have with your health care provider. °  °Document Released: 05/03/2002 Document Revised: 12/01/2012 Document Reviewed: 08/01/2012 °Elsevier Interactive Patient Education ©2016 Elsevier Inc. ° °

## 2015-07-25 LAB — URINE CULTURE

## 2015-07-30 ENCOUNTER — Ambulatory Visit: Payer: Medicaid Other

## 2015-08-22 ENCOUNTER — Other Ambulatory Visit: Payer: Self-pay | Admitting: Advanced Practice Midwife

## 2015-08-22 ENCOUNTER — Telehealth: Payer: Self-pay | Admitting: Advanced Practice Midwife

## 2015-08-22 DIAGNOSIS — Z8719 Personal history of other diseases of the digestive system: Secondary | ICD-10-CM

## 2015-08-22 NOTE — Progress Notes (Signed)
Pt requested referral to gastro d/t hx IBS-C.  Referral made to [facility]

## 2015-08-22 NOTE — Telephone Encounter (Signed)
Pt called stating that she would like for [name] to refer her to a gastro doctor, Please contact pt

## 2015-08-22 NOTE — Telephone Encounter (Signed)
Referral made in EPIC.  LEt us know if they don't call her for an appt within a week

## 2015-08-23 NOTE — Telephone Encounter (Signed)
[facility] if does not hear from there office within 1 week call our office back.

## 2015-08-27 ENCOUNTER — Encounter: Payer: Self-pay | Admitting: Internal Medicine

## 2015-09-01 ENCOUNTER — Encounter (HOSPITAL_COMMUNITY): Payer: Self-pay

## 2015-09-01 ENCOUNTER — Inpatient Hospital Stay (HOSPITAL_COMMUNITY)
Admission: AD | Admit: 2015-09-01 | Discharge: 2015-09-01 | Disposition: A | Discharge status: Home / Self Care | Payer: Medicaid Other | Source: Ambulatory Visit | Attending: Obstetrics and Gynecology | Admitting: Obstetrics and Gynecology

## 2015-09-01 DIAGNOSIS — R102 Pelvic and perineal pain: Secondary | ICD-10-CM | POA: Diagnosis not present

## 2015-09-01 DIAGNOSIS — N939 Abnormal uterine and vaginal bleeding, unspecified: Secondary | ICD-10-CM | POA: Diagnosis present

## 2015-09-01 DIAGNOSIS — R3915 Urgency of urination: Secondary | ICD-10-CM | POA: Insufficient documentation

## 2015-09-01 DIAGNOSIS — Z882 Allergy status to sulfonamides status: Secondary | ICD-10-CM | POA: Insufficient documentation

## 2015-09-01 DIAGNOSIS — K5909 Other constipation: Secondary | ICD-10-CM

## 2015-09-01 DIAGNOSIS — K5900 Constipation, unspecified: Secondary | ICD-10-CM | POA: Insufficient documentation

## 2015-09-01 DIAGNOSIS — Z87891 Personal history of nicotine dependence: Secondary | ICD-10-CM | POA: Insufficient documentation

## 2015-09-01 DIAGNOSIS — D500 Iron deficiency anemia secondary to blood loss (chronic): Secondary | ICD-10-CM | POA: Diagnosis not present

## 2015-09-01 DIAGNOSIS — R350 Frequency of micturition: Secondary | ICD-10-CM

## 2015-09-01 LAB — URINALYSIS, ROUTINE W REFLEX MICROSCOPIC
BILIRUBIN URINE: NEGATIVE
GLUCOSE, UA: NEGATIVE mg/dL
HGB URINE DIPSTICK: NEGATIVE
KETONES UR: NEGATIVE mg/dL
Leukocytes, UA: NEGATIVE
NITRITE: NEGATIVE
PH: 5.5 (ref 5.0–8.0)
Protein, ur: NEGATIVE mg/dL

## 2015-09-01 LAB — CBC
HEMATOCRIT: 27.4 % — AB (ref 36.0–46.0)
Hemoglobin: 8.3 g/dL — ABNORMAL LOW (ref 12.0–15.0)
MCH: 22.1 pg — AB (ref 26.0–34.0)
MCHC: 30.3 g/dL (ref 30.0–36.0)
MCV: 73.1 fL — AB (ref 78.0–100.0)
PLATELETS: 236 10*3/uL (ref 150–400)
RBC: 3.75 MIL/uL — ABNORMAL LOW (ref 3.87–5.11)
RDW: 16.8 % — AB (ref 11.5–15.5)
WBC: 4.9 10*3/uL (ref 4.0–10.5)

## 2015-09-01 LAB — WET PREP, GENITAL
CLUE CELLS WET PREP: NONE SEEN
Sperm: NONREACTIVE
Trich, Wet Prep: NONE SEEN
Yeast Wet Prep HPF POC: NONE SEEN

## 2015-09-01 LAB — POCT PREGNANCY, URINE: Preg Test, Ur: NEGATIVE

## 2015-09-01 MED ORDER — POLYETHYLENE GLYCOL 3350 17 GM/SCOOP PO POWD: 17.0000 g | Freq: Two times a day (BID) | ORAL | Status: DC

## 2015-09-01 MED ORDER — ACETAMINOPHEN 500 MG PO TABS: 1000.0000 mg | ORAL_TABLET | Freq: Four times a day (QID) | ORAL | Status: DC | PRN

## 2015-09-01 MED ORDER — MEGESTROL ACETATE 40 MG PO TABS
80.0000 mg | ORAL_TABLET | Freq: Once | ORAL | Status: AC
  Administered 2015-09-01: 80 mg via ORAL
  Filled 2015-09-01: qty 2

## 2015-09-01 MED ORDER — PHENAZOPYRIDINE HCL 100 MG PO TABS
200.0000 mg | ORAL_TABLET | Freq: Once | ORAL | Status: AC
  Administered 2015-09-01: 200 mg via ORAL
  Filled 2015-09-01: qty 2

## 2015-09-01 MED ORDER — FERROUS SULFATE 325 (65 FE) MG PO TABS: 325.0000 mg | ORAL_TABLET | Freq: Two times a day (BID) | ORAL | Status: DC

## 2015-09-01 MED ORDER — MEGESTROL ACETATE 20 MG PO TABS: 80.0000 mg | ORAL_TABLET | Freq: Two times a day (BID) | ORAL | Status: DC

## 2015-09-01 MED ORDER — PHENAZOPYRIDINE HCL 200 MG PO TABS: 200.0000 mg | ORAL_TABLET | Freq: Three times a day (TID) | ORAL | Status: DC | PRN

## 2015-09-01 NOTE — Discharge Instructions (Signed)
Abnormal Uterine Bleeding Abnormal uterine bleeding can affect women at various stages in life, including teenagers, women in their reproductive years, pregnant women, and women who have reached menopause. Several kinds of uterine bleeding are considered abnormal, including:  Bleeding or spotting between periods.   Bleeding after sexual intercourse.   Bleeding that is heavier or more than normal.   Periods that last longer than usual.  Bleeding after menopause.  Many cases of abnormal uterine bleeding are minor and simple to treat, while others are more serious. Any type of abnormal bleeding should be evaluated by your health care provider. Treatment will depend on the cause of the bleeding. HOME CARE INSTRUCTIONS Monitor your condition for any changes. The following actions may help to alleviate any discomfort you are experiencing:  Avoid the use of tampons and douches as directed by your health care provider.  Change your pads frequently. You should get regular pelvic exams and Pap tests. Keep all follow-up appointments for diagnostic tests as directed by your health care provider.  SEEK MEDICAL CARE IF:   Your bleeding lasts more than 1 week.   You feel dizzy at times.  SEEK IMMEDIATE MEDICAL CARE IF:   You pass out.   You are changing pads every 15 to 30 minutes.   You have abdominal pain.  You have a fever.   You become sweaty or weak.   You are passing large blood clots from the vagina.   You start to feel nauseous and vomit. MAKE SURE YOU:   Understand these instructions.  Will watch your condition.  Will get help right away if you are not doing well or get worse.   This information is not intended to replace advice given to you by your health care provider. Make sure you discuss any questions you have with your health care provider.   Document Released: 02/10/2005 Document Revised: 02/15/2013 Document Reviewed: 09/09/2012 Elsevier Interactive  Patient Education 2016 [employer].  Iron-Rich Diet Iron is a mineral that helps your body to produce hemoglobin. Hemoglobin is a protein in your red blood cells that carries oxygen to your body's tissues. Eating too little iron may cause you to feel weak and tired, and it can increase your risk for infection. Eating enough iron is necessary for your body's metabolism, muscle function, and nervous system. Iron is naturally found in many foods. It can also be added to foods or fortified in foods. There are two types of dietary iron:  Heme iron. Heme iron is absorbed by the body more easily than nonheme iron. Heme iron is found in meat, poultry, and fish.  Nonheme iron. Nonheme iron is found in dietary supplements, iron-fortified grains, beans, and vegetables. You may need to follow an iron-rich diet if:  You have been diagnosed with iron deficiency or iron-deficiency anemia.  You have a condition that prevents you from absorbing dietary iron, such as:  Infection in your intestines.  Celiac disease. This involves long-lasting (chronic) inflammation of your intestines.  You do not eat enough iron.  You eat a diet that is high in foods that impair iron absorption.  You have lost a lot of blood.  You have heavy bleeding during your menstrual cycle.  You are pregnant. WHAT IS MY PLAN? Your health care provider may help you to determine how much iron you need per day based on your condition. Generally, when a person consumes sufficient amounts of iron in the diet, the following iron needs are met:  Men.  103-66 years old: 11 mg per day.  70-2 years old: 8 mg per day.  Women.   55-76 years old: 15 mg per day.  14-18 years old: 18 mg per day.  Over 85 years old: 8 mg per day.  Pregnant women: 27 mg per day.  Breastfeeding women: 9 mg per day. WHAT DO I NEED TO KNOW ABOUT AN IRON-RICH DIET?  Eat fresh fruits and vegetables that are high in vitamin C along with foods that  are high in iron. This will help increase the amount of iron that your body absorbs from food, especially with foods containing nonheme iron. Foods that are high in vitamin C include oranges, peppers, tomatoes, and mango.  Take iron supplements only as directed by your health care provider. Overdose of iron can be life-threatening. If you were prescribed iron supplements, take them with orange juice or a vitamin C supplement.  Cook foods in pots and pans that are made from iron.   Eat nonheme iron-containing foods alongside foods that are high in heme iron. This helps to improve your iron absorption.   Certain foods and drinks contain compounds that impair iron absorption. Avoid eating these foods in the same meal as iron-rich foods or with iron supplements. These include:  Coffee, black tea, and red wine.  Milk, dairy products, and foods that are high in calcium.  Beans, soybeans, and peas.  Whole grains.  When eating foods that contain both nonheme iron and compounds that impair iron absorption, follow these tips to absorb iron better.   Soak beans overnight before cooking.  Soak whole grains overnight and drain them before using.  Ferment flours before baking, such as using yeast in bread dough. WHAT FOODS CAN I EAT? Grains Iron-fortified breakfast cereal. Iron-fortified whole-wheat bread. Enriched rice. Sprouted grains. Vegetables Spinach. Potatoes with skin. Green peas. Broccoli. Red and green bell peppers. Fermented vegetables. Fruits Prunes. Raisins. Oranges. Strawberries. Mango. Grapefruit. Meats and Other Protein Sources Beef liver. Oysters. Beef. Shrimp. [place]. Chicken. [place]. Sardines. Chickpeas. Nuts. Tofu. Beverages Tomato juice. Fresh orange juice. Prune juice. Hibiscus tea. Fortified instant breakfast shakes. Condiments Tahini. Fermented soy sauce. Sweets and Desserts Black-strap molasses.  Other Wheat germ. The items listed above may not be a  complete list of recommended foods or beverages. Contact your dietitian for more options. WHAT FOODS ARE NOT RECOMMENDED? Grains Whole grains. Bran cereal. Bran flour. Oats. Vegetables Artichokes. Brussels sprouts. Kale. Fruits Blueberries. Raspberries. Strawberries. Figs. Meats and Other Protein Sources Soybeans. Products made from soy protein. Dairy Milk. Cream. Cheese. Yogurt. Cottage cheese. Beverages Coffee. Black tea. Red wine. Sweets and Desserts Cocoa. Chocolate. Ice cream. Other Basil. Oregano. Parsley. The items listed above may not be a complete list of foods and beverages to avoid. Contact your dietitian for more information.   This information is not intended to replace advice given to you by your health care provider. Make sure you discuss any questions you have with your health care provider.   Document Released: 09/24/2004 Document Revised: 03/03/2014 Document Reviewed: 09/07/2013 Elsevier Interactive Patient Education 2016 [place].  Endometrial Ablation Endometrial ablation removes the lining of the uterus (endometrium). It is usually a same-day, outpatient treatment. Ablation helps avoid major surgery, such as surgery to remove the cervix and uterus (hysterectomy). After endometrial ablation, you will have little or no menstrual bleeding and may not be able to have children. However, if you are premenopausal, you will need to use a reliable method of birth control following the procedure  because of the small chance that pregnancy can occur. There are different reasons to have this procedure. These reasons include:  Heavy periods.  Bleeding that is causing anemia.  Irregular bleeding.  Bleeding fibroids on the lining inside the uterus if they are smaller than 3 centimeters. This procedure may not be possible for you if:   You want to have children in the future.   You have severe cramps with your menstrual period.   You have precancerous or  cancerous cells in your uterus.   You were recently pregnant.   You have gone through menopause.   You have had major surgery on your uterus, resulting in thinning of the uterine wall. Surgeries may include:  The removal of one or more uterine fibroids (myomectomy).  A cesarean section with a classic (vertical) incision on your uterus. Ask your health care provider what type of cesarean you had. Sometimes the scar on your skin is different than the scar on your uterus. Even if you have had surgery on your uterus, certain types of ablation may still be safe for you. Talk with your health care provider. LET [facility] CARE PROVIDER KNOW ABOUT:  Any allergies you have.  All medicines you are taking, including vitamins, herbs, eye drops, creams, and over-the-counter medicines.  Previous problems you or members of your family have had with the use of anesthetics.  Any blood disorders you have.  Previous surgeries you have had.  Medical conditions you have. RISKS AND COMPLICATIONS  Generally, this is a safe procedure. However, as with any procedure, complications can occur. Possible complications include:  Perforation of the uterus.  Bleeding.  Infection of the uterus, bladder, or vagina.  Injury to surrounding organs.  An air bubble to the lung (air embolus).  Pregnancy following the procedure.  Failure of the procedure to help the problem, requiring hysterectomy.  Decreased ability to diagnose cancer in the lining of the uterus. BEFORE THE PROCEDURE  The lining of the uterus must be tested to make sure there is no pre-cancerous or cancer cells present.  An ultrasound may be performed to look at the size of the uterus and to check for abnormalities.  Medicines may be given to thin the lining of the uterus. PROCEDURE  During the procedure, your health care provider will use a tool called a resectoscope to help see inside your uterus. There are different ways to remove  the lining of your uterus.   Radiofrequency - This method uses a radiofrequency-alternating electric current to remove the lining of the uterus.  Cryotherapy - This method uses extreme cold to freeze the lining of the uterus.  Heated-Free Liquid - This method uses heated salt (saline) solution to remove the lining of the uterus.  Microwave - This method uses high-energy microwaves to heat up the lining of the uterus to remove it.  Thermal balloon - This method involves inserting a catheter with a balloon tip into the uterus. The balloon tip is filled with heated fluid to remove the lining of the uterus. AFTER THE PROCEDURE  After your procedure, do not have sexual intercourse or insert anything into your vagina until permitted by your health care provider. After the procedure, you may experience:  Cramps.  Vaginal discharge.  Frequent urination.   This information is not intended to replace advice given to you by your health care provider. Make sure you discuss any questions you have with your health care provider.   Document Released: 12/21/2003 Document Revised: 11/01/2014  Document Reviewed: 07/14/2012 Elsevier Interactive Patient Education [employer].  Constipation, Adult Constipation is when a person has fewer than three bowel movements a week, has difficulty having a bowel movement, or has stools that are dry, hard, or larger than normal. As people grow older, constipation is more common. A low-fiber diet, not taking in enough fluids, and taking certain medicines may make constipation worse.  CAUSES   Certain medicines, such as antidepressants, pain medicine, iron supplements, antacids, and water pills.   Certain diseases, such as diabetes, irritable bowel syndrome (IBS), thyroid disease, or depression.   Not drinking enough water.   Not eating enough fiber-rich foods.   Stress or travel.   Lack of physical activity or exercise.   Ignoring the urge to have  a bowel movement.   Using laxatives too much.  SIGNS AND SYMPTOMS   Having fewer than three bowel movements a week.   Straining to have a bowel movement.   Having stools that are hard, dry, or larger than normal.   Feeling full or bloated.   Pain in the lower abdomen.   Not feeling relief after having a bowel movement.  DIAGNOSIS  Your health care provider will take a medical history and perform a physical exam. Further testing may be done for severe constipation. Some tests may include:  A barium enema X-ray to examine your rectum, colon, and, sometimes, your small intestine.   A sigmoidoscopy to examine your lower colon.   A colonoscopy to examine your entire colon. TREATMENT  Treatment will depend on the severity of your constipation and what is causing it. Some dietary treatments include drinking more fluids and eating more fiber-rich foods. Lifestyle treatments may include regular exercise. If these diet and lifestyle recommendations do not help, your health care provider may recommend taking over-the-counter laxative medicines to help you have bowel movements. Prescription medicines may be prescribed if over-the-counter medicines do not work.  HOME CARE INSTRUCTIONS   Eat foods that have a lot of fiber, such as fruits, vegetables, whole grains, and beans.  Limit foods high in fat and processed sugars, such as french fries, hamburgers, cookies, candies, and soda.   A fiber supplement may be added to your diet if you cannot get enough fiber from foods.   Drink enough fluids to keep your urine clear or pale yellow.   Exercise regularly or as directed by your health care provider.   Go to the restroom when you have the urge to go. Do not hold it.   Only take over-the-counter or prescription medicines as directed by your health care provider. Do not take other medicines for constipation without talking to your health care provider first.  [place] IF:   You have bright red blood in your stool.   Your constipation lasts for more than 4 days or gets worse.   You have abdominal or rectal pain.   You have thin, pencil-like stools.   You have unexplained weight loss. MAKE SURE YOU:   Understand these instructions.  Will watch your condition.  Will get help right away if you are not doing well or get worse.   This information is not intended to replace advice given to you by your health care provider. Make sure you discuss any questions you have with your health care provider.   Document Released: 11/09/2003 Document Revised: 03/03/2014 Document Reviewed: 11/22/2012 Elsevier Interactive Patient Education [employer].

## 2015-09-01 NOTE — MAU Note (Signed)
Patient presents with c/o vaginal bleeding since February was on Depo was switched to BCP still has not stopped bleeding, having pelvic pain, and what sounds like acid reflux, also having urinary frequency but only voiding small amounts.

## 2015-09-01 NOTE — MAU Provider Note (Signed)
Chief Complaint: Pelvic Pain; Vaginal Bleeding; and Heartburn  First Provider Initiated Contact with Patient 09/01/15 1240      SUBJECTIVE HPI: [name] is a 30 y.o. [id number] female who presents to [facility] reporting vaginal bleeding since February 2017, new-onset pelvic pain in the last few days, generalized abdominal tenderness for a few months, urinary urgency, frequency, but only able To void small amount. The [facility] to [facility]. Has a history of abnormal uterine bleeding. Was placed on birth control pills many years ago and achieved good cycle control. More recently was put on Depo-Provera injections and had good control of menstrual bleeding, but after her last Depo-Provera injection in March she has had almost nonstop bleeding. Discussed this with her provider at [facility] and was started on Megace 40 -80 mg which had worked for her in the past for episodes of prolonged or heavy bleeding but did not work this time around.   Patient is tearful and very frustrated about this prolonged episode of bleeding. Wants to know why she is bleeding and is concerned that the medications that used to work are not working anymore. Has been feeling very tired and started craving ice, laundry starch and flour and wonders if this is related to her bleeding.  Has [facility] this [facility] and was referred to GI, but appt is not available until August. Last bowel movement 5 days ago. This is typical for patient lately.  Pelvic pain Quality: cramping Severity: 7/10 on pain scale Duration: few days Radiates to low back Context: None Timing: intermittent Modifying factors: None Associated signs and symptoms: Positive for vaginal bleeding, constipation, urinary frequency and urgency. Negative for fever, chills, vaginal discharge, , nausea, vomiting, diarrhea.  Generalized abdominal soreness Severity: Mild Duration: Few  months Context: None Timing: Intermittent Modifying factors: none. Hasn't tried anything for discomfort. Associated signs and symptoms: Positive for constipation. Negative for fever, chills, nausea, vomiting, diarrhea, bloody stools.  Past Medical History  Diagnosis Date  . Shoulder dislocation, recurrent   . Contraceptive management 12/06/2013  . Vaginal discharge 01/18/2014  . BV (bacterial vaginosis) 01/18/2014   OB History  Gravida Para Term Preterm AB SAB TAB Ectopic Multiple Living  3 3 3       3     # Outcome Date GA Lbr Len/2nd Weight Sex Delivery Anes PTL Lv  3 Term 12/02/08 7579w0d  7 lb 10 oz (3.459 kg) M Vag-Spont   Y  2 Term 08/18/06 7679w0d  7 lb 4 oz (3.289 kg) F Vag-Spont   Y  1 Term 08/14/04 8179w0d  7 lb 9 oz (3.43 kg) M Vag-Spont EPI  Y     Past Surgical History  Procedure Laterality Date  . Wisdom tooth extraction     Social History   Social History  . Marital Status: Married    Spouse Name: N/A  . Number of Children: N/A  . Years of Education: N/A   Occupational History  . Not on file.   Social History Main Topics  . Smoking status: Former Smoker    Types: Cigarettes    Quit date: 07/22/2004  . Smokeless tobacco: Never Used  . Alcohol Use: Yes     Comment: occassional  . Drug Use: No  . Sexual Activity: Yes    Birth Control/ Protection: Injection     Comment: due for depo shot in June but desires a diff type of birth control   Other Topics  Concern  . Not on file   Social History Narrative   No current facility-administered medications on file prior to encounter.   Current Outpatient Prescriptions on File Prior to Encounter  Medication Sig Dispense Refill  . norgestimate-ethinyl estradiol (ORTHO-CYCLEN,SPRINTEC,PREVIFEM) 0.25-35 MG-MCG tablet Take 1 tablet by mouth daily. 1 Package 11  . ciprofloxacin (CIPRO) 250 MG tablet Take 1 tablet (250 mg total) by mouth every 12 (twelve) hours. (Patient not taking: Reported on 09/01/2015) 10 tablet 0  .  famotidine (PEPCID) 20 MG tablet Take 1 tablet (20 mg total) by mouth 2 (two) times daily. (Patient not taking: Reported on 09/01/2015) 30 tablet 0  . ibuprofen (ADVIL,MOTRIN) 800 MG tablet Take 800 mg by mouth every 4 (four) hours.     Allergies  Allergen Reactions  . Sulfa Antibiotics Rash    I have reviewed the past Medical Hx, Surgical Hx, Social Hx, Allergies and Medications.   Review of Systems  Constitutional: Positive for fatigue. Negative for fever, chills and appetite change.  Gastrointestinal: Positive for abdominal pain and constipation. Negative for nausea, vomiting, diarrhea, blood in stool and abdominal distention.  Genitourinary: Positive for urgency, frequency, vaginal bleeding, menstrual problem, pelvic pain and dyspareunia. Negative for dysuria, flank pain, vaginal discharge, difficulty urinating and vaginal pain.  Musculoskeletal: Positive for back pain. Negative for myalgias.  Skin: Positive for pallor.  Neurological: Positive for dizziness.  Hematological: Does not bruise/bleed easily.    OBJECTIVE Patient Vitals for the past 24 hrs:  BP Temp Temp src Pulse Resp Height Weight  09/01/15 1408 118/65 mmHg - - 89 18 - -  09/01/15 1048 123/74 mmHg 98.3 F (36.8 C) Oral 94 16 - -  09/01/15 1046 - - - - - 5\' 4"  (1.626 m) 167 lb 1 oz (75.778 kg)   Constitutional: Well-developed, well-nourished female in no acute distress.  Skin: Mild pallor Cardiovascular: normal rate Respiratory: normal rate and effort.  GI: Abd distended, mild suprapubic tenderness. Negative for mass, guarding or rebound tenderness. Pos BS x 4 MS: Extremities nontender, no edema, normal ROM Neurologic: Alert and oriented x 4.  GU: Neg CVAT.  SPECULUM EXAM: NEFG, small amount of dark red blood noted, cervix clean  BIMANUAL: cervix closed; uterus normal size, no adnexal tenderness or masses. No CMT.  LAB RESULTS Results for orders placed or performed during the hospital encounter of 09/01/15 (from  the past 24 hour(s))  Urinalysis, Routine w reflex microscopic (not at [facility])     Status: Abnormal   Collection Time: 09/01/15 11:10 AM  Result Value Ref Range   Color, Urine YELLOW YELLOW   APPearance CLEAR CLEAR   Specific Gravity, Urine >1.030 (H) 1.005 - 1.030   pH 5.5 5.0 - 8.0   Glucose, UA NEGATIVE NEGATIVE mg/dL   Hgb urine dipstick NEGATIVE NEGATIVE   Bilirubin Urine NEGATIVE NEGATIVE   Ketones, ur NEGATIVE NEGATIVE mg/dL   Protein, ur NEGATIVE NEGATIVE mg/dL   Nitrite NEGATIVE NEGATIVE   Leukocytes, UA NEGATIVE NEGATIVE  Pregnancy, urine POC     Status: None   Collection Time: 09/01/15 11:16 AM  Result Value Ref Range   Preg Test, Ur NEGATIVE NEGATIVE  CBC     Status: Abnormal   Collection Time: 09/01/15 11:42 AM  Result Value Ref Range   WBC 4.9 4.0 - 10.5 K/uL   RBC 3.75 (L) 3.87 - 5.11 MIL/uL   Hemoglobin 8.3 (L) 12.0 - 15.0 g/dL   HCT 16.1 (L) 09.6 - 04.5 %   MCV 73.1 (  L) 78.0 - 100.0 fL   MCH 22.1 (L) 26.0 - 34.0 pg   MCHC 30.3 30.0 - 36.0 g/dL   RDW 16.1 (H) 09.6 - 04.5 %   Platelets 236 150 - 400 K/uL    IMAGING No results found.  MAU COURSE CBC, UPT, UA.  Megace 80 mg and pyridium given.   MDM - 30 year old non-pregnant female with prolonged menstrual bleeding lasting 5 months and dropping hemoglobin. Mildly symptomatic anemia, bleeding stable today.  - Generalized abdominal pain and tenderness likely due to chronic constipation. Will start patient on MiraLAX and insisted that she follow-up as scheduled with GI. - Urinary urgency and frequency with normal UA. Will send urine for culture and treat patient with Pyridium. Will need to follow-up with primary care provider if culture negative and symptoms persist.  ASSESSMENT 1. Abnormal uterine bleeding   2. Urinary frequency   3. Constipation, chronic   4. Anemia due to chronic blood loss     PLAN Discharge home in stable condition. Bleeding Precautions Stop OCPs and start Megace 80 BID until  bleeding stops then decrease to 80 QD until F/U appt at [facility].  Miralax In-basket message sent to [facility] suggesting pelvic ultrasound. Patient may want to discuss ablation with her providers there. Handout given.   Medication List    STOP taking these medications        ciprofloxacin 250 MG tablet  Commonly known as:  CIPRO     ibuprofen 800 MG tablet  Commonly known as:  ADVIL,MOTRIN     norgestimate-ethinyl estradiol 0.25-35 MG-MCG tablet  Commonly known as:  ORTHO-CYCLEN,SPRINTEC,PREVIFEM      TAKE these medications        acetaminophen 500 MG tablet  Commonly known as:  TYLENOL  Take 2 tablets (1,000 mg total) by mouth every 6 (six) hours as needed for mild pain or moderate pain.     famotidine 20 MG tablet  Commonly known as:  PEPCID  Take 1 tablet (20 mg total) by mouth 2 (two) times daily.     ferrous sulfate 325 (65 FE) MG tablet  Take 1 tablet (325 mg total) by mouth 2 (two) times daily with a meal.     megestrol 20 MG tablet  Commonly known as:  MEGACE  Take 4 tablets (80 mg total) by mouth 2 (two) times daily. Can decrease to 80 mg (4 tablets) per day when bleeding stops.     phenazopyridine 200 MG tablet  Commonly known as:  PYRIDIUM  Take 1 tablet (200 mg total) by mouth 3 (three) times daily as needed for pain.     polyethylene glycol powder powder  Commonly known as:  GLYCOLAX/MIRALAX  Take 17 g by mouth 2 (two) times daily. Until soft, daily bowel movements. Then take once daily as needed.         [place], [place] 09/01/2015  1:37 PM

## 2015-09-02 LAB — URINE CULTURE
Culture: NO GROWTH
SPECIAL REQUESTS: NORMAL

## 2015-09-03 LAB — GC/CHLAMYDIA PROBE AMP (~~LOC~~) NOT AT ARMC
Chlamydia: NEGATIVE
Neisseria Gonorrhea: NEGATIVE

## 2015-10-03 ENCOUNTER — Ambulatory Visit: Payer: Medicaid Other | Admitting: Nurse Practitioner

## 2015-10-25 ENCOUNTER — Encounter: Payer: Self-pay | Admitting: Nurse Practitioner

## 2015-10-25 ENCOUNTER — Ambulatory Visit (INDEPENDENT_AMBULATORY_CARE_PROVIDER_SITE_OTHER): Payer: Self-pay | Admitting: Nurse Practitioner

## 2015-10-25 DIAGNOSIS — K649 Unspecified hemorrhoids: Secondary | ICD-10-CM

## 2015-10-25 DIAGNOSIS — K589 Irritable bowel syndrome without diarrhea: Secondary | ICD-10-CM

## 2015-10-25 NOTE — Progress Notes (Signed)
cc'ed to pcp °

## 2015-10-25 NOTE — Progress Notes (Signed)
Primary Care Physician:  Tylene FantasiaMUSE,ROCHELLE D., PA-C Primary Gastroenterologist:  Dr. Jena Gaussourk  Chief Complaint  Patient presents with  . Irritable Bowel Syndrome    abd tenderness  . Constipation    bm approx 1x/wk    HPI:   [name] is a 30 y.o. female who presents On referral from primary care/OB/GYN for history of irritable bowel syndrome and associated constipation. Today she states she's been told she has a history of IBS. Stays constipated since high school years. Has a bowel movement about once a week which is small in amount, hard stools, incomplete emptying, and straining. Has associated abdominal tenderness mid to lower abdomen which improves after bowel movement. Will be more sore if she sleeps on her stomach. Intermittent bloating which improves with bowel movement. Has tried Miralax without much improvement. Has also tried stool softeners and enemas without improvement. Occasional toilet tissue hematochezia with known hemorrhoids. Occasional hemorrhoid burning. Denies melena, sudden changes in bowel habits, fever, chills, unintentional weight loss. Denies chest pain, dyspnea, dizziness, lightheadedness, syncope, near syncope. Denies any other upper or lower GI symptoms.  Past Medical History:  Diagnosis Date  . BV (bacterial vaginosis) 01/18/2014  . Contraceptive management 12/06/2013  . Shoulder dislocation, recurrent   . Vaginal discharge 01/18/2014    Past Surgical History:  Procedure Laterality Date  . WISDOM TOOTH EXTRACTION      Current Outpatient Prescriptions  Medication Sig Dispense Refill  . acetaminophen (TYLENOL) 500 MG tablet Take 2 tablets (1,000 mg total) by mouth every 6 (six) hours as needed for mild pain or moderate pain. 30 tablet 0  . megestrol (MEGACE) 20 MG tablet Take 4 tablets (80 mg total) by mouth 2 (two) times daily. Can decrease to 80 mg (4 tablets) per day when bleeding stops. (Patient taking differently: Take 80 mg by mouth daily. Can  decrease to 80 mg (4 tablets) per day when bleeding stops.) 60 tablet 3  . polyethylene glycol powder (GLYCOLAX/MIRALAX) powder Take 17 g by mouth 2 (two) times daily. Until soft, daily bowel movements. Then take once daily as needed. 500 g 2  . famotidine (PEPCID) 20 MG tablet Take 1 tablet (20 mg total) by mouth 2 (two) times daily. (Patient not taking: Reported on 09/01/2015) 30 tablet 0  . ferrous sulfate 325 (65 FE) MG tablet Take 1 tablet (325 mg total) by mouth 2 (two) times daily with a meal. (Patient not taking: Reported on 10/25/2015) 30 tablet 0  . phenazopyridine (PYRIDIUM) 200 MG tablet Take 1 tablet (200 mg total) by mouth 3 (three) times daily as needed for pain. (Patient not taking: Reported on 10/25/2015) 30 tablet 1   No current facility-administered medications for this visit.     Allergies as of 10/25/2015 - Review Complete 10/25/2015  Allergen Reaction Noted  . Sulfa antibiotics Rash 09/30/2010    Family History  Problem Relation Age of Onset  . Diabetes Sister   . Cancer Paternal Grandmother     breast  . Asthma Son     Social History   Social History  . Marital status: Married    Spouse name: N/A  . Number of children: N/A  . Years of education: N/A   Occupational History  . Not on file.   Social History Main Topics  . Smoking status: Former Smoker    Types: Cigarettes    Quit date: 07/22/2004  . Smokeless tobacco: Never Used  . Alcohol use Yes     Comment: occassional  .  Drug use: No  . Sexual activity: Yes    Birth control/ protection: Injection     Comment: due for depo shot in June but desires a diff type of birth control   Other Topics Concern  . Not on file   Social History Narrative  . No narrative on file    Review of Systems: General: Negative for anorexia, weight loss, fever, chills, fatigue, weakness. ENT: Negative for hoarseness, difficulty swallowing. CV: Negative for chest pain, angina, palpitations, peripheral edema.    Respiratory: Negative for dyspnea at rest, cough, sputum, wheezing.  GI: See history of present illness. GU: Admits history of heavy vaginal bleeding. Derm: Negative for rash or itching.  Endo: Negative for unusual weight change.  Heme: Negative for bruising or bleeding. Allergy: Negative for rash or hives.    Physical Exam: BP 120/69   Pulse 84   Temp 98.1 F (36.7 C) (Oral)   Ht 5\' 4"  (1.626 m)   Wt 165 lb (74.8 kg)   LMP 10/15/2015 (Approximate) Comment: takes Megace  BMI 28.32 kg/m  General:   Alert and oriented. Pleasant and cooperative. Well-nourished and well-developed.  Head:  Normocephalic and atraumatic. Eyes:  Without icterus, sclera clear and conjunctiva pink.  Ears:  Normal auditory acuity. Cardiovascular:  S1, S2 present without murmurs appreciated. Extremities without clubbing or edema. Respiratory:  Clear to auscultation bilaterally. No wheezes, rales, or rhonchi. No distress.  Gastrointestinal:  +BS, soft, and non-distended. Mild generalized TTP. No HSM noted. No guarding or rebound. No masses appreciated.  Rectal:  Deferred  Musculoskalatal:  Symmetrical without gross deformities. Neurologic:  Alert and oriented x4;  grossly normal neurologically. Psych:  Alert and cooperative. Normal mood and affect. Heme/Lymph/Immune: o excessive bruising noted.    10/25/2015 8:55 AM   Disclaimer: This note was dictated with voice recognition software. Similar sounding words can inadvertently be transcribed and may not be corrected upon review.

## 2015-10-25 NOTE — Patient Instructions (Signed)
1. I will provide you with samples of Linzess 145 g daily. 2. Take this once a day on an empty stomach.  3. Call with progress report in one and a half to 2 weeks. 4. Return for follow-up in 3 months.

## 2015-10-25 NOTE — Assessment & Plan Note (Signed)
Patient with significant constipation associated with abdominal pain and pain relief after defecation which fits the description of her previous diagnosis with irritable bowel syndrome, currently untreated. She is tried multiple over-the-counter remedies without relief. At this point we will start her on Linzess 145 g daily. We will request she call with a progress report in 2 weeks and return for follow-up in 3 months. Based on her progress report we can dose adjust or change agents. Only other GI symptoms include occasional scant toilet tissue hematochezia in the setting of known hemorrhoids after significant straining. Once her bowels are better controlled and she continues to have hemorrhoid issues we can treat with topical therapy and consider future excision versus banding depending on her response.

## 2015-10-25 NOTE — Assessment & Plan Note (Signed)
She with known history of hemorrhoids which have become more bothersome given IBS and significant constipation with one bowel movement a week, hard stools, significant straining. Linzess as noted below. She continues to have hemorrhoid symptoms despite better control of her bowels on her follow-up visit we can prescribe topical therapy and monitor for need for excision versus hemorrhoid banding. Return for follow-up in 3 months.

## 2015-12-03 ENCOUNTER — Other Ambulatory Visit: Payer: Self-pay | Admitting: Advanced Practice Midwife

## 2015-12-03 NOTE — Telephone Encounter (Signed)
[facility] for further refills and management of this problem.

## 2016-01-24 ENCOUNTER — Ambulatory Visit: Payer: Self-pay | Admitting: Nurse Practitioner

## 2016-02-19 ENCOUNTER — Emergency Department (HOSPITAL_COMMUNITY): Payer: Self-pay

## 2016-02-19 ENCOUNTER — Emergency Department (HOSPITAL_COMMUNITY)
Admission: EM | Admit: 2016-02-19 | Discharge: 2016-02-20 | Disposition: A | Discharge status: Home / Self Care | Payer: Self-pay | Attending: Emergency Medicine | Admitting: Emergency Medicine

## 2016-02-19 ENCOUNTER — Encounter (HOSPITAL_COMMUNITY): Payer: Self-pay

## 2016-02-19 DIAGNOSIS — Z87891 Personal history of nicotine dependence: Secondary | ICD-10-CM | POA: Insufficient documentation

## 2016-02-19 DIAGNOSIS — D649 Anemia, unspecified: Secondary | ICD-10-CM | POA: Insufficient documentation

## 2016-02-19 LAB — BASIC METABOLIC PANEL
Anion gap: 8 (ref 5–15)
BUN: 9 mg/dL (ref 6–20)
CALCIUM: 8.8 mg/dL — AB (ref 8.9–10.3)
CO2: 23 mmol/L (ref 22–32)
CREATININE: 0.83 mg/dL (ref 0.44–1.00)
Chloride: 109 mmol/L (ref 101–111)
GFR calc Af Amer: >60 mL/min (ref >60)
GFR calc non Af Amer: >60 mL/min (ref >60)
GLUCOSE: 125 mg/dL — AB (ref 65–99)
Potassium: 3.3 mmol/L — ABNORMAL LOW (ref 3.5–5.1)
Sodium: 140 mmol/L (ref 135–145)

## 2016-02-19 LAB — CBC
HCT: 25.4 % — ABNORMAL LOW (ref 36.0–46.0)
HEMOGLOBIN: 7 g/dL — AB (ref 12.0–15.0)
MCH: 17.6 pg — AB (ref 26.0–34.0)
MCHC: 27.6 g/dL — AB (ref 30.0–36.0)
MCV: 64 fL — ABNORMAL LOW (ref 78.0–100.0)
PLATELETS: 213 10*3/uL (ref 150–400)
RBC: 3.97 MIL/uL (ref 3.87–5.11)
RDW: 18.7 % — AB (ref 11.5–15.5)
WBC: 4.7 10*3/uL (ref 4.0–10.5)

## 2016-02-19 LAB — I-STAT TROPONIN, ED: TROPONIN I, POC: 0 ng/mL (ref 0.00–0.08)

## 2016-02-19 NOTE — ED Provider Notes (Signed)
[facility]-EMERGENCY DEPT Provider Note   CSN: [id number] Arrival date & time: 02/19/16  1746  History   Chief Complaint Chief Complaint  Patient presents with  . Chest Pain  . Shortness of Breath    HPI [name] is a 30 y.o. female.  HPI   Pt has PMH of significant menstrual bleeding requiring Megace, IBS comes to the ER of evaluation of a month of shortness of breath, front and back rib pain, and chest pressure. It has been persisting for 1 month. She came in tonight because all of the OTC flu and cold medication has not been helping and she wanted to get it looked at. She is not in any distress but reports with large inhalation she has pain. No le swelling. She does take Megace. No fevers, n/v/d. No weakness or confusion.  Past Medical History:  Diagnosis Date  . BV (bacterial vaginosis) 01/18/2014  . Contraceptive management 12/06/2013  . Shoulder dislocation, recurrent   . Vaginal discharge 01/18/2014    Patient Active Problem List   Diagnosis Date Noted  . IBS (irritable bowel syndrome) 10/25/2015  . Hemorrhoids 10/25/2015  . Vaginal discharge 01/18/2014  . BV (bacterial vaginosis) 01/18/2014  . Contraceptive management 12/06/2013  . Dislocated shoulder 03/31/2013    Past Surgical History:  Procedure Laterality Date  . WISDOM TOOTH EXTRACTION      OB History    Gravida Para Term Preterm AB Living   3 3 3     3    SAB TAB Ectopic Multiple Live Births           3       Home Medications    Prior to Admission medications   Medication Sig Start Date End Date Taking? Authorizing Provider  acetaminophen (TYLENOL) 500 MG tablet Take 2 tablets (1,000 mg total) by mouth every 6 (six) hours as needed for mild pain or moderate pain. 09/01/15  Yes Dorathy KinsmanVirginia Smith, CNM  megestrol (MEGACE) 20 MG tablet TAKE 4 TABLETS BY MOUTH 2 TIMES DAILY, can decrease TO 4 tablets ONCE daily (80mg ) when bleeding stops Patient taking differently: TAKES 4 TABS BY MOUTH [facility]  BLEEDING 12/03/15  Yes Dorathy KinsmanVirginia Smith, CNM    Family History Family History  Problem Relation Age of Onset  . Diabetes Sister   . Cancer Paternal Grandmother     breast  . Asthma Son   . Colon cancer Neg Hx     Social History Social History  Substance Use Topics  . Smoking status: Former Smoker    Types: Cigarettes    Quit date: 07/22/2004  . Smokeless tobacco: Never Used  . Alcohol use Yes     Comment: occassional     Allergies   Sulfa antibiotics   Review of Systems Review of Systems Review of Systems All other systems negative except as documented in the HPI. All pertinent positives and negatives as reviewed in the HPI.   Physical Exam Updated Vital Signs BP 119/85   Pulse 76   Temp 98.4 F (36.9 C) (Oral)   Resp 17   LMP 02/12/2016 (Exact Date)   SpO2 100%   Physical Exam  Constitutional: She appears well-developed and well-nourished.  HENT:  Head: Normocephalic and atraumatic.  Eyes: Conjunctivae are normal. Pupils are equal, round, and reactive to light.  Neck: Trachea normal, normal range of motion and full passive range of motion without pain. Neck supple.  Cardiovascular: Normal rate, regular rhythm and normal pulses.   Pulmonary/Chest: Effort  normal and breath sounds normal. Chest wall is not dull to percussion. She exhibits no tenderness, no crepitus, no edema, no deformity and no retraction.  Abdominal: Soft. Normal appearance and bowel sounds are normal.  Musculoskeletal: Normal range of motion.  Neurological: She is alert. She has normal strength.  Skin: Skin is warm, dry and intact.  Psychiatric: She has a normal mood and affect. Her speech is normal and behavior is normal. Judgment and thought content normal. Cognition and memory are normal.     ED Treatments / Results  Labs (all labs ordered are listed, but only abnormal results are displayed) Labs Reviewed  BASIC METABOLIC PANEL - Abnormal; Notable for the following:       Result Value     Potassium 3.3 (*)    Glucose, Bld 125 (*)    Calcium 8.8 (*)    All other components within normal limits  CBC - Abnormal; Notable for the following:    Hemoglobin 7.0 (*)    HCT 25.4 (*)    MCV 64.0 (*)    MCH 17.6 (*)    MCHC 27.6 (*)    RDW 18.7 (*)    All other components within normal limits  D-DIMER, QUANTITATIVE (NOT AT [facility]) - Abnormal; Notable for the following:    D-Dimer, [facility] 2.18 (*)    All other components within normal limits  HEMOGLOBIN AND HEMATOCRIT, BLOOD - Abnormal; Notable for the following:    Hemoglobin 7.2 (*)    HCT 25.7 (*)    All other components within normal limits  HEPATIC FUNCTION PANEL - Abnormal; Notable for the following:    ALT 12 (*)    Bilirubin, Direct <0.1 (*)    All other components within normal limits  I-STAT TROPOININ, ED  TYPE AND SCREEN  ABO/RH  PREPARE RBC (CROSSMATCH)    EKG  EKG Interpretation None       Radiology Dg Chest 2 View  Result Date: 02/19/2016 CLINICAL DATA:  30 year old female with central chest pain and shortness of breath for the past 3 weeks. EXAM: CHEST  2 VIEW COMPARISON:  Chest x-ray 05/26/2015. FINDINGS: Lung volumes are normal. No consolidative airspace disease. No pleural effusions. No pneumothorax. No pulmonary nodule or mass noted. Pulmonary vasculature and the cardiomediastinal silhouette are within normal limits. IMPRESSION: No radiographic evidence of acute cardiopulmonary disease. Electronically Signed   By: Trudie Reedaniel  Entrikin M.D.   On: 02/19/2016 18:37    Procedures Procedures (including critical care time)  Medications Ordered in ED Medications  iopamidol (ISOVUE-370) 76 % injection (100 mLs  Contrast Given 02/20/16 0153)  0.9 %  sodium chloride infusion ( Intravenous New Bag/Given 02/20/16 0340)     Initial Impression / Assessment and Plan / ED Course  I have reviewed the triage vital signs and the nursing notes.  Pertinent labs & imaging results that were available during my care  of the patient were reviewed by me and considered in my medical decision making (see chart for details).  Clinical Course     1:20am Hemoglobin is 7.2, pt is no longer bleeding but does have heavy menstrual cycles her d-dimer is elevated, will obtain CT angio PE chest. If Ct angio is negative and no cause for SOB is found may consider transfusing 1 unit of blood for symptomatic anemia.  2:41 am The patients CT angio is negative for PE. Discussed case with Dr. Verdie MosherLiu, will transfuse for symptomatic anemia. [facility], she wants to discuss possibly  having a hysterectomy with them due to ongoing bleeding, clotting and pain.  CRITICAL CARE Performed by: Dorthula Matas Total critical care time: 60 minutes Critical care time was exclusive of separately billable procedures and treating other patients. Critical care was necessary to treat or prevent imminent or life-threatening deterioration. Critical care was time spent personally by me on the following activities: development of treatment plan with patient and/or surrogate as well as nursing, discussions with consultants, evaluation of patient's response to treatment, examination of patient, obtaining history from patient or surrogate, ordering and performing treatments and interventions, ordering and review of laboratory studies, ordering and review of radiographic studies, pulse oximetry and re-evaluation of patient's condition.  Blood pressure 119/85, pulse 76, temperature 98.4 F (36.9 C), temperature source Oral, resp. rate 17, last menstrual period 02/12/2016, SpO2 100 %.   Patient to follow-up with Ob/gyn for hemoglobin recheck and to discuss further ways to eliminate excessive menstrual bleeding.  I discussed results, diagnoses and plan with Tracey Ade. They voice there understanding and questions were answered. We discussed follow-up recommendations and return precautions.   Final Clinical Impressions(s)  / ED Diagnoses   Final diagnoses:  Symptomatic anemia    New Prescriptions New Prescriptions   No medications on file     Marlon Pel, PA-C 02/20/16 1610    Lavera Guise, MD 02/20/16 [phone]

## 2016-02-19 NOTE — ED Triage Notes (Signed)
Pt presents for evaluation of central chest pressure with radiation to bilateral back/ribcage x1 month. Pt. States SOB and pain worsening with deep breathing and exertion. Pt endorses congestion but denies cough.

## 2016-02-20 ENCOUNTER — Emergency Department (HOSPITAL_COMMUNITY): Payer: Self-pay

## 2016-02-20 LAB — HEMOGLOBIN AND HEMATOCRIT, BLOOD
HEMATOCRIT: 25.7 % — AB (ref 36.0–46.0)
HEMOGLOBIN: 7.2 g/dL — AB (ref 12.0–15.0)

## 2016-02-20 LAB — D-DIMER, QUANTITATIVE: D-Dimer, Quant: 2.18 ug/mL-FEU — ABNORMAL HIGH (ref 0.00–0.50)

## 2016-02-20 LAB — HEPATIC FUNCTION PANEL
ALBUMIN: 4.1 g/dL (ref 3.5–5.0)
ALK PHOS: 51 U/L (ref 38–126)
ALT: 12 U/L — ABNORMAL LOW (ref 14–54)
AST: 20 U/L (ref 15–41)
BILIRUBIN TOTAL: 0.4 mg/dL (ref 0.3–1.2)
Total Protein: 7.7 g/dL (ref 6.5–8.1)

## 2016-02-20 LAB — ABO/RH: ABO/RH(D): O POS

## 2016-02-20 LAB — PREPARE RBC (CROSSMATCH)

## 2016-02-20 MED ORDER — SODIUM CHLORIDE 0.9 % IV SOLN
Freq: Once | INTRAVENOUS | Status: AC
  Administered 2016-02-20: 04:00:00 via INTRAVENOUS

## 2016-02-20 MED ORDER — IOPAMIDOL (ISOVUE-370) INJECTION 76%
INTRAVENOUS | Status: AC
  Administered 2016-02-20: 100 mL
  Filled 2016-02-20: qty 100

## 2016-02-20 NOTE — ED Notes (Signed)
Consent obtained. Pt understands risks and benefits of receiving blood transfusion.

## 2016-02-21 LAB — TYPE AND SCREEN
Blood Product Expiration Date: 201801022359
ISSUE DATE / TIME: 201712270319
UNIT TYPE AND RH: 5100

## 2016-02-25 NOTE — L&D Delivery Note (Signed)
Delivery Note At  a viable girl baby was delivered via  (Presentation: OA ;  ).  APGAR: pending , ; weight  .   Placenta status: , .  Cord:  with the following complications: .  Cord pH: NA  Anesthesia:  none Episiotomy:  none Lacerations:  Hemostatic first degree Suture Repair: NA Est. Blood Loss (mL):  250 cc  Mom to postpartum.  Baby to Couplet care / Skin to Skin.  Thressa ShellerHeather Rogenia Werntz 02/21/2017, 6:54 AM  Patient arrived via EMS with urge to push. Complete and crowning.  Thressa ShellerHeather Forrest Demuro 7:03 AM 02/21/17

## 2016-06-20 ENCOUNTER — Emergency Department (HOSPITAL_COMMUNITY)
Admission: EM | Admit: 2016-06-20 | Discharge: 2016-06-20 | Disposition: A | Discharge status: Home / Self Care | Payer: Self-pay | Attending: Emergency Medicine | Admitting: Emergency Medicine

## 2016-06-20 ENCOUNTER — Encounter (HOSPITAL_COMMUNITY): Payer: Self-pay | Admitting: Emergency Medicine

## 2016-06-20 DIAGNOSIS — O9989 Other specified diseases and conditions complicating pregnancy, childbirth and the puerperium: Secondary | ICD-10-CM | POA: Insufficient documentation

## 2016-06-20 DIAGNOSIS — Z3490 Encounter for supervision of normal pregnancy, unspecified, unspecified trimester: Secondary | ICD-10-CM

## 2016-06-20 DIAGNOSIS — R1030 Lower abdominal pain, unspecified: Secondary | ICD-10-CM | POA: Insufficient documentation

## 2016-06-20 DIAGNOSIS — Z87891 Personal history of nicotine dependence: Secondary | ICD-10-CM | POA: Insufficient documentation

## 2016-06-20 DIAGNOSIS — O26899 Other specified pregnancy related conditions, unspecified trimester: Secondary | ICD-10-CM | POA: Insufficient documentation

## 2016-06-20 DIAGNOSIS — N644 Mastodynia: Secondary | ICD-10-CM | POA: Insufficient documentation

## 2016-06-20 DIAGNOSIS — Z3A00 Weeks of gestation of pregnancy not specified: Secondary | ICD-10-CM | POA: Insufficient documentation

## 2016-06-20 DIAGNOSIS — Z7982 Long term (current) use of aspirin: Secondary | ICD-10-CM | POA: Insufficient documentation

## 2016-06-20 LAB — URINALYSIS, ROUTINE W REFLEX MICROSCOPIC
BILIRUBIN URINE: NEGATIVE
GLUCOSE, UA: NEGATIVE mg/dL
KETONES UR: 20 mg/dL — AB
NITRITE: NEGATIVE
PROTEIN: NEGATIVE mg/dL
Specific Gravity, Urine: 1.03 (ref 1.005–1.030)
pH: 5 (ref 5.0–8.0)

## 2016-06-20 LAB — POC URINE PREG, ED
Preg Test, Ur: POSITIVE — AB
Preg Test, Ur: POSITIVE — AB

## 2016-06-20 MED ORDER — ONDANSETRON 4 MG PO TBDP: 4.0000 mg | ORAL_TABLET | Freq: Three times a day (TID) | ORAL | 0 refills | Status: DC | PRN

## 2016-06-20 MED ORDER — PRENATAL COMPLETE 14-0.4 MG PO TABS: 1.0000 | ORAL_TABLET | Freq: Every day | ORAL | 0 refills | Status: DC

## 2016-06-20 NOTE — ED Provider Notes (Signed)
[facility]-EMERGENCY DEPT Provider Note   CSN: [id number] Arrival date & time: 06/20/16  1843     History   Chief Complaint Chief Complaint  Patient presents with  . Multiple Complaints    HPI [name] is a 31 y.o. female.  Patient presents with complaint of periodic hot flashes, breast tenderness and late menses. She did not take a pregnancy test prior to arrival. Symptoms x 1 week. Yesterday she reports an episode of lightheadedness and nausea. No vomiting. No vaginal discharge, bleeding. No dysuria. She does say that she feels like she has to urinate but then only produces a small amount. No fever.   The history is provided by the patient. No language interpreter was used.    Past Medical History:  Diagnosis Date  . BV (bacterial vaginosis) 01/18/2014  . Contraceptive management 12/06/2013  . Shoulder dislocation, recurrent   . Vaginal discharge 01/18/2014    Patient Active Problem List   Diagnosis Date Noted  . IBS (irritable bowel syndrome) 10/25/2015  . Hemorrhoids 10/25/2015  . Vaginal discharge 01/18/2014  . BV (bacterial vaginosis) 01/18/2014  . Contraceptive management 12/06/2013  . Dislocated shoulder 03/31/2013    Past Surgical History:  Procedure Laterality Date  . WISDOM TOOTH EXTRACTION      OB History    Gravida Para Term Preterm AB Living   SAB TAB Ectopic Multiple Live Births           3       Home Medications    Prior to Admission medications   Medication Sig Start Date End Date Taking? Authorizing Provider  aspirin 325 MG tablet Take 650-975 mg by mouth every 6 (six) hours as needed for mild pain, moderate pain or headache.   Yes Historical Provider, MD    Family History Family History  Problem Relation Age of Onset  . Diabetes Sister   . Cancer Paternal Grandmother     breast  . Asthma Son   . Colon cancer Neg Hx     Social History Social History  Substance Use Topics  . Smoking status: Former Smoker   Types: Cigarettes    Quit date: 07/22/2004  . Smokeless tobacco: Never Used  . Alcohol use Yes     Comment: occassional     Allergies   Sulfa antibiotics   Review of Systems Review of Systems  Respiratory: Negative.  Negative for cough.   Cardiovascular: Negative.  Negative for chest pain.  Gastrointestinal: Positive for nausea. Negative for abdominal pain and vomiting.  Genitourinary: Positive for difficulty urinating and menstrual problem. Negative for pelvic pain, vaginal bleeding and vaginal discharge.       C/o breast tenderness  Musculoskeletal: Negative.   Skin: Negative.   Neurological: Positive for light-headedness. Negative for syncope.     Physical Exam Updated Vital Signs BP 126/72 (BP Location: Left Arm)   Pulse 97   Temp 98.4 F (36.9 C) (Oral)   Resp 16   LMP 05/14/2016   SpO2 100%   Physical Exam  Constitutional: She is oriented to person, place, and time. She appears well-developed and well-nourished.  HENT:  Head: Normocephalic.  Neck: Normal range of motion. Neck supple.  Cardiovascular: Normal rate and regular rhythm.  Exam reveals no friction rub.   No murmur heard. Pulmonary/Chest: Effort normal and breath sounds normal. She has no wheezes. She has no rales.  Abdominal: Soft. Bowel sounds are normal. There is tenderness (  Mild tenderness across lower abdomen. No guarding, distention or rebound.). There is no rebound and no guarding.  Musculoskeletal: Normal range of motion.  Neurological: She is alert and oriented to person, place, and time.  Skin: Skin is warm and dry. No rash noted.  Psychiatric: She has a normal mood and affect.     ED Treatments / Results  Labs (all labs ordered are listed, but only abnormal results are displayed) Labs Reviewed  URINALYSIS, ROUTINE W REFLEX MICROSCOPIC - Abnormal; Notable for the following:       Result Value   APPearance HAZY (*)    Hgb urine dipstick MODERATE (*)    Ketones, ur 20 (*)     Leukocytes, UA SMALL (*)    Bacteria, UA RARE (*)    Squamous Epithelial / LPF 6-30 (*)    All other components within normal limits  POC URINE PREG, ED - Abnormal; Notable for the following:    Preg Test, Ur POSITIVE (*)    All other components within normal limits  POC URINE PREG, ED - Abnormal; Notable for the following:    Preg Test, Ur POSITIVE (*)    All other components within normal limits    EKG  EKG Interpretation None       Radiology No results found.  Procedures Procedures (including critical care time)  Medications Ordered in ED Medications - No data to display   Initial Impression / Assessment and Plan / ED Course  I have reviewed the triage vital signs and the nursing notes.  Pertinent labs & imaging results that were available during my care of the patient were reviewed by me and considered in my medical decision making (see chart for details).     Patient presents with signs and symptoms of pregnancy. Urine preg test positive. No concerning symptoms of bleeding, discharge or evidence/concern of infection. Will start prenatal vitamins and refer to [facility] for prenatal care.   Final Clinical Impressions(s) / ED Diagnoses   Final diagnoses:  None   1. Pregnancy  New Prescriptions New Prescriptions   No medications on file     Elpidio Anis, Cordelia Poche 06/20/16 2021    Lavera Guise, MD 06/21/16 [phone]

## 2016-06-20 NOTE — Discharge Instructions (Signed)
Take the prenatal vitamins daily and Zofran if needed for nausea. Follow up for prenatal care with [facility]. Go to [facility] if there is any vaginal bleeding, significant abdominal pain or other pregnancy concern.

## 2016-06-20 NOTE — ED Triage Notes (Signed)
Pt presents with multiple complaints: frequency with urination for a month, dizziness and hot flashes "when doing to much" since this past weekend, possible pregnancy related to period due 4/19.

## 2016-06-26 ENCOUNTER — Encounter: Payer: Self-pay | Admitting: Obstetrics and Gynecology

## 2016-07-07 ENCOUNTER — Other Ambulatory Visit: Payer: Self-pay | Admitting: Obstetrics & Gynecology

## 2016-07-07 DIAGNOSIS — O3680X0 Pregnancy with inconclusive fetal viability, not applicable or unspecified: Secondary | ICD-10-CM

## 2016-07-08 ENCOUNTER — Ambulatory Visit (INDEPENDENT_AMBULATORY_CARE_PROVIDER_SITE_OTHER): Payer: Medicaid Other

## 2016-07-08 DIAGNOSIS — O3680X0 Pregnancy with inconclusive fetal viability, not applicable or unspecified: Secondary | ICD-10-CM | POA: Diagnosis not present

## 2016-07-08 NOTE — Progress Notes (Addendum)
US 8+2 wks,single IUP,FHR 167 bpm,normal ovaries bilat,subchorionic hemorrhage 4.2 x 2.9 x 1.8 cm,crl 18.16 mm,EDD 02/15/2017

## 2016-07-17 ENCOUNTER — Telehealth: Payer: Self-pay | Admitting: *Deleted

## 2016-07-17 MED ORDER — DOXYLAMINE-PYRIDOXINE ER 20-20 MG PO TBCR: 1.0000 | EXTENDED_RELEASE_TABLET | Freq: Every day | ORAL | 8 refills | Status: DC

## 2016-07-17 NOTE — Telephone Encounter (Signed)
Informed patient that [name] was sent to pharmacy.

## 2016-07-17 NOTE — Telephone Encounter (Signed)
Patient called with complaints of nausea. Not currently taking anything. Would like something if possible. If [facility] is prescribed [facility] [address] [place] [facility] if anything else. Please advise.

## 2016-07-22 ENCOUNTER — Telehealth: Payer: Self-pay | Admitting: *Deleted

## 2016-07-22 NOTE — Telephone Encounter (Signed)
Informed pharmacy that Samara DeistBonjesta was approved and to rerun claim.

## 2016-07-28 ENCOUNTER — Encounter: Payer: Self-pay | Admitting: Women's Health

## 2016-07-28 ENCOUNTER — Ambulatory Visit: Payer: Medicaid Other | Admitting: *Deleted

## 2016-07-28 ENCOUNTER — Ambulatory Visit (INDEPENDENT_AMBULATORY_CARE_PROVIDER_SITE_OTHER): Payer: Medicaid Other | Admitting: Women's Health

## 2016-07-28 VITALS — BP 126/76 | HR 90 | Wt 168.0 lb

## 2016-07-28 DIAGNOSIS — Z3682 Encounter for antenatal screening for nuchal translucency: Secondary | ICD-10-CM

## 2016-07-28 DIAGNOSIS — Z1389 Encounter for screening for other disorder: Secondary | ICD-10-CM

## 2016-07-28 DIAGNOSIS — Z3481 Encounter for supervision of other normal pregnancy, first trimester: Secondary | ICD-10-CM

## 2016-07-28 DIAGNOSIS — Z3490 Encounter for supervision of normal pregnancy, unspecified, unspecified trimester: Secondary | ICD-10-CM | POA: Insufficient documentation

## 2016-07-28 DIAGNOSIS — Z3A11 11 weeks gestation of pregnancy: Secondary | ICD-10-CM | POA: Diagnosis not present

## 2016-07-28 DIAGNOSIS — Z331 Pregnant state, incidental: Secondary | ICD-10-CM

## 2016-07-28 LAB — POCT URINALYSIS DIPSTICK
GLUCOSE UA: NEGATIVE
Leukocytes, UA: NEGATIVE
NITRITE UA: NEGATIVE
Protein, UA: NEGATIVE
RBC UA: NEGATIVE

## 2016-07-28 NOTE — Progress Notes (Signed)
  Subjective:  [name] is a 31 y.o. [id number] African American female at 3563w1d by 8wk u/s, being seen today for her first obstetrical visit.  Her obstetrical history is significant for term uncomplicated svb x 3.  Pregnancy history fully reviewed.  Patient reports constipation, n/v- [facility] initially had problems getting it covered, but now it is and pharmacy didn't let her know rx was ready, so hasn't picked it up. Denies vb, cramping, uti s/s, abnormal/malodorous vag d/c, or vulvovaginal itching/irritation.  BP 126/76   Pulse 90   Wt 168 lb (76.2 kg)   LMP 05/04/2016 (Approximate)   BMI 28.84 kg/m   HISTORY: OB History  Gravida Para Term Preterm AB Living  4 3 3     3   SAB TAB Ectopic Multiple Live Births          3    # Outcome Date GA Lbr Len/2nd Weight Sex Delivery Anes PTL Lv  4 Current           3 Term 12/02/08 4966w0d  7 lb 10 oz (3.459 kg) M Vag-Spont None N LIV  2 Term 08/18/06 6666w0d  7 lb 4 oz (3.289 kg) F Vag-Spont None Y LIV  1 Term 08/14/04 3866w0d  7 lb 9 oz (3.43 kg) M Vag-Spont EPI N LIV     Past Medical History:  Diagnosis Date  . BV (bacterial vaginosis) 01/18/2014  . Contraceptive management 12/06/2013  . Shoulder dislocation, recurrent   . Vaginal discharge 01/18/2014   Past Surgical History:  Procedure Laterality Date  . WISDOM TOOTH EXTRACTION     Family History  Problem Relation Age of Onset  . Diabetes Sister   . Cancer Paternal Grandmother        breast  . Asthma Son   . Diabetes Maternal Aunt   . Cancer Paternal Aunt        cervical, breast  . Colon cancer Neg Hx     Exam   System:     General: Well developed & nourished, no acute distress   Skin: Warm & dry, normal coloration and turgor, no rashes   Neurologic: Alert & oriented, normal mood   Cardiovascular: Regular rate & rhythm   Respiratory: Effort & rate normal, LCTAB, acyanotic   Abdomen: Soft, non tender   Extremities: normal strength, tone  Thin prep pap smear  neg  05/07/15 FHR: 158 via doppler   Assessment:   Pregnancy: [id number] Patient Active Problem List   Diagnosis Date Noted  . Supervision of normal pregnancy 07/28/2016  . IBS (irritable bowel syndrome) 10/25/2015  . Hemorrhoids 10/25/2015  . Vaginal discharge 01/18/2014  . BV (bacterial vaginosis) 01/18/2014  . Dislocated shoulder 03/31/2013    5663w1d [id number] New OB visit N/V of pregnancy Constipation  Plan:  Initial labs obtained Continue prenatal vitamins Problem list reviewed and updated Reviewed n/v relief measures and warning s/s to report Take Bonjesta as rx'd, if not helping let us know Gave printed info on constipation Reviewed recommended weight gain based on pre-gravid BMI Encouraged well-balanced diet Genetic Screening discussed Integrated Screen: requested Cystic fibrosis screening discussed requested Ultrasound discussed; fetal survey: requested Follow up in 2 weeks for 1st IT/NT and visit [facility] completed  Marge DuncansBooker, Lacie Landry Randall CNM, [facility] 07/28/2016 3:40 PM

## 2016-07-28 NOTE — Patient Instructions (Signed)

## 2016-07-29 LAB — PMP SCREEN PROFILE (10S), URINE
Amphetamine Scrn, Ur: NEGATIVE ng/mL
BARBITURATE SCREEN URINE: NEGATIVE ng/mL
BENZODIAZEPINE SCREEN, URINE: NEGATIVE ng/mL
CANNABINOIDS UR QL SCN: NEGATIVE ng/mL
CREATININE(CRT), U: 134.7 mg/dL (ref 20.0–300.0)
Cocaine (Metab) Scrn, Ur: NEGATIVE ng/mL
METHADONE SCREEN, URINE: NEGATIVE ng/mL
OPIATE SCREEN URINE: NEGATIVE ng/mL
OXYCODONE+OXYMORPHONE UR QL SCN: NEGATIVE ng/mL
Ph of Urine: 7 (ref 4.5–8.9)
Phencyclidine Qn, Ur: NEGATIVE ng/mL
Propoxyphene Scrn, Ur: NEGATIVE ng/mL

## 2016-07-30 LAB — GC/CHLAMYDIA PROBE AMP
CHLAMYDIA, DNA PROBE: NEGATIVE
NEISSERIA GONORRHOEAE BY PCR: NEGATIVE

## 2016-07-30 LAB — URINE CULTURE

## 2016-07-31 ENCOUNTER — Other Ambulatory Visit: Payer: Self-pay | Admitting: Women's Health

## 2016-07-31 LAB — VARICELLA ZOSTER ANTIBODY, IGG: VARICELLA: 385 {index} (ref >165)

## 2016-07-31 LAB — URINALYSIS, ROUTINE W REFLEX MICROSCOPIC
Bilirubin, UA: NEGATIVE
GLUCOSE, UA: NEGATIVE
NITRITE UA: NEGATIVE
PROTEIN UA: NEGATIVE
RBC, UA: NEGATIVE
SPEC GRAV UA: 1.018 (ref 1.005–1.030)
UUROB: 1 mg/dL (ref 0.2–1.0)
pH, UA: 7 (ref 5.0–7.5)

## 2016-07-31 LAB — ABO/RH: Rh Factor: POSITIVE

## 2016-07-31 LAB — CBC
HEMATOCRIT: 33.1 % — AB (ref 34.0–46.6)
Hemoglobin: 9.9 g/dL — ABNORMAL LOW (ref 11.1–15.9)
MCH: 20.8 pg — ABNORMAL LOW (ref 26.6–33.0)
MCHC: 29.9 g/dL — ABNORMAL LOW (ref 31.5–35.7)
MCV: 70 fL — AB (ref 79–97)
Platelets: 232 10*3/uL (ref 150–379)
RBC: 4.76 x10E6/uL (ref 3.77–5.28)
RDW: 18.7 % — ABNORMAL HIGH (ref 12.3–15.4)
WBC: 7.4 10*3/uL (ref 3.4–10.8)

## 2016-07-31 LAB — MICROSCOPIC EXAMINATION: CASTS: NONE SEEN /LPF

## 2016-07-31 LAB — RUBELLA SCREEN: RUBELLA: 3.14 {index} (ref >0.99)

## 2016-07-31 LAB — ANTIBODY SCREEN

## 2016-07-31 LAB — AB SCR+ANTIBODY ID: Antibody Screen: POSITIVE — AB

## 2016-07-31 LAB — SICKLE CELL SCREEN: SICKLE CELL SCREEN: NEGATIVE

## 2016-07-31 LAB — HIV ANTIBODY (ROUTINE TESTING W REFLEX): HIV SCREEN 4TH GENERATION: NONREACTIVE

## 2016-07-31 LAB — RPR: RPR Ser Ql: NONREACTIVE

## 2016-07-31 LAB — HEPATITIS B SURFACE ANTIGEN: Hepatitis B Surface Ag: NEGATIVE

## 2016-07-31 MED ORDER — FERROUS SULFATE 325 (65 FE) MG PO TABS: 325.0000 mg | ORAL_TABLET | Freq: Two times a day (BID) | ORAL | 3 refills | Status: DC

## 2016-08-01 ENCOUNTER — Telehealth: Payer: Self-pay | Admitting: *Deleted

## 2016-08-01 NOTE — Telephone Encounter (Signed)
Informed patient that she is anemic and prescription for iron was sent to pharmacy. Advised to increase daily intake of green leafy vegetables, beans and red meat. Verbalized understanding.

## 2016-08-04 LAB — CYSTIC FIBROSIS MUTATION 97: GENE DIS ANAL CARRIER INTERP BLD/T-IMP: NOT DETECTED

## 2016-08-11 ENCOUNTER — Encounter: Payer: Medicaid Other | Admitting: Obstetrics & Gynecology

## 2016-08-11 ENCOUNTER — Other Ambulatory Visit: Payer: Medicaid Other

## 2016-08-12 ENCOUNTER — Encounter: Payer: Self-pay | Admitting: Obstetrics & Gynecology

## 2016-08-12 ENCOUNTER — Ambulatory Visit (INDEPENDENT_AMBULATORY_CARE_PROVIDER_SITE_OTHER): Payer: Medicaid Other | Admitting: Obstetrics & Gynecology

## 2016-08-12 ENCOUNTER — Ambulatory Visit (INDEPENDENT_AMBULATORY_CARE_PROVIDER_SITE_OTHER): Payer: Medicaid Other

## 2016-08-12 VITALS — BP 90/60 | HR 80 | Wt 169.0 lb

## 2016-08-12 DIAGNOSIS — Z331 Pregnant state, incidental: Secondary | ICD-10-CM

## 2016-08-12 DIAGNOSIS — Z3401 Encounter for supervision of normal first pregnancy, first trimester: Secondary | ICD-10-CM

## 2016-08-12 DIAGNOSIS — Z3682 Encounter for antenatal screening for nuchal translucency: Secondary | ICD-10-CM | POA: Diagnosis not present

## 2016-08-12 DIAGNOSIS — Z1389 Encounter for screening for other disorder: Secondary | ICD-10-CM

## 2016-08-12 DIAGNOSIS — Z3482 Encounter for supervision of other normal pregnancy, second trimester: Secondary | ICD-10-CM

## 2016-08-12 LAB — POCT URINALYSIS DIPSTICK
GLUCOSE UA: NEGATIVE
KETONES UA: NEGATIVE
LEUKOCYTES UA: NEGATIVE
NITRITE UA: NEGATIVE
Protein, UA: NEGATIVE
RBC UA: NEGATIVE

## 2016-08-12 NOTE — Progress Notes (Signed)
US 13+2 wks measurements c/w dates,normal ovaries bilat,ant pl gr 0,subchorionic hemorrhage 4.3 x .4 x 1.9 cm,crl 70.79 mm,NB present,NT 1.5 mm

## 2016-08-12 NOTE — Progress Notes (Signed)
[id number] 5053w2d Estimated Date of Delivery: 02/15/17  Blood pressure 90/60, pulse 80, weight 169 lb (76.7 kg), last menstrual period 05/04/2016.   BP weight and urine results all reviewed and noted.  Please refer to the obstetrical flow sheet for the fundal height and fetal heart rate documentation:  Patient reports good fetal movement, denies any bleeding and no rupture of membranes symptoms or regular contractions. Patient is without complaints. All questions were answered.  Orders Placed This Encounter  Procedures  . Maternal Screen, Integrated #1  . POCT urinalysis dipstick    Plan:  Continued routine obstetrical care, NT is normal  Return in about 4 weeks (around 09/09/2016) for LROB.

## 2016-08-15 LAB — MATERNAL SCREEN, INTEGRATED #1
Crown Rump Length: 70.7 mm
Gest. Age on Collection Date: 13 weeks
MATERNAL AGE AT EDD: 33.3 a
NUCHAL TRANSLUCENCY (NT): 1.5 mm
NUMBER OF FETUSES: 1
PAPP-A VALUE: 1163.7 ng/mL
Weight: 169 [lb_av]

## 2016-08-19 ENCOUNTER — Telehealth: Payer: Self-pay | Admitting: *Deleted

## 2016-08-19 NOTE — Telephone Encounter (Signed)
Patient called with complaints of bleeding after having BM. States she has had very hard stools since taking iron supplements. Encouraged patient to increase fluids, increase diet in fiber or try FiberOne bars, stools softners like Colace or [place]. Verbalized understanding.

## 2016-08-27 ENCOUNTER — Encounter (HOSPITAL_COMMUNITY): Payer: Self-pay | Admitting: *Deleted

## 2016-08-27 ENCOUNTER — Inpatient Hospital Stay (HOSPITAL_COMMUNITY)
Admission: AD | Admit: 2016-08-27 | Discharge: 2016-08-27 | Disposition: A | Discharge status: Home / Self Care | Payer: Medicaid Other | Source: Ambulatory Visit | Attending: Family Medicine | Admitting: Family Medicine

## 2016-08-27 DIAGNOSIS — W108XXA Fall (on) (from) other stairs and steps, initial encounter: Secondary | ICD-10-CM

## 2016-08-27 DIAGNOSIS — W109XXA Fall (on) (from) unspecified stairs and steps, initial encounter: Secondary | ICD-10-CM | POA: Insufficient documentation

## 2016-08-27 DIAGNOSIS — Z87891 Personal history of nicotine dependence: Secondary | ICD-10-CM | POA: Diagnosis not present

## 2016-08-27 DIAGNOSIS — O26892 Other specified pregnancy related conditions, second trimester: Secondary | ICD-10-CM | POA: Insufficient documentation

## 2016-08-27 DIAGNOSIS — Y929 Unspecified place or not applicable: Secondary | ICD-10-CM | POA: Insufficient documentation

## 2016-08-27 DIAGNOSIS — R109 Unspecified abdominal pain: Secondary | ICD-10-CM

## 2016-08-27 DIAGNOSIS — Z3A15 15 weeks gestation of pregnancy: Secondary | ICD-10-CM | POA: Insufficient documentation

## 2016-08-27 DIAGNOSIS — Z3482 Encounter for supervision of other normal pregnancy, second trimester: Secondary | ICD-10-CM

## 2016-08-27 HISTORY — DX: Encounter for other specified aftercare: Z51.89

## 2016-08-27 HISTORY — DX: Anemia, unspecified: D64.9

## 2016-08-27 LAB — URINALYSIS, ROUTINE W REFLEX MICROSCOPIC
Bilirubin Urine: NEGATIVE
GLUCOSE, UA: NEGATIVE mg/dL
HGB URINE DIPSTICK: NEGATIVE
Ketones, ur: NEGATIVE mg/dL
NITRITE: NEGATIVE
PH: 7 (ref 5.0–8.0)
Protein, ur: NEGATIVE mg/dL
SPECIFIC GRAVITY, URINE: 1.023 (ref 1.005–1.030)

## 2016-08-27 NOTE — MAU Provider Note (Signed)
History     CSN: [id number]  Arrival date and time: 08/27/16 1353   None     No chief complaint on file.  HPI  [name] is 31 y.o. [phone] [redacted]w[redacted]d weeks presenting with report of falling down steps yesterday. Had been sitting for an hour, stood up and her legs felt "asleep" then they gave way and she feel forward.  Braced with hands. Denies falling on her abdomen. Had a little pain yesterday but it has worsened to now describes as intermittent sharp pain in her abdomen, more on the left and lower back. Has been taking Tylenol with some relief.  Neg for vaginal bleeding.  Also complains of constipation, hx of IBS. She has begun prenatal care with [name] CNM at [facility].    Past Medical History:  Diagnosis Date  . Anemia   . Blood transfusion without reported diagnosis   . BV (bacterial vaginosis) 01/18/2014  . Contraceptive management 12/06/2013  . Shoulder dislocation, recurrent   . Vaginal discharge 01/18/2014    Past Surgical History:  Procedure Laterality Date  . WISDOM TOOTH EXTRACTION      Family History  Problem Relation Age of Onset  . Diabetes Sister   . Cancer Paternal Grandmother        breast  . Asthma Son   . Diabetes Maternal Aunt   . Cancer Paternal Aunt        cervical, breast  . Colon cancer Neg Hx     Social History  Substance Use Topics  . Smoking status: Former Smoker    Types: Cigarettes    Quit date: 07/22/2004  . Smokeless tobacco: Never Used  . Alcohol use Yes     Comment: occassional    Allergies:  Allergies  Allergen Reactions  . Sulfa Antibiotics Rash    Prescriptions Prior to Admission  Medication Sig Dispense Refill Last Dose  . acetaminophen (TYLENOL) 325 MG tablet Take 650 mg by mouth every 6 (six) hours as needed for headache.   Past Week at Unknown time  . Doxylamine-Pyridoxine ER (BONJESTA) 20-20 MG TBCR Take 1 tablet by mouth at bedtime. Can add 1 tablet in the morning if needed for nausea and vomiting 60 tablet 8  08/27/2016 at Unknown time  . ferrous sulfate 325 (65 FE) MG tablet Take 1 tablet (325 mg total) by mouth 2 (two) times daily with a meal. 60 tablet 3 Past Week at Unknown time  . Prenatal MV-Min-FA-Omega-3 (PRENATAL GUMMIES/DHA & FA) 0.4-32.5 MG CHEW Chew 2 each by mouth every morning.   08/26/2016 at Unknown time  . ondansetron (ZOFRAN ODT) 4 MG disintegrating tablet Take 1 tablet (4 mg total) by mouth every 8 (eight) hours as needed for nausea or vomiting. (Patient not taking: Reported on 07/28/2016) 20 tablet 0 Not Taking  . Prenatal Vit-Fe Fumarate-FA (PRENATAL COMPLETE) 14-0.4 MG TABS Take 1 tablet by mouth daily. (Patient not taking: Reported on 08/27/2016) 60 each 0 Not Taking at Unknown time    Review of Systems  Constitutional: Negative for activity change and fatigue.  Respiratory: Negative for chest tightness.   Cardiovascular: Negative for chest pain.  Gastrointestinal: Positive for abdominal pain (lower left abdominal pain) and constipation.  Genitourinary: Negative for dysuria, flank pain, frequency, hematuria, pelvic pain and vaginal bleeding.  Musculoskeletal: Positive for back pain (lower left ).       Legs "fall asleep"  Neurological: Negative for dizziness and weakness. Numbness: feel fall asleep.   Physical Exam  Blood pressure 116/69, pulse 90, temperature 97.6 F (36.4 C), temperature source Oral, resp. rate 18, weight 166 lb (75.3 kg), last menstrual period 05/04/2016, SpO2 100 %.  Physical Exam  Nursing note and vitals reviewed. Constitutional: She is oriented to person, place, and time. She appears well-developed and well-nourished. No distress.  HENT:  Head: Normocephalic.  Neck: Normal range of motion.  Cardiovascular: Normal rate.   Respiratory: Effort normal.  GI: Soft. She exhibits no distension and no mass. There is tenderness (mild tenderness on the left side). There is no rebound and no guarding.  Genitourinary: There is no rash, tenderness or lesion on the  right labia. There is no rash, tenderness or lesion on the left labia. Uterus is enlarged (15-16 week size). Uterus is not tender. Cervix exhibits no motion tenderness and no discharge. There is bleeding in the vagina. No erythema or tenderness in the vagina. No vaginal discharge found.  Genitourinary Comments: Fetal heart tones dopplered at 146 BPM  Neurological: She is alert and oriented to person, place, and time.  Skin: Skin is warm and dry.  Psychiatric: She has a normal mood and affect. Her behavior is normal. Thought content normal.   Results for orders placed or performed during the hospital encounter of 08/27/16 (from the past 24 hour(s))  Urinalysis, Routine w reflex microscopic     Status: Abnormal   Collection Time: 08/27/16  2:17 PM  Result Value Ref Range   Color, Urine YELLOW YELLOW   APPearance CLEAR CLEAR   Specific Gravity, Urine 1.023 1.005 - 1.030   pH 7.0 5.0 - 8.0   Glucose, UA NEGATIVE NEGATIVE mg/dL   Hgb urine dipstick NEGATIVE NEGATIVE   Bilirubin Urine NEGATIVE NEGATIVE   Ketones, ur NEGATIVE NEGATIVE mg/dL   Protein, ur NEGATIVE NEGATIVE mg/dL   Nitrite NEGATIVE NEGATIVE   Leukocytes, UA TRACE (A) NEGATIVE   RBC / HPF 0-5 0 - 5 RBC/hpf   WBC, UA 0-5 0 - 5 WBC/hpf   Bacteria, UA RARE (A) NONE SEEN   Squamous Epithelial / LPF 6-30 (A) NONE SEEN   Mucous PRESENT     MAU Course  Procedures  MDM MSE Exam  Doppler-fetal heart tones  Assessment and Plan  A:   Fall down steps      Abdominal pain at 1443w3d gestation      Viable pregnancy with FHR 146 bpm  P:  Discussed falls and prevention related to being aware with feet are tingly before standing.  Avoid crossing knees or ankles     Stay well hydrated     Call [facility] for worsen sxs  Eve M Key 08/27/2016, 3:43 PM

## 2016-08-27 NOTE — MAU Note (Signed)
Fell down the steps yesterday, had a little bit of pain.... Has gotten worse.  Now is having sharp pains in her stomach, esp on the left side and pain in her low back.  Has also been very constipated

## 2016-08-27 NOTE — Discharge Instructions (Signed)
Abdominal Pain During Pregnancy Abdominal pain is common in pregnancy. Most of the time, it does not cause harm. There are many causes of abdominal pain. Some causes are more serious than others and sometimes the cause is not known. Abdominal pain can be a sign that something is very wrong with the pregnancy or the pain may have nothing to do with the pregnancy. Always tell your health care provider if you have any abdominal pain. Follow these instructions at home:  Do not have sex or put anything in your vagina until your symptoms go away completely.  Watch your abdominal pain for any changes.  Get plenty of rest until your pain improves.  Drink enough fluid to keep your urine clear or pale yellow.  Take over-the-counter or prescription medicines only as told by your health care provider.  Keep all follow-up visits as told by your health care provider. This is important. Contact a health care provider if:  You have a fever.  Your pain gets worse or you have cramping.  Your pain continues after resting. Get help right away if:  You are bleeding, leaking fluid, or passing tissue from the vagina.  You have vomiting or diarrhea that does not go away.  You have painful or bloody urination.  You notice a decrease in your baby's movements.  You feel very weak or faint.  You have shortness of breath.  You develop a severe headache with abdominal pain.  You have abnormal vaginal discharge with abdominal pain. This information is not intended to replace advice given to you by your health care provider. Make sure you discuss any questions you have with your health care provider. Document Released: 02/10/2005 Document Revised: 11/22/2015 Document Reviewed: 09/09/2012 Elsevier Interactive Patient Education  2018 [employer] Fall Prevention in the Home Falls can cause injuries and can affect people from all age groups. There are many simple things that you can do to make your home  safe and to help prevent falls. What can I do on the outside of my home?  Regularly repair the edges of walkways and driveways and fix any cracks.  Remove high doorway thresholds.  Trim any shrubbery on the main path into your home.  Use bright outdoor lighting.  Clear walkways of debris and clutter, including tools and rocks.  Regularly check that handrails are securely fastened and in good repair. Both sides of any steps should have handrails.  Install guardrails along the edges of any raised decks or porches.  Have leaves, snow, and ice cleared regularly.  Use sand or salt on walkways during winter months.  In the garage, clean up any spills right away, including grease or oil spills. What can I do in the bathroom?  Use night lights.  Install grab bars by the toilet and in the tub and shower. Do not use towel bars as grab bars.  Use non-skid mats or decals on the floor of the tub or shower.  If you need to sit down while you are in the shower, use a plastic, non-slip stool.  Keep the floor dry. Immediately clean up any water that spills on the floor.  Remove soap buildup in the tub or shower on a regular basis.  Attach bath mats securely with double-sided non-slip rug tape.  Remove throw rugs and other tripping hazards from the floor. What can I do in the bedroom?  Use night lights.  Make sure that a bedside light is easy to reach.  Do not use  oversized bedding that drapes onto the floor.  Have a firm chair that has side arms to use for getting dressed.  Remove throw rugs and other tripping hazards from the floor. What can I do in the kitchen?  Clean up any spills right away.  Avoid walking on wet floors.  Place frequently used items in easy-to-reach places.  If you need to reach for something above you, use a sturdy step stool that has a grab bar.  Keep electrical cables out of the way.  Do not use floor polish or wax that makes floors slippery. If  you have to use wax, make sure that it is non-skid floor wax.  Remove throw rugs and other tripping hazards from the floor. What can I do in the stairways?  Do not leave any items on the stairs.  Make sure that there are handrails on both sides of the stairs. Fix handrails that are broken or loose. Make sure that handrails are as long as the stairways.  Check any carpeting to make sure that it is firmly attached to the stairs. Fix any carpet that is loose or worn.  Avoid having throw rugs at the top or bottom of stairways, or secure the rugs with carpet tape to prevent them from moving.  Make sure that you have a light switch at the top of the stairs and the bottom of the stairs. If you do not have them, have them installed. What are some other fall prevention tips?  Wear closed-toe shoes that fit well and support your feet. Wear shoes that have rubber soles or low heels.  When you use a stepladder, make sure that it is completely opened and that the sides are firmly locked. Have someone hold the ladder while you are using it. Do not climb a closed stepladder.  Add color or contrast paint or tape to grab bars and handrails in your home. Place contrasting color strips on the first and last steps.  Use mobility aids as needed, such as canes, walkers, scooters, and crutches.  Turn on lights if it is dark. Replace any light bulbs that burn out.  Set up furniture so that there are clear paths. Keep the furniture in the same spot.  Fix any uneven floor surfaces.  Choose a carpet design that does not hide the edge of steps of a stairway.  Be aware of any and all pets.  Review your medicines with your healthcare provider. Some medicines can cause dizziness or changes in blood pressure, which increase your risk of falling. Talk with your health care provider about other ways that you can decrease your risk of falls. This may include working with a physical therapist or trainer to improve  your strength, balance, and endurance. This information is not intended to replace advice given to you by your health care provider. Make sure you discuss any questions you have with your health care provider. Document Released: 01/31/2002 Document Revised: 07/10/2015 Document Reviewed: 03/17/2014 Elsevier Interactive Patient Education  2017 [employer].

## 2016-09-09 ENCOUNTER — Ambulatory Visit (INDEPENDENT_AMBULATORY_CARE_PROVIDER_SITE_OTHER): Payer: Medicaid Other | Admitting: Advanced Practice Midwife

## 2016-09-09 ENCOUNTER — Encounter: Payer: Self-pay | Admitting: Advanced Practice Midwife

## 2016-09-09 VITALS — BP 104/52 | HR 85 | Wt 166.0 lb

## 2016-09-09 DIAGNOSIS — Z3A17 17 weeks gestation of pregnancy: Secondary | ICD-10-CM

## 2016-09-09 DIAGNOSIS — Z1389 Encounter for screening for other disorder: Secondary | ICD-10-CM

## 2016-09-09 DIAGNOSIS — Z3482 Encounter for supervision of other normal pregnancy, second trimester: Secondary | ICD-10-CM

## 2016-09-09 DIAGNOSIS — F5089 Other specified eating disorder: Secondary | ICD-10-CM | POA: Insufficient documentation

## 2016-09-09 DIAGNOSIS — Z363 Encounter for antenatal screening for malformations: Secondary | ICD-10-CM

## 2016-09-09 DIAGNOSIS — Z1379 Encounter for other screening for genetic and chromosomal anomalies: Secondary | ICD-10-CM

## 2016-09-09 DIAGNOSIS — Z331 Pregnant state, incidental: Secondary | ICD-10-CM

## 2016-09-09 DIAGNOSIS — Z862 Personal history of diseases of the blood and blood-forming organs and certain disorders involving the immune mechanism: Secondary | ICD-10-CM | POA: Diagnosis not present

## 2016-09-09 LAB — POCT URINALYSIS DIPSTICK
Blood, UA: NEGATIVE
Glucose, UA: NEGATIVE
KETONES UA: NEGATIVE
Leukocytes, UA: NEGATIVE
Nitrite, UA: NEGATIVE
PROTEIN UA: NEGATIVE

## 2016-09-09 LAB — POCT HEMOGLOBIN: HEMOGLOBIN: 10.6 g/dL — AB (ref 12.2–16.2)

## 2016-09-09 NOTE — Patient Instructions (Addendum)
For Headaches:   Stay well hydrated, drink enough water so that your urine is clear, sometimes if you are dehydrated you can get headaches  Eat small frequent meals and snacks, sometimes if you are hungry you can get headaches  Sometimes you get headaches during pregnancy from the pregnancy hormones  You can try tylenol (1-2 regular strength 325mg  or 1-2 extra strength 500mg ) as directed on the box. The least amount of medication that works is best.   Cool compresses (cool wet washcloth or ice pack) to area of head that is hurting  You can also try drinking a caffeinated drink to see if this will help  If not helping, try below:  For Prevention of Headaches/Migraines:  CoQ10 100mg  three times daily  Vitamin B2 400mg  daily  Magnesium Oxide 400-600mg  daily  If You Get a Bad Headache/Migraine:  Benadryl 25mg    Magnesium Oxide  1 large Gatorade  2 extra strength Tylenol (1,000mg  total)  1 cup coffee or Coke  If this doesn't help please call us @ [phone]  [employer]  Waterbirth  [facility]

## 2016-09-09 NOTE — Progress Notes (Signed)
[id number] 987w2d Estimated Date of Delivery: 02/15/17  Blood pressure (!) 104/52, pulse 85, weight 166 lb (75.3 kg), last menstrual period 05/04/2016.   BP weight and urine results all reviewed and noted.  Please refer to the obstetrical flow sheet for the fundal height and fetal heart rate documentation:  Patient reports good fetal movement, denies any bleeding and no rupture of membranes symptoms or regular contractions. Patient is having some HAs, not today. Tips given  All questions were answered.  Orders Placed This Encounter  Procedures  . US OB Comp + 14 Wk  . Maternal Screen, Integrated #2  . POCT urinalysis dipstick  . POCT hemoglobin    Plan:  Continued routine obstetrical care, 2nd IT  Return in about 2 weeks (around 09/23/2016) for LROB, [id number]:Anatomy.

## 2016-09-17 LAB — MATERNAL SCREEN, INTEGRATED #2
AFP MoM: 1.33
Alpha-Fetoprotein: 43.3 ng/mL
CROWN RUMP LENGTH: 70.7 mm
DIA MoM: 1.32
DIA Value: 203.2 pg/mL
Estriol, Unconjugated: 1.08 ng/mL
GESTATIONAL AGE: 17 wk
Gest. Age on Collection Date: 13 weeks
Maternal Age at EDD: 33.3 yr
NUMBER OF FETUSES: 1
Nuchal Translucency (NT): 1.5 mm
Nuchal Translucency MoM: 0.86
PAPP-A MoM: 1.19
PAPP-A VALUE: 1163.7 ng/mL
TEST RESULTS: NEGATIVE
WEIGHT: 169 [lb_av]
Weight: 169 [lb_av]
hCG MoM: 1.41
hCG Value: 36.4 IU/mL
uE3 MoM: 1.17

## 2016-09-18 ENCOUNTER — Telehealth: Payer: Self-pay | Admitting: Obstetrics and Gynecology

## 2016-09-18 NOTE — Telephone Encounter (Signed)
Pt states that she feels like she has a yeast infection. Has some white d/c and irritation when she wipes. Informed pt that she could try OTC monistat 7. I advised pt to call us back if symptoms do not improve or worsen and we would get her in to see a provider. Pt verbalized understanding.

## 2016-09-18 NOTE — Telephone Encounter (Signed)
Patient called and left a message stating that she would like to speak with either Tish or a provider, Patient states that she has a yeast infection and would like to take something for it. Pt states that she could take something over the counter or does she need an appointment. Please contact pt

## 2016-09-23 ENCOUNTER — Encounter: Payer: Self-pay | Admitting: Women's Health

## 2016-09-26 ENCOUNTER — Ambulatory Visit (INDEPENDENT_AMBULATORY_CARE_PROVIDER_SITE_OTHER): Payer: Medicaid Other

## 2016-09-26 ENCOUNTER — Ambulatory Visit (INDEPENDENT_AMBULATORY_CARE_PROVIDER_SITE_OTHER): Payer: Medicaid Other | Admitting: Obstetrics and Gynecology

## 2016-09-26 ENCOUNTER — Encounter: Payer: Self-pay | Admitting: Obstetrics and Gynecology

## 2016-09-26 VITALS — BP 110/40 | HR 70 | Wt 165.0 lb

## 2016-09-26 DIAGNOSIS — Z3A19 19 weeks gestation of pregnancy: Secondary | ICD-10-CM

## 2016-09-26 DIAGNOSIS — F5089 Other specified eating disorder: Secondary | ICD-10-CM

## 2016-09-26 DIAGNOSIS — Z1389 Encounter for screening for other disorder: Secondary | ICD-10-CM

## 2016-09-26 DIAGNOSIS — Z3402 Encounter for supervision of normal first pregnancy, second trimester: Secondary | ICD-10-CM

## 2016-09-26 DIAGNOSIS — Z363 Encounter for antenatal screening for malformations: Secondary | ICD-10-CM | POA: Diagnosis not present

## 2016-09-26 DIAGNOSIS — Z3482 Encounter for supervision of other normal pregnancy, second trimester: Secondary | ICD-10-CM

## 2016-09-26 DIAGNOSIS — Z331 Pregnant state, incidental: Secondary | ICD-10-CM

## 2016-09-26 LAB — POCT URINALYSIS DIPSTICK
Blood, UA: NEGATIVE
Glucose, UA: NEGATIVE
LEUKOCYTES UA: NEGATIVE
NITRITE UA: NEGATIVE

## 2016-09-26 NOTE — Progress Notes (Signed)
Patient ID: [name], female   DOB: 06/21/1985, 31 y.o.   MRN: [id number]  Estimated Date of Delivery: 02/15/17 LROB 5747w5d  No chief complaint on file. ____  Patient complaints 1. Considering water birth 2. desieres BTL.  ROS: Bleeding after coming off of last OCP and she became anemic. Pt repots she needed to have a transfusion. Wondering the cons of water birthing.   Patient reports good fetal movement, denies any bleeding , rupture of membranes,or regular contractions.  Last menstrual period 05/04/2016.   Urine results:notable for 3+ Ketones trace protein otherwise negative refer to the ob flow sheet for FH and FHR, ,                          Physical Examination: General appearance - alert, well appearing, and in no distress                                      Abdomen - FH 14 cm                                                         -FHR 145 per US  Discussion: 1.Discussed with pt risks and benefits of permanent sterilization. Advised pt of the 3 time frames in which she could have the procedure: in the hospital after delivery, 4 wks post partum prior to becoming sexually active and 6 months post partum after risk of crib death has passed. Encouraged pt to wait for the 6 month mark after the risk of crib death has passed.  2. Discussed risks vs benefits of using clips only vs bilateral salpingectomy. Advised pt that bilateral salpingectomy is a permanent procedure, but reduces the risk of cancer by 2/3. Pt informed that bilateral salpingectomy would be an option at 4 weeks and 6 months post partum, but not 1 day post partum.   At end of discussion, pt had opportunity to ask questions and has no further questions at this time.   Specific discussion of permanent sterilization as noted above. Greater than 50% was spent in counseling and coordination of care with the patient.   Total time greater than: 15 minutes.     Questions were answered. Assessment: LROB [id number] @  3647w5d   Plan:  Continued routine obstetrical care  F/u in 4 weeks for LROB, PN2, and to sign tubal ligation paper w Follow up at 28 weeks for Hgb level check to r/o anemia.   By signing my name below, I, Diona BrownerJennifer Gorman, attest that this documentation has been prepared under the direction and in the presence of Tilda BurrowFerguson, Jeannetta Cerutti V, MD. Electronically Signed: Diona BrownerJennifer Gorman, Medical Scribe. 09/26/16. 11:51 AM.  I personally performed the services described in this documentation, which was SCRIBED in my presence. The recorded information has been reviewed and considered accurate. It has been edited as necessary during review. Tilda BurrowFERGUSON,Lakysha Kossman V, MD

## 2016-09-26 NOTE — Progress Notes (Signed)
US 19+5 wks,cephalic,ant pl gr 0,cx 3.2 cm,svp of fluid 5.7 cm,normal ovaries bilat,fhr 145 bpm,ant subchorionic hemorrhage 7.5 x 1.7 x 2.3 cm,anatomy complete,efw 329 g

## 2016-10-01 ENCOUNTER — Encounter: Payer: Self-pay | Admitting: Advanced Practice Midwife

## 2016-10-01 ENCOUNTER — Other Ambulatory Visit: Payer: Self-pay | Admitting: Advanced Practice Midwife

## 2016-10-01 MED ORDER — BUTALBITAL-APAP-CAFFEINE 50-325-40 MG PO TABS: 1.0000 | ORAL_TABLET | Freq: Four times a day (QID) | ORAL | 0 refills | Status: DC | PRN

## 2016-10-01 NOTE — Progress Notes (Signed)
firorcet for HA

## 2016-10-21 ENCOUNTER — Encounter: Payer: Self-pay | Admitting: Advanced Practice Midwife

## 2016-10-23 ENCOUNTER — Encounter: Payer: Self-pay | Admitting: Women's Health

## 2016-10-23 ENCOUNTER — Ambulatory Visit (INDEPENDENT_AMBULATORY_CARE_PROVIDER_SITE_OTHER): Payer: Medicaid Other | Admitting: Women's Health

## 2016-10-23 VITALS — BP 96/52 | HR 80 | Wt 165.0 lb

## 2016-10-23 DIAGNOSIS — F5089 Other specified eating disorder: Secondary | ICD-10-CM

## 2016-10-23 DIAGNOSIS — O99343 Other mental disorders complicating pregnancy, third trimester: Secondary | ICD-10-CM

## 2016-10-23 DIAGNOSIS — Z3A23 23 weeks gestation of pregnancy: Secondary | ICD-10-CM

## 2016-10-23 DIAGNOSIS — O468X2 Other antepartum hemorrhage, second trimester: Secondary | ICD-10-CM

## 2016-10-23 DIAGNOSIS — O418X92 Other specified disorders of amniotic fluid and membranes, unspecified trimester, fetus 2: Secondary | ICD-10-CM

## 2016-10-23 DIAGNOSIS — Z1389 Encounter for screening for other disorder: Secondary | ICD-10-CM

## 2016-10-23 DIAGNOSIS — O468X9 Other antepartum hemorrhage, unspecified trimester: Secondary | ICD-10-CM | POA: Insufficient documentation

## 2016-10-23 DIAGNOSIS — O418X90 Other specified disorders of amniotic fluid and membranes, unspecified trimester, not applicable or unspecified: Secondary | ICD-10-CM | POA: Insufficient documentation

## 2016-10-23 DIAGNOSIS — Z3482 Encounter for supervision of other normal pregnancy, second trimester: Secondary | ICD-10-CM

## 2016-10-23 DIAGNOSIS — Z331 Pregnant state, incidental: Secondary | ICD-10-CM

## 2016-10-23 LAB — POCT URINALYSIS DIPSTICK
Blood, UA: NEGATIVE
Glucose, UA: NEGATIVE
KETONES UA: NEGATIVE
Leukocytes, UA: NEGATIVE
Nitrite, UA: NEGATIVE

## 2016-10-23 NOTE — Progress Notes (Signed)
Low-risk OB appointment [id number] 3713w4d Estimated Date of Delivery: 02/15/17 BP (!) 96/52   Pulse 80   Wt 165 lb (74.8 kg)   LMP 05/04/2016 (Approximate)   BMI 28.32 kg/m   BP, weight, and urine reviewed.  Refer to obstetrical flow sheet for FH & FHR.  Reports good fm.  Denies regular uc's, lof, vb, or uti s/s. Some headaches and sleeping problems- discussed and info given on both Reviewed ptl s/s, fm. Plan:  Continue routine obstetrical care  F/U in 4wks for OB appointment and pn2

## 2016-10-23 NOTE — Patient Instructions (Addendum)
You will have your sugar test next visit.  Please do not eat or drink anything after midnight the night before you come, not even water.  You will be here for at least two hours.     Call the office [phone]) or go to [facility] if:  You begin to have strong, frequent contractions  Your water breaks.  Sometimes it is a big gush of fluid, sometimes it is just a trickle that keeps getting your panties wet or running down your legs  You have vaginal bleeding.  It is normal to have a small amount of spotting if your cervix was checked.   You don't feel your baby moving like normal.  If you don't, get you something to eat and drink and lay down and focus on feeling your baby move.   If your baby is still not moving like normal, you should call the office or go to [facility].  For Headaches:   Stay well hydrated, drink enough water so that your urine is clear, sometimes if you are dehydrated you can get headaches  Eat small frequent meals and snacks, sometimes if you are hungry you can get headaches  Sometimes you get headaches during pregnancy from the pregnancy hormones  You can try tylenol (1-2 regular strength 325mg  or 1-2 extra strength 500mg ) as directed on the box. The least amount of medication that works is best.   Cool compresses (cool wet washcloth or ice pack) to area of head that is hurting  You can also try drinking a caffeinated drink to see if this will help  If not helping, try below:  For Prevention of Headaches/Migraines:  CoQ10 100mg  three times daily  Vitamin B2 400mg  daily  Magnesium Oxide 400-600mg  daily  If You Get a Bad Headache/Migraine:  Benadryl 25mg    Magnesium Oxide  1 large Gatorade  2 extra strength Tylenol (1,000mg  total)  1 cup coffee or Coke  If this doesn't help please call us @ [phone]   Tips to Help You Sleep Better:   Get into a bedtime routine, try to do the same thing every night before going to bed to try to  help your body wind down  Warm baths  Avoid caffeine for at least 3 hours before going to sleep   Keep your room at a slightly cooler temperature, can try running a fan  Turn off TV, lights, phone, electronics  Lots of pillows if needed to help you get comfortable  Lavender scented items can help you sleep. You can place lavender essential oil on a cotton ball and place under your pillowcase, or place in a diffuser. [employer] has a lavender scented sleep line (plug-ins, sprays, etc). Look in the pillow aisle for lavender scented pillows.   If none of the above things help, you can try 1/2 to 1 tablet of benadryl, unisom, or tylenol pm. Do not take this every night, only when you really need it.     Second Trimester of Pregnancy The second trimester is from week 13 through week 28, months 4 through 6. The second trimester is often a time when you feel your best. Your body has also adjusted to being pregnant, and you begin to feel better physically. Usually, morning sickness has lessened or quit completely, you may have more energy, and you may have an increase in appetite. The second trimester is also a time when the fetus is growing rapidly. At the end of the sixth month, the fetus is  about 9 inches long and weighs about 1 pounds. You will likely begin to feel the baby move (quickening) between 18 and 20 weeks of the pregnancy. BODY CHANGES Your body goes through many changes during pregnancy. The changes vary from woman to woman.   Your weight will continue to increase. You will notice your lower abdomen bulging out.  You may begin to get stretch marks on your hips, abdomen, and breasts.  You may develop headaches that can be relieved by medicines approved by your health care provider.  You may urinate more often because the fetus is pressing on your bladder.  You may develop or continue to have heartburn as a result of your pregnancy.  You may develop constipation because certain  hormones are causing the muscles that push waste through your intestines to slow down.  You may develop hemorrhoids or swollen, bulging veins (varicose veins).  You may have back pain because of the weight gain and pregnancy hormones relaxing your joints between the bones in your pelvis and as a result of a shift in weight and the muscles that support your balance.  Your breasts will continue to grow and be tender.  Your gums may bleed and may be sensitive to brushing and flossing.  Dark spots or blotches (chloasma, mask of pregnancy) may develop on your face. This will likely fade after the baby is born.  A dark line from your belly button to the pubic area (linea nigra) may appear. This will likely fade after the baby is born.  You may have changes in your hair. These can include thickening of your hair, rapid growth, and changes in texture. Some women also have hair loss during or after pregnancy, or hair that feels dry or thin. Your hair will most likely return to normal after your baby is born. WHAT TO EXPECT AT YOUR PRENATAL VISITS During a routine prenatal visit:  You will be weighed to make sure you and the fetus are growing normally.  Your blood pressure will be taken.  Your abdomen will be measured to track your baby's growth.  The fetal heartbeat will be listened to.  Any test results from the previous visit will be discussed. Your health care provider may ask you:  How you are feeling.  If you are feeling the baby move.  If you have had any abnormal symptoms, such as leaking fluid, bleeding, severe headaches, or abdominal cramping.  If you have any questions. Other tests that may be performed during your second trimester include:  Blood tests that check for:  Low iron levels (anemia).  Gestational diabetes (between 24 and 28 weeks).  Rh antibodies.  Urine tests to check for infections, diabetes, or protein in the urine.  An ultrasound to confirm the proper  growth and development of the baby.  An amniocentesis to check for possible genetic problems.  Fetal screens for spina bifida and Down syndrome. HOME CARE INSTRUCTIONS   Avoid all smoking, herbs, alcohol, and unprescribed drugs. These chemicals affect the formation and growth of the baby.  Follow your health care provider's instructions regarding medicine use. There are medicines that are either safe or unsafe to take during pregnancy.  Exercise only as directed by your health care provider. Experiencing uterine cramps is a good sign to stop exercising.  Continue to eat regular, healthy meals.  Wear a good support bra for breast tenderness.  Do not use hot tubs, steam rooms, or saunas.  Wear your seat belt at all times  when driving.  Avoid raw meat, uncooked cheese, cat litter boxes, and soil used by cats. These carry germs that can cause birth defects in the baby.  Take your prenatal vitamins.  Try taking a stool softener (if your health care provider approves) if you develop constipation. Eat more high-fiber foods, such as fresh vegetables or fruit and whole grains. Drink plenty of fluids to keep your urine clear or pale yellow.  Take warm sitz baths to soothe any pain or discomfort caused by hemorrhoids. Use hemorrhoid cream if your health care provider approves.  If you develop varicose veins, wear support hose. Elevate your feet for 15 minutes, 3-4 times a day. Limit salt in your diet.  Avoid heavy lifting, wear low heel shoes, and practice good posture.  Rest with your legs elevated if you have leg cramps or low back pain.  Visit your dentist if you have not gone yet during your pregnancy. Use a soft toothbrush to brush your teeth and be gentle when you floss.  A sexual relationship may be continued unless your health care provider directs you otherwise.  Continue to go to all your prenatal visits as directed by your health care provider. SEEK MEDICAL CARE IF:   You  have dizziness.  You have mild pelvic cramps, pelvic pressure, or nagging pain in the abdominal area.  You have persistent nausea, vomiting, or diarrhea.  You have a bad smelling vaginal discharge.  You have pain with urination. SEEK IMMEDIATE MEDICAL CARE IF:   You have a fever.  You are leaking fluid from your vagina.  You have spotting or bleeding from your vagina.  You have severe abdominal cramping or pain.  You have rapid weight gain or loss.  You have shortness of breath with chest pain.  You notice sudden or extreme swelling of your face, hands, ankles, feet, or legs.  You have not felt your baby move in over an hour.  You have severe headaches that do not go away with medicine.  You have vision changes. Document Released: 02/04/2001 Document Revised: 02/15/2013 Document Reviewed: 04/13/2012 [facility] Patient Information 2015 [place], [place]. This information is not intended to replace advice given to you by your health care provider. Make sure you discuss any questions you have with your health care provider.

## 2016-10-24 ENCOUNTER — Encounter: Payer: Medicaid Other | Admitting: Women's Health

## 2016-11-03 ENCOUNTER — Encounter: Payer: Self-pay | Admitting: Advanced Practice Midwife

## 2016-11-07 ENCOUNTER — Telehealth: Payer: Self-pay | Admitting: *Deleted

## 2016-11-07 MED ORDER — TERCONAZOLE 0.4 % VA CREA: 1.0000 | TOPICAL_CREAM | Freq: Every day | VAGINAL | 0 refills | Status: DC

## 2016-11-07 NOTE — Telephone Encounter (Signed)
Patient called will complaints of a possible yeast infection. She has tried the 7 day Monistat but is still having some itching. She would like to not have to drive from [place] in the weather if possible but will if she needs to. Please advise.

## 2016-11-07 NOTE — Telephone Encounter (Signed)
Informed patient Terconazole was sent to pharmacy. Informed to take all of medication even if symptoms are gone. Verbalized understanding.

## 2016-11-12 ENCOUNTER — Encounter: Payer: Self-pay | Admitting: Advanced Practice Midwife

## 2016-11-17 ENCOUNTER — Inpatient Hospital Stay (HOSPITAL_COMMUNITY)
Admission: AD | Admit: 2016-11-17 | Discharge: 2016-11-17 | Disposition: A | Discharge status: Home / Self Care | Payer: Medicaid Other | Source: Ambulatory Visit | Attending: Family Medicine | Admitting: Family Medicine

## 2016-11-17 ENCOUNTER — Encounter (HOSPITAL_COMMUNITY): Payer: Self-pay

## 2016-11-17 DIAGNOSIS — K5900 Constipation, unspecified: Secondary | ICD-10-CM | POA: Insufficient documentation

## 2016-11-17 DIAGNOSIS — R42 Dizziness and giddiness: Secondary | ICD-10-CM | POA: Diagnosis not present

## 2016-11-17 DIAGNOSIS — Z87891 Personal history of nicotine dependence: Secondary | ICD-10-CM | POA: Diagnosis not present

## 2016-11-17 DIAGNOSIS — O99612 Diseases of the digestive system complicating pregnancy, second trimester: Secondary | ICD-10-CM | POA: Diagnosis not present

## 2016-11-17 DIAGNOSIS — Z3A27 27 weeks gestation of pregnancy: Secondary | ICD-10-CM | POA: Diagnosis not present

## 2016-11-17 DIAGNOSIS — O99012 Anemia complicating pregnancy, second trimester: Secondary | ICD-10-CM | POA: Insufficient documentation

## 2016-11-17 DIAGNOSIS — D649 Anemia, unspecified: Secondary | ICD-10-CM | POA: Insufficient documentation

## 2016-11-17 DIAGNOSIS — R109 Unspecified abdominal pain: Secondary | ICD-10-CM | POA: Diagnosis present

## 2016-11-17 DIAGNOSIS — O26892 Other specified pregnancy related conditions, second trimester: Secondary | ICD-10-CM | POA: Insufficient documentation

## 2016-11-17 HISTORY — DX: Irritable bowel syndrome, unspecified: K58.9

## 2016-11-17 LAB — CBC
HEMATOCRIT: 29.4 % — AB (ref 36.0–46.0)
Hemoglobin: 9.3 g/dL — ABNORMAL LOW (ref 12.0–15.0)
MCH: 23.6 pg — AB (ref 26.0–34.0)
MCHC: 31.6 g/dL (ref 30.0–36.0)
MCV: 74.6 fL — AB (ref 78.0–100.0)
PLATELETS: 182 10*3/uL (ref 150–400)
RBC: 3.94 MIL/uL (ref 3.87–5.11)
RDW: 17.5 % — ABNORMAL HIGH (ref 11.5–15.5)
WBC: 11.1 10*3/uL — ABNORMAL HIGH (ref 4.0–10.5)

## 2016-11-17 LAB — COMPREHENSIVE METABOLIC PANEL
ALT: 10 U/L — ABNORMAL LOW (ref 14–54)
ANION GAP: 7 (ref 5–15)
AST: 17 U/L (ref 15–41)
Albumin: 3 g/dL — ABNORMAL LOW (ref 3.5–5.0)
Alkaline Phosphatase: 65 U/L (ref 38–126)
BILIRUBIN TOTAL: 0.6 mg/dL (ref 0.3–1.2)
BUN: 7 mg/dL (ref 6–20)
CO2: 24 mmol/L (ref 22–32)
Calcium: 8.5 mg/dL — ABNORMAL LOW (ref 8.9–10.3)
Chloride: 100 mmol/L — ABNORMAL LOW (ref 101–111)
Creatinine, Ser: 0.53 mg/dL (ref 0.44–1.00)
Glucose, Bld: 114 mg/dL — ABNORMAL HIGH (ref 65–99)
POTASSIUM: 3 mmol/L — AB (ref 3.5–5.1)
Sodium: 131 mmol/L — ABNORMAL LOW (ref 135–145)
TOTAL PROTEIN: 6.8 g/dL (ref 6.5–8.1)

## 2016-11-17 LAB — URINALYSIS, ROUTINE W REFLEX MICROSCOPIC
BILIRUBIN URINE: NEGATIVE
GLUCOSE, UA: 50 mg/dL — AB
HGB URINE DIPSTICK: NEGATIVE
Ketones, ur: 5 mg/dL — AB
NITRITE: NEGATIVE
PH: 7 (ref 5.0–8.0)
Protein, ur: 100 mg/dL — AB
Specific Gravity, Urine: 1.023 (ref 1.005–1.030)

## 2016-11-17 MED ORDER — LACTATED RINGERS IV BOLUS (SEPSIS)
1000.0000 mL | Freq: Once | INTRAVENOUS | Status: AC
  Administered 2016-11-17: 1000 mL via INTRAVENOUS

## 2016-11-17 NOTE — MAU Note (Addendum)
Pt arrived EMS. Was in class and was having LLQ pain 5/10, was also feeling chest and back pressure. Put her head down on the desk, but was feeling dizzy. Went to the bathroom and things got blurry and wasn't feeling good. Also now has a headache 4/10. Did eat today and CBG in EMS was 95. All VS stable with them. Now abdominal pain is more lower, central.  Pt is feeling rectal pressure along with the pain

## 2016-11-17 NOTE — Discharge Instructions (Signed)
Anemia, Nonspecific Anemia is a condition in which the concentration of red blood cells or hemoglobin in the blood is below normal. Hemoglobin is a substance in red blood cells that carries oxygen to the tissues of the body. Anemia results in not enough oxygen reaching these tissues. What are the causes? Common causes of anemia include:  Excessive bleeding. Bleeding may be internal or external. This includes excessive bleeding from periods (in women) or from the intestine.  Poor nutrition.  Chronic kidney, thyroid, and liver disease.  Bone marrow disorders that decrease red blood cell production.  Cancer and treatments for cancer.  HIV, AIDS, and their treatments.  Spleen problems that increase red blood cell destruction.  Blood disorders.  Excess destruction of red blood cells due to infection, medicines, and autoimmune disorders.  What are the signs or symptoms?  Minor weakness.  Dizziness.  Headache.  Palpitations.  Shortness of breath, especially with exercise.  Paleness.  Cold sensitivity.  Indigestion.  Nausea.  Difficulty sleeping.  Difficulty concentrating. Symptoms may occur suddenly or they may develop slowly. How is this diagnosed? Additional blood tests are often needed. These help your health care provider determine the best treatment. Your health care provider will check your stool for blood and look for other causes of blood loss. How is this treated? Treatment varies depending on the cause of the anemia. Treatment can include:  Supplements of iron, vitamin B12, or folic acid.  Hormone medicines.  A blood transfusion. This may be needed if blood loss is severe.  Hospitalization. This may be needed if there is significant continual blood loss.  Dietary changes.  Spleen removal.  Follow these instructions at home: Keep all follow-up appointments. It often takes many weeks to correct anemia, and having your health care provider check on  your condition and your response to treatment is very important. Get help right away if:  You develop extreme weakness, shortness of breath, or chest pain.  You become dizzy or have trouble concentrating.  You develop heavy vaginal bleeding.  You develop a rash.  You have bloody or black, tarry stools.  You faint.  You vomit up blood.  You vomit repeatedly.  You have abdominal pain.  You have a fever or persistent symptoms for more than 2-3 days.  You have a fever and your symptoms suddenly get worse.  You are dehydrated. This information is not intended to replace advice given to you by your health care provider. Make sure you discuss any questions you have with your health care provider. Document Released: 03/20/2004 Document Revised: 07/25/2015 Document Reviewed: 08/06/2012 Elsevier Interactive Patient Education  2017 [employer]. Constipation, Adult Constipation is when a person has fewer bowel movements in a week than normal, has difficulty having a bowel movement, or has stools that are dry, hard, or larger than normal. Constipation may be caused by an underlying condition. It may become worse with age if a person takes certain medicines and does not take in enough fluids. Follow these instructions at home: Eating and drinking   Eat foods that have a lot of fiber, such as fresh fruits and vegetables, whole grains, and beans.  Limit foods that are high in fat, low in fiber, or overly processed, such as french fries, hamburgers, cookies, candies, and soda.  Drink enough fluid to keep your urine clear or pale yellow. General instructions  Exercise regularly or as told by your health care provider.  Go to the restroom when you have the urge to go.  Do not hold it in.  Take over-the-counter and prescription medicines only as told by your health care provider. These include any fiber supplements.  Practice pelvic floor retraining exercises, such as deep breathing  while relaxing the lower abdomen and pelvic floor relaxation during bowel movements.  Watch your condition for any changes.  Keep all follow-up visits as told by your health care provider. This is important. Contact a health care provider if:  You have pain that gets worse.  You have a fever.  You do not have a bowel movement after 4 days.  You vomit.  You are not hungry.  You lose weight.  You are bleeding from the anus.  You have thin, pencil-like stools. Get help right away if:  You have a fever and your symptoms suddenly get worse.  You leak stool or have blood in your stool.  Your abdomen is bloated.  You have severe pain in your abdomen.  You feel dizzy or you faint. This information is not intended to replace advice given to you by your health care provider. Make sure you discuss any questions you have with your health care provider. Document Released: 11/09/2003 Document Revised: 08/31/2015 Document Reviewed: 08/01/2015 Elsevier Interactive Patient Education  2017 [employer].

## 2016-11-17 NOTE — MAU Provider Note (Signed)
Chief Complaint:  Abdominal Pain and Dizziness   First Provider Initiated Contact with Patient 11/17/16 1040     HPI  HPI: [name] is a 31 y.o. G4P3003 at [redacted]w[redacted]d who presents to maternity admissions reporting dizziness, blurry vision, headache, and pelvic pressure. This morning she ate a sandwich for breakfast and went to class as usual. She was sitting in class and began to feel dizzy. She had some soda to try and help with no success. She left the classroom to get some water and had to sit down in the hallway due to dizziness. She lost consciousness for a brief period of time. EMS was called and she was brought here. She now has a headache, feels pelvic pressure, and has pain in her chest. She recently developed a yeast infection. A trial of Monostat was unsuccessful and received terconazole from OB last week. She additionally endorses frequent urination, but only minimal amounts. She has had no vaginal bleeding and no leakage of fluid. This pregnancy has been complicated by ketones in her urine and subchorionic hemorrhage noted on ultrasound 09/26/16. No previous history of diabetes or other pregnancy related complications.   Past Medical History: Past Medical History:  Diagnosis Date  . Anemia   . Blood transfusion without reported diagnosis   . IBS (irritable bowel syndrome)    with constipation  . Shoulder dislocation, recurrent     Past obstetric history: OB History  Gravida Para Term Preterm AB Living  SAB TAB Ectopic Multiple Live Births          3    # Outcome Date GA Lbr Len/2nd Weight Sex Delivery Anes PTL Lv  4 Current           3 Term 12/02/08 [redacted]w[redacted]d  3.459 kg (7 lb 10 oz) M Vag-Spont None N LIV  2 Term 08/18/06 [redacted]w[redacted]d  3.289 kg (7 lb 4 oz) F Vag-Spont None Y LIV  1 Term 08/14/04 [redacted]w[redacted]d  3.43 kg (7 lb 9 oz) M Vag-Spont EPI N LIV      Past Surgical History: Past Surgical History:  Procedure Laterality Date  . WISDOM TOOTH EXTRACTION      Family  History: Family History  Problem Relation Age of Onset  . Diabetes Sister   . Cancer Paternal Grandmother        breast  . Asthma Son   . Diabetes Maternal Aunt   . Cancer Paternal Aunt        cervical, breast  . Colon cancer Neg Hx     Social History: Social History  Substance Use Topics  . Smoking status: Former Smoker    Types: Cigarettes    Quit date: 07/22/2004  . Smokeless tobacco: Never Used  . Alcohol use No     Comment: none since +UPT    Allergies:  Allergies  Allergen Reactions  . Sulfa Antibiotics Rash    Meds:  Prescriptions Prior to Admission  Medication Sig Dispense Refill Last Dose  . butalbital-acetaminophen-caffeine (FIORICET, ESGIC) 50-325-40 MG tablet Take 1 tablet by mouth every 6 (six) hours as needed for headache. 20 tablet 0 Past Week at Unknown time  . ferrous sulfate 325 (65 FE) MG tablet Take 1 tablet (325 mg total) by mouth 2 (two) times daily with a meal. (Patient taking differently: Take 325 mg by mouth 2 (two) times daily with a meal. Takes every other day) 60 tablet 3 Past Week at Unknown time  .  Prenatal MV-Min-FA-Omega-3 (PRENATAL GUMMIES/DHA & FA) 0.4-32.5 MG CHEW Chew 2 each by mouth every morning.   Past Month at Unknown time  . terconazole (TERAZOL 7) 0.4 % vaginal cream Place 1 applicator vaginally at bedtime. 45 g 0 11/16/2016 at Unknown time  . Doxylamine-Pyridoxine ER (BONJESTA) 20-20 MG TBCR Take 1 tablet by mouth at bedtime. Can add 1 tablet in the morning if needed for nausea and vomiting (Patient not taking: Reported on 11/17/2016) 60 tablet 8 Not Taking at Unknown time    I have reviewed patient's Past Medical Hx, Surgical Hx, Family Hx, Social Hx, medications and allergies.   ROS:  Review of Systems  Constitutional: Negative for fever.  HENT: Negative for congestion and sore throat.   Respiratory: Positive for shortness of breath.   Cardiovascular: Positive for chest pain.  Gastrointestinal: Negative for nausea and vomiting.   Genitourinary: Positive for decreased urine volume, frequency and vaginal discharge. Negative for vaginal bleeding.  Neurological: Positive for dizziness and headaches.   Other systems negative  Physical Exam   Patient Vitals for the past 24 hrs:  BP Temp Temp src Pulse Resp SpO2  11/17/16 1110 (!) 115/50 - - 96 - -  11/17/16 1108 - - - - - 100 %  11/17/16 1107 - - - - - 100 %  11/17/16 1106 (!) 102/56 - - (!) 105 - -  11/17/16 1105 (!) 115/59 - - 84 - -  11/17/16 1104 (!) 119/59 - - 81 - 100 %  11/17/16 1027 - 98 F (36.7 C) Oral - 18 -  11/17/16 1025 118/62 - - 94 - -   Constitutional: Well-developed, well-nourished female in no acute distress.  Cardiovascular: normal rate and rhythm Respiratory: normal effort, clear to auscultation bilaterally GI: Abd soft, non-tender, gravid appropriate for gestational age.   No rebound or guarding. MS: Extremities nontender, no edema, normal ROM Neurologic: Alert and oriented x 4.  GU: Neg CVAT. Dilation: Closed Effacement (%): Thick Station: -3 Exam by:: Estanislado Spire NP  FHT:  Baseline 140 , moderate variability, accelerations present, no decelerations Contractions: none   Labs: Results for orders placed or performed during the hospital encounter of 11/17/16 (from the past 24 hour(s))  Urinalysis, Routine w reflex microscopic     Status: Abnormal   Collection Time: 11/17/16 10:55 AM  Result Value Ref Range   Color, Urine YELLOW YELLOW   APPearance HAZY (A) CLEAR   Specific Gravity, Urine 1.023 1.005 - 1.030   pH 7.0 5.0 - 8.0   Glucose, UA 50 (A) NEGATIVE mg/dL   Hgb urine dipstick NEGATIVE NEGATIVE   Bilirubin Urine NEGATIVE NEGATIVE   Ketones, ur 5 (A) NEGATIVE mg/dL   Protein, ur 161 (A) NEGATIVE mg/dL   Nitrite NEGATIVE NEGATIVE   Leukocytes, UA MODERATE (A) NEGATIVE   RBC / HPF 0-5 0 - 5 RBC/hpf   WBC, UA 0-5 0 - 5 WBC/hpf   Bacteria, UA RARE (A) NONE SEEN   Squamous Epithelial / LPF 6-30 (A) NONE SEEN   Mucus PRESENT    CBC     Status: Abnormal   Collection Time: 11/17/16 11:14 AM  Result Value Ref Range   WBC 11.1 (H) 4.0 - 10.5 K/uL   RBC 3.94 3.87 - 5.11 MIL/uL   Hemoglobin 9.3 (L) 12.0 - 15.0 g/dL   HCT 09.6 (L) 04.5 - 40.9 %   MCV 74.6 (L) 78.0 - 100.0 fL   MCH 23.6 (L) 26.0 - 34.0 pg   MCHC  31.6 30.0 - 36.0 g/dL   RDW 54.0 (H) 98.1 - 19.1 %   Platelets 182 150 - 400 K/uL  Comprehensive metabolic panel     Status: Abnormal   Collection Time: 11/17/16 11:14 AM  Result Value Ref Range   Sodium 131 (L) 135 - 145 mmol/L   Potassium 3.0 (L) 3.5 - 5.1 mmol/L   Chloride 100 (L) 101 - 111 mmol/L   CO2 24 22 - 32 mmol/L   Glucose, Bld 114 (H) 65 - 99 mg/dL   BUN 7 6 - 20 mg/dL   Creatinine, Ser 4.78 0.44 - 1.00 mg/dL   Calcium 8.5 (L) 8.9 - 10.3 mg/dL   Total Protein 6.8 6.5 - 8.1 g/dL   Albumin 3.0 (L) 3.5 - 5.0 g/dL   AST 17 15 - 41 U/L   ALT 10 (L) 14 - 54 U/L   Alkaline Phosphatase 65 38 - 126 U/L   Total Bilirubin 0.6 0.3 - 1.2 mg/dL   GFR calc non Af Amer >60 >60 mL/min   GFR calc Af Amer >60 >60 mL/min   Anion gap 7 5 - 15   O/Positive/-- (06/04 1616)  Imaging:  No results found.  MAU Course/MDM: I have ordered labs and reviewed results.  NST reviewed EKG reviewed- normal Treatments in MAU included IV fluids.    Assessment: 1. Dizziness   2. Constipation during pregnancy in second trimester   3. Anemia complicating pregnancy in second trimester     Plan: Dehydration  - IV Fluids, 1L LR. Additonally increase PO fluid intake -- pt reports improvement of symptoms Chest pain  - EKG- normal Anemia  - Encouraged to restart her oral iron supplement  - Recommended stool softeners to help with constipation caused by iron  Discharge home Labor precautions and fetal kick counts Follow up in Office for prenatal visits and recheck  Pt stable at time of discharge.  Elna Breslow, MS3 11/17/2016  12:14 PM  I confirm that I have verified the information documented in the med  student's note and that I have also personally performed the physical exam and all medical decision making activities. Pt reports improvement in symptoms with IV fluids.  No ctx on monitor, Cervix closed/thick.  Judeth Horn, NP

## 2016-11-20 ENCOUNTER — Ambulatory Visit (INDEPENDENT_AMBULATORY_CARE_PROVIDER_SITE_OTHER): Payer: Medicaid Other | Admitting: Obstetrics and Gynecology

## 2016-11-20 ENCOUNTER — Encounter: Payer: Self-pay | Admitting: Obstetrics and Gynecology

## 2016-11-20 ENCOUNTER — Other Ambulatory Visit: Payer: Medicaid Other

## 2016-11-20 VITALS — BP 120/50 | HR 87 | Wt 170.4 lb

## 2016-11-20 DIAGNOSIS — Z3483 Encounter for supervision of other normal pregnancy, third trimester: Secondary | ICD-10-CM

## 2016-11-20 DIAGNOSIS — Z3482 Encounter for supervision of other normal pregnancy, second trimester: Secondary | ICD-10-CM

## 2016-11-20 DIAGNOSIS — Z3A27 27 weeks gestation of pregnancy: Secondary | ICD-10-CM

## 2016-11-20 DIAGNOSIS — Z3009 Encounter for other general counseling and advice on contraception: Secondary | ICD-10-CM

## 2016-11-20 DIAGNOSIS — Z1389 Encounter for screening for other disorder: Secondary | ICD-10-CM

## 2016-11-20 DIAGNOSIS — Z308 Encounter for other contraceptive management: Secondary | ICD-10-CM

## 2016-11-20 DIAGNOSIS — Z331 Pregnant state, incidental: Secondary | ICD-10-CM

## 2016-11-20 DIAGNOSIS — Z131 Encounter for screening for diabetes mellitus: Secondary | ICD-10-CM

## 2016-11-20 LAB — POCT URINALYSIS DIPSTICK
Glucose, UA: NEGATIVE
NITRITE UA: NEGATIVE
Protein, UA: NEGATIVE
RBC UA: NEGATIVE

## 2016-11-20 NOTE — Progress Notes (Signed)
Patient ID: [name], female   DOB: 1985/09/04, 31 y.o.   MRN: [id number]  Estimated Date of Delivery: 02/15/17 [facility] [redacted]w[redacted]d  Chief Complaint  Patient presents with  . Routine Prenatal Visit    PN2; went to [facility] Monday after passing out at school  ____  Patient complaints: Pt would like to have her tubes tied while she is in the hospital. Otherwise pt denies any other sx or complaints at this time.  Patient reports good fetal movement, denies any bleeding, rupture of membranes,or regular contractions.  Blood pressure (!) 120/50, pulse 87, weight 170 lb 6.4 oz (77.3 kg), last menstrual period 05/04/2016.   Urine results:notable for trace ketones, trace leukocytes, otherwise negative refer to the ob flow sheet for FH and FHR, ,                          Physical Examination: General appearance - alert, well appearing, and in no distress and oriented to person, place, and time Abdomen - FH 28 cm,                  -FHR 132                                            Questions were answered. Assessment:  1. LROB [id number] @ [redacted]w[redacted]d   Plan:  1. Continued routine obstetrical care,  2. Tubal ligation after giving birth 3. F/u in 4 weeks for LROB  By signing my name below, I, Diona Browner, attest that this documentation has been prepared under the direction and in the presence of Tilda Burrow, MD. Electronically Signed: Diona Browner, Medical Scribe. 11/20/16. 10:23 AM.  I personally performed the services described in this documentation, which was SCRIBED in my presence. The recorded information has been reviewed and considered accurate. It has been edited as necessary during review. Tilda Burrow, MD

## 2016-11-21 LAB — CBC
HEMATOCRIT: 31.4 % — AB (ref 34.0–46.6)
Hemoglobin: 9.8 g/dL — ABNORMAL LOW (ref 11.1–15.9)
MCH: 23.3 pg — ABNORMAL LOW (ref 26.6–33.0)
MCHC: 31.2 g/dL — ABNORMAL LOW (ref 31.5–35.7)
MCV: 75 fL — AB (ref 79–97)
Platelets: 214 10*3/uL (ref 150–379)
RBC: 4.21 x10E6/uL (ref 3.77–5.28)
RDW: 18.1 % — ABNORMAL HIGH (ref 12.3–15.4)
WBC: 12.5 10*3/uL — ABNORMAL HIGH (ref 3.4–10.8)

## 2016-11-21 LAB — RPR: RPR Ser Ql: NONREACTIVE

## 2016-11-21 LAB — GLUCOSE TOLERANCE, 2 HOURS W/ 1HR
GLUCOSE, 2 HOUR: 121 mg/dL (ref 65–152)
Glucose, 1 hour: 132 mg/dL (ref 65–179)
Glucose, Fasting: 75 mg/dL (ref 65–91)

## 2016-11-21 LAB — HIV ANTIBODY (ROUTINE TESTING W REFLEX): HIV SCREEN 4TH GENERATION: NONREACTIVE

## 2016-11-21 LAB — ANTIBODY SCREEN: Antibody Screen: NEGATIVE

## 2016-11-26 ENCOUNTER — Encounter: Payer: Self-pay | Admitting: *Deleted

## 2016-11-26 ENCOUNTER — Encounter: Payer: Self-pay | Admitting: Obstetrics and Gynecology

## 2016-12-15 IMAGING — CR DG SHOULDER 2+V PORT*R*
1 series · 2 of 2 positions shown · non-contrast
Comparison: Film earlier today.

CLINICAL DATA: Reduction of dislocated right shoulder.

EXAM:
PORTABLE RIGHT SHOULDER - 2+ VIEW

[Series 2: ap · 0.17mm/px · 2 of 2 slices shown]
[im 1/2]
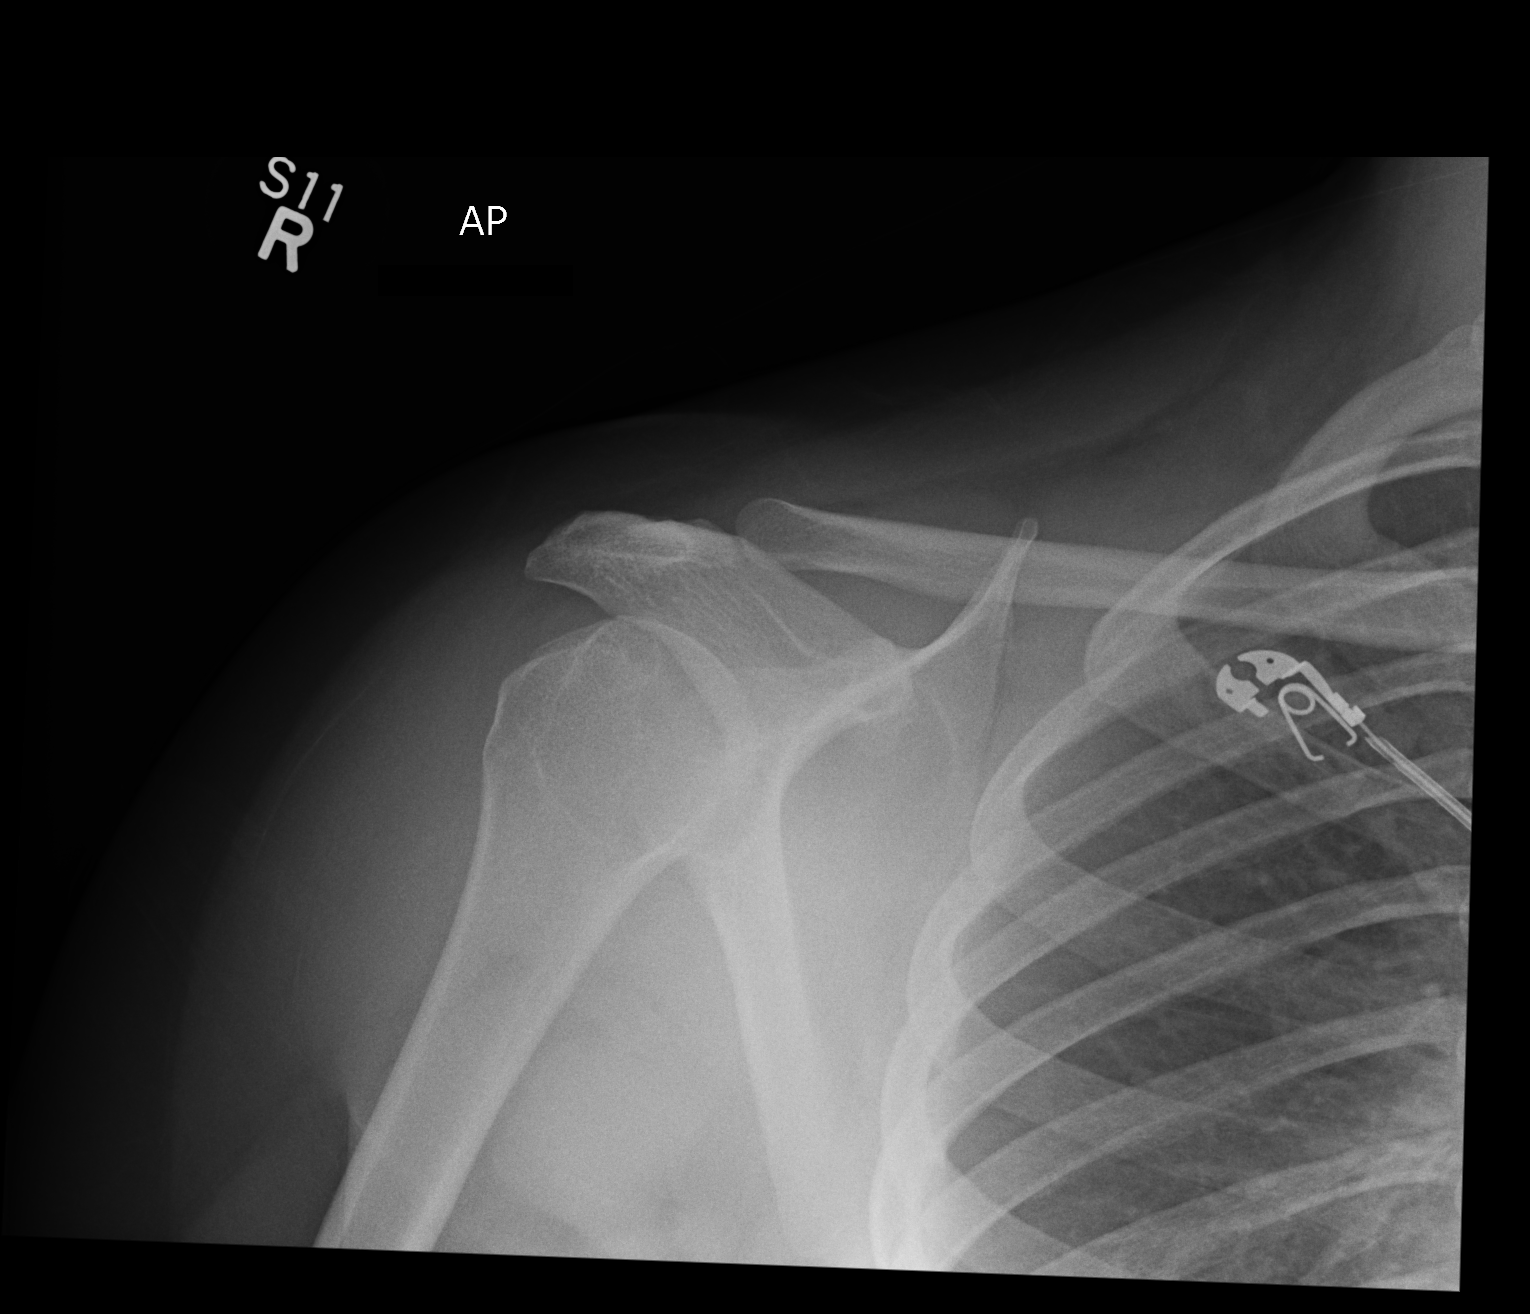
[im 2/2]
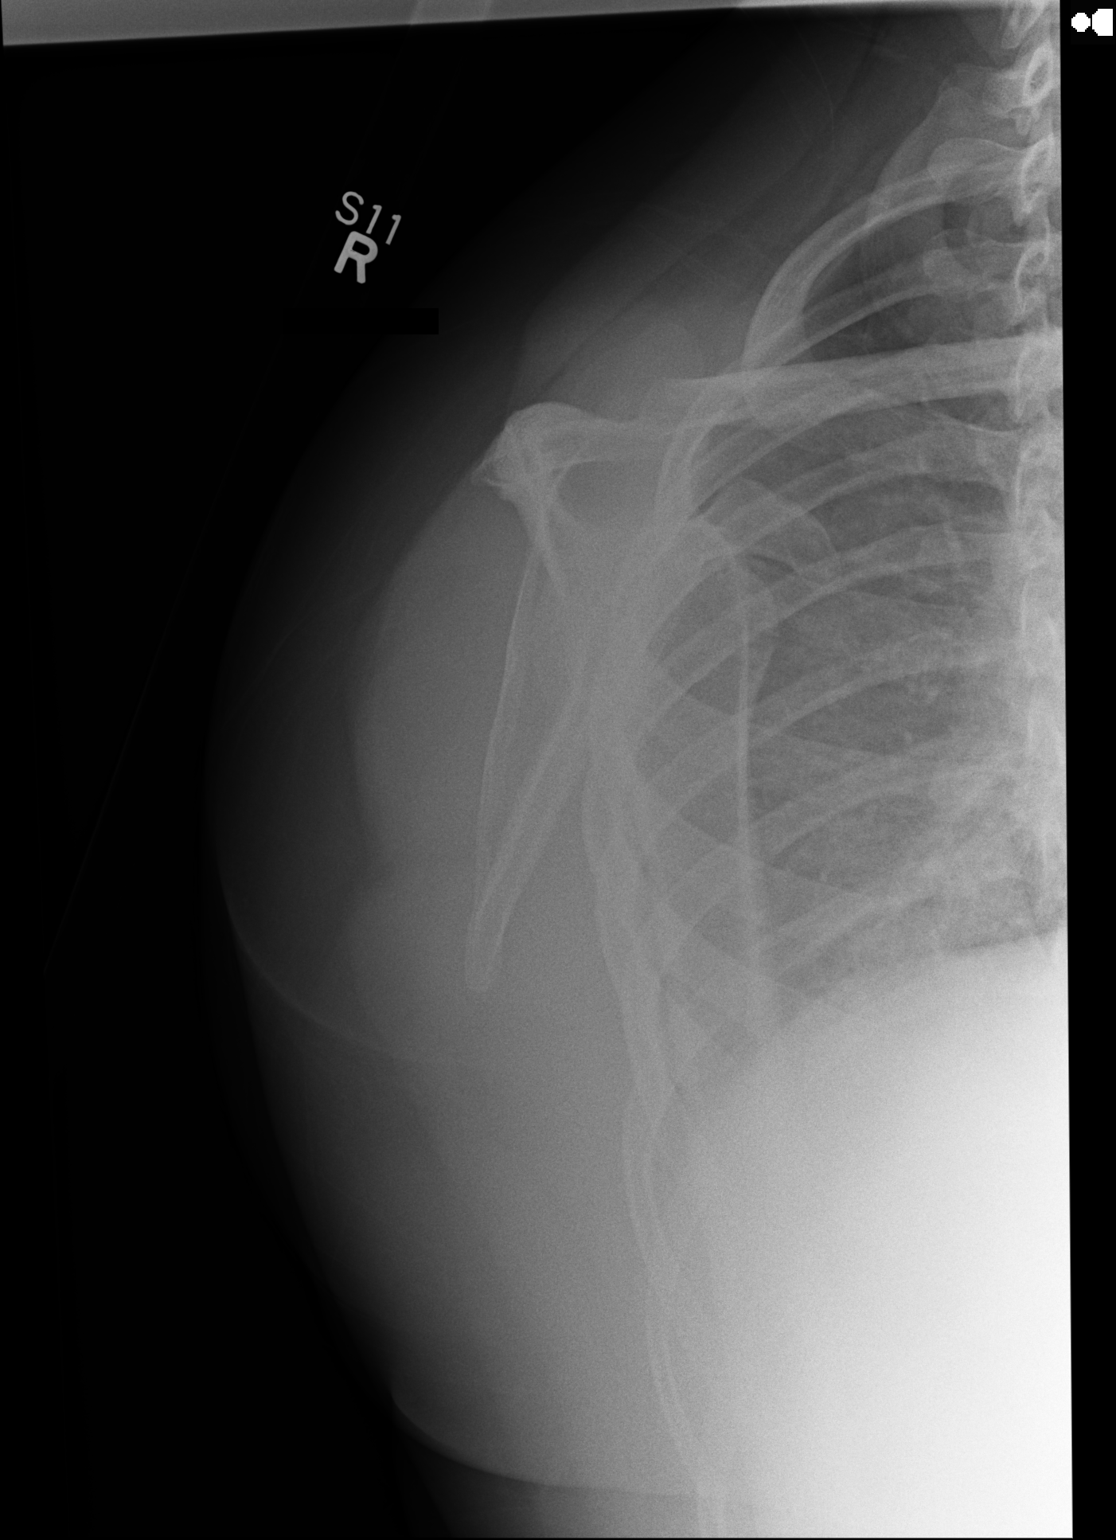

[2 of 2 positions shown; findings below may reference images not displayed]

FINDINGS: The right glenohumeral joint now shows normal alignment and there is
no evidence of fracture. No underlying bony lesion identified.
IMPRESSION: Normal alignment of right glenohumeral joint after reduction of
dislocation. No visible fracture.

## 2016-12-15 IMAGING — CR DG SHOULDER 2+V PORT*R*
1 series · 2 of 2 positions shown · non-contrast
Comparison: October 27, 2014

CLINICAL DATA: Dislocation after sudden movement

EXAM:
PORTABLE RIGHT SHOULDER - 2+ VIEW

[Series 2: ap · 0.17mm/px · 2 of 2 slices shown]
[im 1/2]
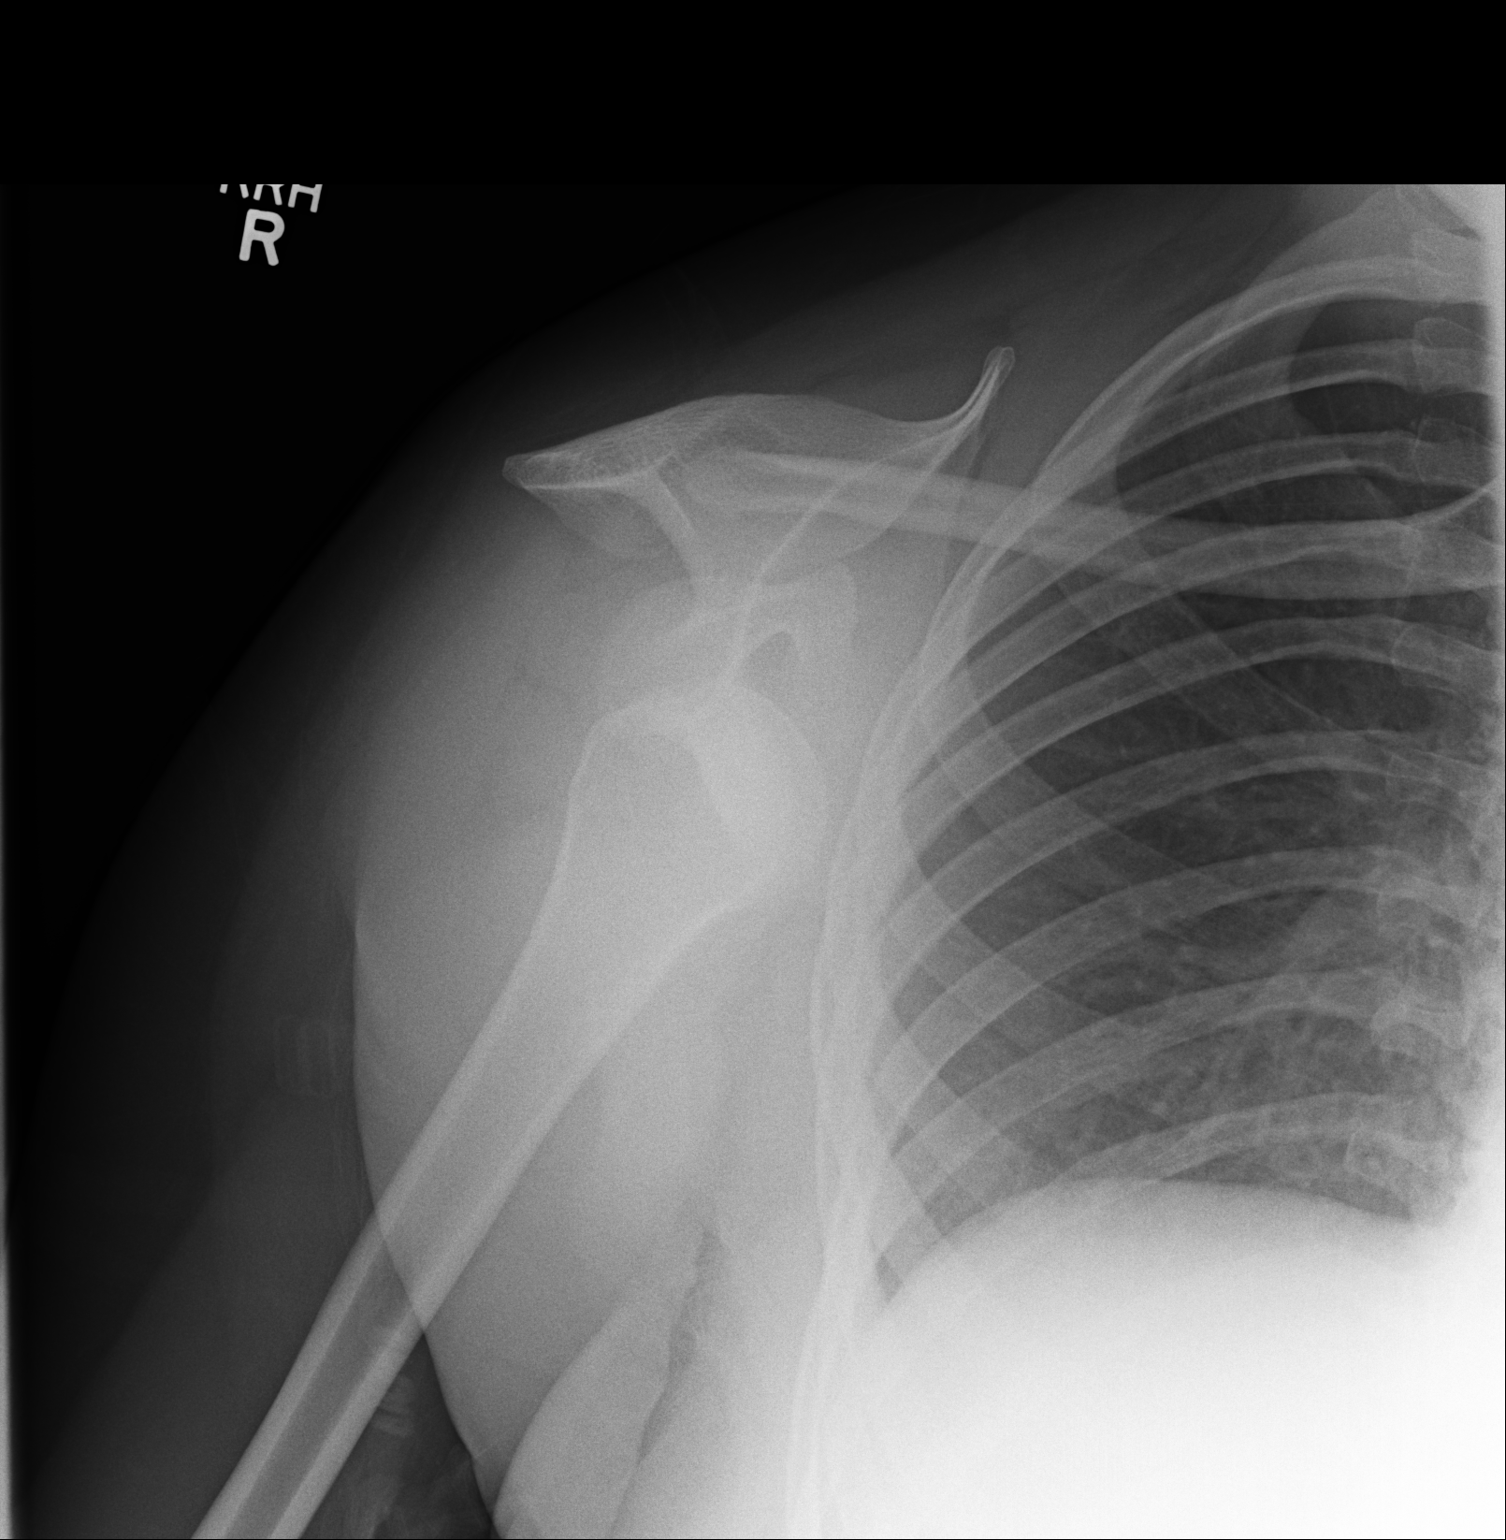
[im 2/2]
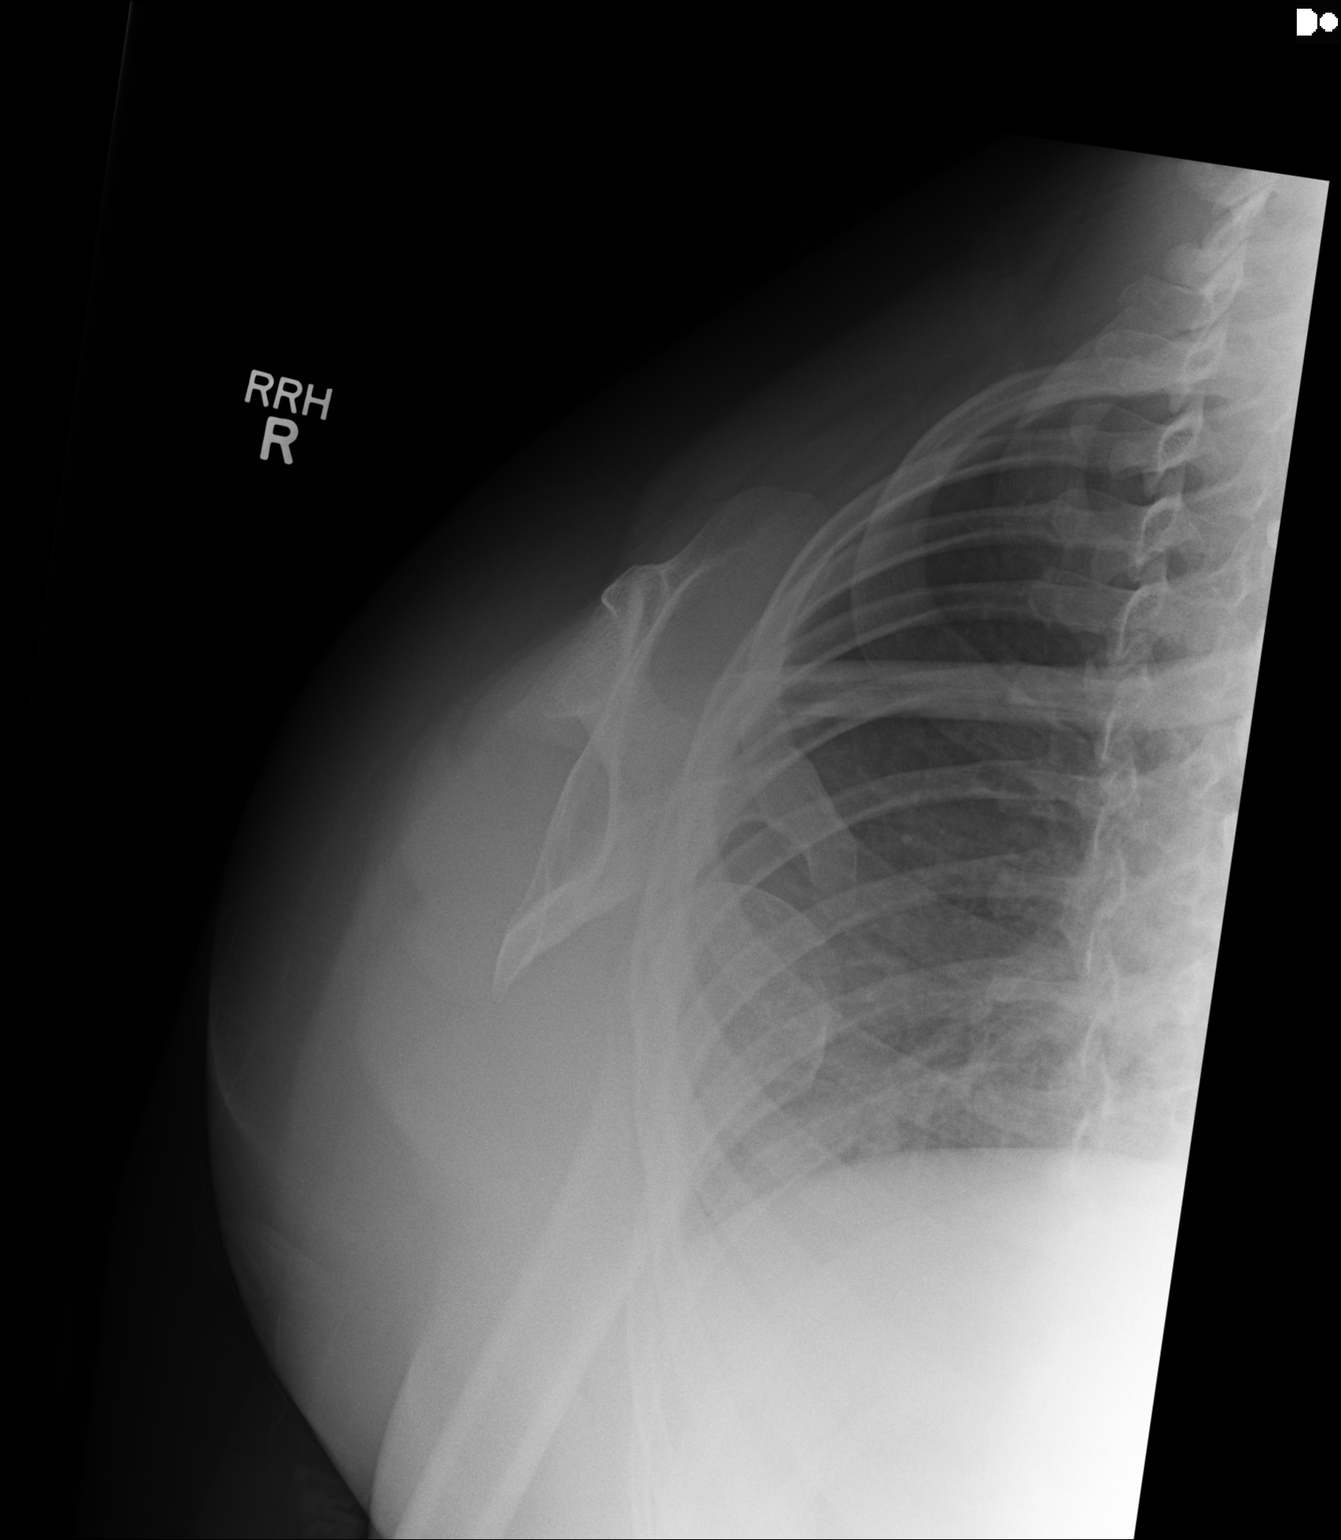

[2 of 2 positions shown; findings below may reference images not displayed]

FINDINGS: Frontal and Y scapular images obtained. There is a subcoracoid
anterior dislocation. No fracture evident. No appreciable
arthropathy. Visualized right lung clear.
IMPRESSION: Subcoracoid anterior dislocation.  No fracture evident.

## 2016-12-18 ENCOUNTER — Encounter: Payer: Self-pay | Admitting: Women's Health

## 2016-12-18 ENCOUNTER — Encounter: Payer: Medicaid Other | Admitting: Obstetrics & Gynecology

## 2016-12-19 ENCOUNTER — Encounter: Payer: Self-pay | Admitting: Women's Health

## 2016-12-19 ENCOUNTER — Ambulatory Visit (INDEPENDENT_AMBULATORY_CARE_PROVIDER_SITE_OTHER): Payer: Medicaid Other | Admitting: Women's Health

## 2016-12-19 VITALS — BP 100/52 | HR 90 | Wt 169.5 lb

## 2016-12-19 DIAGNOSIS — D649 Anemia, unspecified: Secondary | ICD-10-CM | POA: Diagnosis not present

## 2016-12-19 DIAGNOSIS — O99013 Anemia complicating pregnancy, third trimester: Secondary | ICD-10-CM | POA: Diagnosis not present

## 2016-12-19 DIAGNOSIS — N898 Other specified noninflammatory disorders of vagina: Secondary | ICD-10-CM | POA: Diagnosis not present

## 2016-12-19 DIAGNOSIS — M5442 Lumbago with sciatica, left side: Secondary | ICD-10-CM

## 2016-12-19 DIAGNOSIS — O99343 Other mental disorders complicating pregnancy, third trimester: Secondary | ICD-10-CM

## 2016-12-19 DIAGNOSIS — Z3483 Encounter for supervision of other normal pregnancy, third trimester: Secondary | ICD-10-CM

## 2016-12-19 DIAGNOSIS — Z3A31 31 weeks gestation of pregnancy: Secondary | ICD-10-CM

## 2016-12-19 DIAGNOSIS — Z308 Encounter for other contraceptive management: Secondary | ICD-10-CM

## 2016-12-19 DIAGNOSIS — O2243 Hemorrhoids in pregnancy, third trimester: Secondary | ICD-10-CM

## 2016-12-19 DIAGNOSIS — R300 Dysuria: Secondary | ICD-10-CM | POA: Diagnosis not present

## 2016-12-19 DIAGNOSIS — F5089 Other specified eating disorder: Secondary | ICD-10-CM

## 2016-12-19 DIAGNOSIS — Z331 Pregnant state, incidental: Secondary | ICD-10-CM

## 2016-12-19 DIAGNOSIS — O26893 Other specified pregnancy related conditions, third trimester: Secondary | ICD-10-CM

## 2016-12-19 DIAGNOSIS — Z1389 Encounter for screening for other disorder: Secondary | ICD-10-CM

## 2016-12-19 LAB — POCT WET PREP (WET MOUNT)
CLUE CELLS WET PREP WHIFF POC: NEGATIVE
Trichomonas Wet Prep HPF POC: ABSENT

## 2016-12-19 LAB — POCT URINALYSIS DIPSTICK
Blood, UA: NEGATIVE
GLUCOSE UA: NEGATIVE
KETONES UA: NEGATIVE
Nitrite, UA: NEGATIVE

## 2016-12-19 LAB — POCT HEMOGLOBIN: Hemoglobin: 8.4 g/dL — AB (ref 12.2–16.2)

## 2016-12-19 NOTE — Progress Notes (Signed)
LOW-RISK PREGNANCY VISIT Patient name: [name] MRN [id number]  Date of birth: 12-15-1985 Chief Complaint:   Routine Prenatal Visit (+ pressure with urination; pain in left side and lower back; lightheaded in am; hemorrhoids)  History of Present Illness:   [name] is a 31 y.o. [id number] female at [redacted]w[redacted]d with an Estimated Date of Delivery: 02/15/17 being seen today for ongoing management of a low-risk pregnancy.  Today she reports constipation- went almost 3wks w/o bm, then finally had one the other day, made her hemorrhoids flare up- has been using 2 otc hemorrhoid creams which have made them shrink back down. Not doing anything for the constipation. Some dizziness in mornings. Hasn't been taking her Fe, states it was making her constipated. Is eating flour again, 4 spoonfuls 'many times' a day, also lots of ice. Some low back pain that shoots down Lt leg. Also had some pressure with urination the other day- no other uti sx. Had stringy discharge w/ slight odor, no itching/irritation. Wants in-hosp BTL.  Contractions: Not present. Vag. Bleeding: None.  Movement: Present. denies leaking of fluid. Review of Systems:   Pertinent items are noted in HPI Denies headaches, visual changes, shortness of breath, chest pain, abdominal pain, severe nausea/vomiting unless otherwise stated above. Pertinent History Reviewed:  Reviewed past medical,surgical, social, obstetrical and family history.  Reviewed problem list, medications and allergies. Physical Assessment:   Vitals:   12/19/16 0843  BP: (!) 100/52  Pulse: 90  Weight: 169 lb 8 oz (76.9 kg)  Body mass index is 29.09 kg/m.        Physical Examination:   General appearance: Well appearing, and in no distress  Mental status: Alert, oriented to person, place, and time  Skin: Warm & dry  Cardiovascular: Normal heart rate noted  Respiratory: Normal respiratory effort, no distress  Abdomen: Soft, gravid, nontender  Pelvic: spec exam, cx  visually closed, small amt white nonodorous d/c          Rectal: 3 non-thrombosed flacid hemorrhoids  Extremities: Edema: None  Fetal Status: Fetal Heart Rate (bpm): 144 Fundal Height: 31 cm Movement: Present    Results for orders placed or performed in visit on 12/19/16 (from the past 24 hour(s))  POCT urinalysis dipstick   Collection Time: 12/19/16  8:44 AM  Result Value Ref Range   Color, UA     Clarity, UA     Glucose, UA neg    Bilirubin, UA     Ketones, UA neg    Spec Grav, UA  1.010 - 1.025   Blood, UA neg    pH, UA  5.0 - 8.0   Protein, UA 1+    Urobilinogen, UA  0.2 or 1.0 E.U./dL   Nitrite, UA neg    Leukocytes, UA Trace (A) Negative  POCT hemoglobin   Collection Time: 12/19/16  8:48 AM  Result Value Ref Range   Hemoglobin 8.4 (A) 12.2 - 16.2 g/dL  POCT Wet Prep [facility])   Collection Time: 12/19/16  9:37 AM  Result Value Ref Range   Source Wet Prep POC vaginal    WBC, Wet Prep HPF POC few    Bacteria Wet Prep HPF POC None (A) Few   BACTERIA WET PREP MORPHOLOGY POC     Clue Cells Wet Prep HPF POC None None   Clue Cells Wet Prep Whiff POC Negative Whiff    Yeast Wet Prep HPF POC None    KOH Wet Prep POC  Trichomonas Wet Prep HPF POC Absent Absent    Assessment & Plan:  1) Low-risk pregnancy G4P3003 at [redacted]w[redacted]d with an Estimated Date of Delivery: 02/15/17   2) Constipation, discussed and gave printed prevention/relief measures. To start colace daily to bid, and miralax daily to bid  3) Hemorrhoids> continue otc creams prn, work on constipation  4) Anemia> Hgb today 8.4, to resume Fe BID, take w/ OJ, increase fe-rich foods. 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  All questions were answered  Follow-up: Return in about 2 weeks (around 01/02/2017) for LROB.  Orders Placed This Encounter  Procedures  . Urine Culture  . POCT urinalysis dipstick  . POCT hemoglobin  . POCT [employer] Prep [facility] [place])   Marge Duncans CNM, [facility] 12/19/2016 9:38 AM

## 2016-12-19 NOTE — Patient Instructions (Addendum)
[name], I greatly value your feedback.  If you receive a survey following your visit with [place] today, we appreciate you taking the time to fill it out.  Thanks, [name], CNM, WHNP-BC  Stop eating flour and ice. Start taking your iron pills, 1 pill twice daily with orange juice. Increase foods high in iron (red meats, green leafy vegetables, beans, cook in [employer])   Call the office [phone]) or go to [facility] if:  You begin to have strong, frequent contractions  Your water breaks.  Sometimes it is a big gush of fluid, sometimes it is just a trickle that keeps getting your panties wet or running down your legs  You have vaginal bleeding.  It is normal to have a small amount of spotting if your cervix was checked.   You don't feel your baby moving like normal.  If you don't, get you something to eat and drink and lay down and focus on feeling your baby move.  You should feel at least 10 movements in 2 hours.  If you don't, you should call the office or go to [facility].    Tdap Vaccine  It is recommended that you get the Tdap vaccine during the third trimester of EACH pregnancy to help protect your baby from getting pertussis (whooping cough)  27-36 weeks is the BEST time to do this so that you can pass the protection on to your baby. During pregnancy is better than after pregnancy, but if you are unable to get it during pregnancy it will be offered at the hospital.   You can get this vaccine at the health department or your family doctor  Everyone who will be around your baby should also be up-to-date on their vaccines. Adults (who are not pregnant) only need 1 dose of Tdap during adulthood.   For your lower back pain you may:  Purchase a pregnancy belt from [employer] [place], [employer], [employer], etc and wear it while you are up and about  Take warm baths  Use a heating pad to your lower back for no longer than 20 minutes at a time, and do not  place near abdomen  Take tylenol as needed. Please follow directions on the bottle  Kinesthesiology tape (can get from sporting goods store), google how to tape belly for pregnancy   Constipation  Drink plenty of fluid, preferably water, throughout the day  Eat foods high in fiber such as fruits, vegetables, and grains  Exercise, such as walking, is a good way to keep your bowels regular  Drink warm fluids, especially warm prune juice, or decaf coffee  Eat a 1/2 cup of real oatmeal (not instant), 1/2 cup applesauce, and 1/2-1 cup warm prune juice every day  If needed, you may take Colace (docusate sodium) stool softener once or twice a day to help keep the stool soft. If you are pregnant, wait until you are out of your first trimester (12-14 weeks of pregnancy)  If you still are having problems with constipation, you may take Miralax once daily as needed to help keep your bowels regular.  If you are pregnant, wait until you are out of your first trimester (12-14 weeks of pregnancy)       Third Trimester of Pregnancy The third trimester is from week 29 through week 42, months 7 through 9. The third trimester is a time when the fetus is growing rapidly. At the end of the ninth month, the fetus is  about 20 inches in length and weighs 6-10 pounds.  BODY CHANGES Your body goes through many changes during pregnancy. The changes vary from woman to woman.   Your weight will continue to increase. You can expect to gain 25-35 pounds (11-16 kg) by the end of the pregnancy.  You may begin to get stretch marks on your hips, abdomen, and breasts.  You may urinate more often because the fetus is moving lower into your pelvis and pressing on your bladder.  You may develop or continue to have heartburn as a result of your pregnancy.  You may develop constipation because certain hormones are causing the muscles that push waste through your intestines to slow down.  You may develop hemorrhoids  or swollen, bulging veins (varicose veins).  You may have pelvic pain because of the weight gain and pregnancy hormones relaxing your joints between the bones in your pelvis. Backaches may result from overexertion of the muscles supporting your posture.  You may have changes in your hair. These can include thickening of your hair, rapid growth, and changes in texture. Some women also have hair loss during or after pregnancy, or hair that feels dry or thin. Your hair will most likely return to normal after your baby is born.  Your breasts will continue to grow and be tender. A yellow discharge may leak from your breasts called colostrum.  Your belly button may stick out.  You may feel short of breath because of your expanding uterus.  You may notice the fetus "dropping," or moving lower in your abdomen.  You may have a bloody mucus discharge. This usually occurs a few days to a week before labor begins.  Your cervix becomes thin and soft (effaced) near your due date. WHAT TO EXPECT AT YOUR PRENATAL EXAMS  You will have prenatal exams every 2 weeks until week 36. Then, you will have weekly prenatal exams. During a routine prenatal visit:  You will be weighed to make sure you and the fetus are growing normally.  Your blood pressure is taken.  Your abdomen will be measured to track your baby's growth.  The fetal heartbeat will be listened to.  Any test results from the previous visit will be discussed.  You may have a cervical check near your due date to see if you have effaced. At around 36 weeks, your caregiver will check your cervix. At the same time, your caregiver will also perform a test on the secretions of the vaginal tissue. This test is to determine if a type of bacteria, Group B streptococcus, is present. Your caregiver will explain this further. Your caregiver may ask you:  What your birth plan is.  How you are feeling.  If you are feeling the baby move.  If you have  had any abnormal symptoms, such as leaking fluid, bleeding, severe headaches, or abdominal cramping.  If you have any questions. Other tests or screenings that may be performed during your third trimester include:  Blood tests that check for low iron levels (anemia).  Fetal testing to check the health, activity level, and growth of the fetus. Testing is done if you have certain medical conditions or if there are problems during the pregnancy. FALSE LABOR You may feel small, irregular contractions that eventually go away. These are called Braxton Hicks contractions, or false labor. Contractions may last for hours, days, or even weeks before true labor sets in. If contractions come at regular intervals, intensify, or become painful, it is best  to be seen by your caregiver.  SIGNS OF LABOR   Menstrual-like cramps.  Contractions that are 5 minutes apart or less.  Contractions that start on the top of the uterus and spread down to the lower abdomen and back.  A sense of increased pelvic pressure or back pain.  A watery or bloody mucus discharge that comes from the vagina. If you have any of these signs before the 37th week of pregnancy, call your caregiver right away. You need to go to the hospital to get checked immediately. HOME CARE INSTRUCTIONS   Avoid all smoking, herbs, alcohol, and unprescribed drugs. These chemicals affect the formation and growth of the baby.  Follow your caregiver's instructions regarding medicine use. There are medicines that are either safe or unsafe to take during pregnancy.  Exercise only as directed by your caregiver. Experiencing uterine cramps is a good sign to stop exercising.  Continue to eat regular, healthy meals.  Wear a good support bra for breast tenderness.  Do not use hot tubs, steam rooms, or saunas.  Wear your seat belt at all times when driving.  Avoid raw meat, uncooked cheese, cat litter boxes, and soil used by cats. These carry germs  that can cause birth defects in the baby.  Take your prenatal vitamins.  Try taking a stool softener (if your caregiver approves) if you develop constipation. Eat more high-fiber foods, such as fresh vegetables or fruit and whole grains. Drink plenty of fluids to keep your urine clear or pale yellow.  Take warm sitz baths to soothe any pain or discomfort caused by hemorrhoids. Use hemorrhoid cream if your caregiver approves.  If you develop varicose veins, wear support hose. Elevate your feet for 15 minutes, 3-4 times a day. Limit salt in your diet.  Avoid heavy lifting, wear low heal shoes, and practice good posture.  Rest a lot with your legs elevated if you have leg cramps or low back pain.  Visit your dentist if you have not gone during your pregnancy. Use a soft toothbrush to brush your teeth and be gentle when you floss.  A sexual relationship may be continued unless your caregiver directs you otherwise.  Do not travel far distances unless it is absolutely necessary and only with the approval of your caregiver.  Take prenatal classes to understand, practice, and ask questions about the labor and delivery.  Make a trial run to the hospital.  Pack your hospital bag.  Prepare the baby's nursery.  Continue to go to all your prenatal visits as directed by your caregiver. SEEK MEDICAL CARE IF:  You are unsure if you are in labor or if your water has broken.  You have dizziness.  You have mild pelvic cramps, pelvic pressure, or nagging pain in your abdominal area.  You have persistent nausea, vomiting, or diarrhea.  You have a bad smelling vaginal discharge.  You have pain with urination. SEEK IMMEDIATE MEDICAL CARE IF:   You have a fever.  You are leaking fluid from your vagina.  You have spotting or bleeding from your vagina.  You have severe abdominal cramping or pain.  You have rapid weight loss or gain.  You have shortness of breath with chest pain.  You  notice sudden or extreme swelling of your face, hands, ankles, feet, or legs.  You have not felt your baby move in over an hour.  You have severe headaches that do not go away with medicine.  You have vision changes. Document Released:  02/04/2001 Document Revised: 02/15/2013 Document Reviewed: 04/13/2012 [facility] MeetingExitCare Patient Information 2015 [place], [place]. This information is not intended to replace advice given to you by your health care provider. Make sure you discuss any questions you have with your health care provider.   Sciatica Rehab Ask your health care provider which exercises are safe for you. Do exercises exactly as told by your health care provider and adjust them as directed. It is normal to feel mild stretching, pulling, tightness, or discomfort as you do these exercises, but you should stop right away if you feel sudden pain or your pain gets worse.Do not begin these exercises until told by your health care provider. Stretching and range of motion exercises These exercises warm up your muscles and joints and improve the movement and flexibility of your hips and your back. These exercises also help to relieve pain, numbness, and tingling. Exercise A: Sciatic nerve glide 8. Sit in a chair with your head facing down toward your chest. Place your hands behind your back. Let your shoulders slump forward. 9. Slowly straighten one of your knees while you tilt your head back as if you are looking toward the ceiling. Only straighten your leg as far as you can without making your symptoms worse. 10. Hold for __________ seconds. 11. Slowly return to the starting position. 12. Repeat with your other leg. Repeat __________ times. Complete this exercise __________ times a day. Exercise B: Knee to chest with hip adduction and internal rotation  1. Lie on your back on a firm surface with both legs straight. 2. Bend one of your knees and move it up toward your chest until you feel a gentle  stretch in your lower back and buttock. Then, move your knee toward the shoulder that is on the opposite side from your leg. ? Hold your leg in this position by holding onto the front of your knee. 3. Hold for __________ seconds. 4. Slowly return to the starting position. 5. Repeat with your other leg. Repeat __________ times. Complete this exercise __________ times a day. Exercise C: Prone extension on elbows  1. Lie on your abdomen on a firm surface. A bed may be too soft for this exercise. 2. Prop yourself up on your elbows. 3. Use your arms to help lift your chest up until you feel a gentle stretch in your abdomen and your lower back. ? This will place some of your body weight on your elbows. If this is uncomfortable, try stacking pillows under your chest. ? Your hips should stay down, against the surface that you are lying on. Keep your hip and back muscles relaxed. 4. Hold for __________ seconds. 5. Slowly relax your upper body and return to the starting position. Repeat __________ times. Complete this exercise __________ times a day. Strengthening exercises These exercises build strength and endurance in your back. Endurance is the ability to use your muscles for a long time, even after they get tired. Exercise D: Pelvic tilt 1. Lie on your back on a firm surface. Bend your knees and keep your feet flat. 2. Tense your abdominal muscles. Tip your pelvis up toward the ceiling and flatten your lower back into the floor. ? To help with this exercise, you may place a small towel under your lower back and try to push your back into the towel. 3. Hold for __________ seconds. 4. Let your muscles relax completely before you repeat this exercise. Repeat __________ times. Complete this exercise __________ times a day. Exercise E:  Alternating arm and leg raises  1. Get on your hands and knees on a firm surface. If you are on a hard floor, you may want to use padding to cushion your knees, such  as an exercise mat. 2. Line up your arms and legs. Your hands should be below your shoulders, and your knees should be below your hips. 3. Lift your left leg behind you. At the same time, raise your right arm and straighten it in front of you. ? Do not lift your leg higher than your hip. ? Do not lift your arm higher than your shoulder. ? Keep your abdominal and back muscles tight. ? Keep your hips facing the ground. ? Do not arch your back. ? Keep your balance carefully, and do not hold your breath. 4. Hold for __________ seconds. 5. Slowly return to the starting position and repeat with your right leg and your left arm. Repeat __________ times. Complete this exercise __________ times a day. Posture and body mechanics  Body mechanics refers to the movements and positions of your body while you do your daily activities. Posture is part of body mechanics. Good posture and healthy body mechanics can help to relieve stress in your body's tissues and joints. Good posture means that your spine is in its natural S-curve position (your spine is neutral), your shoulders are pulled back slightly, and your head is not tipped forward. The following are general guidelines for applying improved posture and body mechanics to your everyday activities. Standing   When standing, keep your spine neutral and your feet about hip-width apart. Keep a slight bend in your knees. Your ears, shoulders, and hips should line up.  When you do a task in which you stand in one place for a long time, place one foot up on a stable object that is 2-4 inches (5-10 cm) high, such as a footstool. This helps keep your spine neutral. Sitting   When sitting, keep your spine neutral and keep your feet flat on the floor. Use a footrest, if necessary, and keep your thighs parallel to the floor. Avoid rounding your shoulders, and avoid tilting your head forward.  When working at a desk or a computer, keep your desk at a height where  your hands are slightly lower than your elbows. Slide your chair under your desk so you are close enough to maintain good posture.  When working at a computer, place your monitor at a height where you are looking straight ahead and you do not have to tilt your head forward or downward to look at the screen. Resting   When lying down and resting, avoid positions that are most painful for you.  If you have pain with activities such as sitting, bending, stooping, or squatting (flexion-based activities), lie in a position in which your body does not bend very much. For example, avoid curling up on your side with your arms and knees near your chest (fetal position).  If you have pain with activities such as standing for a long time or reaching with your arms (extension-based activities), lie with your spine in a neutral position and bend your knees slightly. Try the following positions: ? Lying on your side with a pillow between your knees. ? Lying on your back with a pillow under your knees. Lifting   When lifting objects, keep your feet at least shoulder-width apart and tighten your abdominal muscles.  Bend your knees and hips and keep your spine neutral. It is important  to lift using the strength of your legs, not your back. Do not lock your knees straight out.  Always ask for help to lift heavy or awkward objects. This information is not intended to replace advice given to you by your health care provider. Make sure you discuss any questions you have with your health care provider. Document Released: 02/10/2005 Document Revised: 10/18/2015 Document Reviewed: 10/27/2014 [employer] Interactive Patient Education  [employer].

## 2016-12-21 LAB — URINE CULTURE

## 2017-01-01 ENCOUNTER — Encounter: Payer: Self-pay | Admitting: Obstetrics & Gynecology

## 2017-01-02 ENCOUNTER — Ambulatory Visit (INDEPENDENT_AMBULATORY_CARE_PROVIDER_SITE_OTHER): Payer: Medicaid Other | Admitting: Women's Health

## 2017-01-02 ENCOUNTER — Encounter: Payer: Self-pay | Admitting: Women's Health

## 2017-01-02 VITALS — BP 102/54 | HR 88 | Wt 173.0 lb

## 2017-01-02 DIAGNOSIS — Z1389 Encounter for screening for other disorder: Secondary | ICD-10-CM

## 2017-01-02 DIAGNOSIS — Z23 Encounter for immunization: Secondary | ICD-10-CM | POA: Diagnosis not present

## 2017-01-02 DIAGNOSIS — Z308 Encounter for other contraceptive management: Secondary | ICD-10-CM

## 2017-01-02 DIAGNOSIS — Z3483 Encounter for supervision of other normal pregnancy, third trimester: Secondary | ICD-10-CM

## 2017-01-02 DIAGNOSIS — Z331 Pregnant state, incidental: Secondary | ICD-10-CM

## 2017-01-02 DIAGNOSIS — Z3A33 33 weeks gestation of pregnancy: Secondary | ICD-10-CM

## 2017-01-02 LAB — POCT URINALYSIS DIPSTICK
GLUCOSE UA: NEGATIVE
Ketones, UA: NEGATIVE
Nitrite, UA: NEGATIVE
RBC UA: NEGATIVE

## 2017-01-02 NOTE — Patient Instructions (Addendum)
[name], I greatly value your feedback.  If you receive a survey following your visit with [place] today, we appreciate you taking the time to fill it out.  Thanks, Knute Neu, CNM, WHNP-BC   Call the office [phone]) or go to [facility] if:  You begin to have strong, frequent contractions  Your water breaks.  Sometimes it is a big gush of fluid, sometimes it is just a trickle that keeps getting your panties wet or running down your legs  You have vaginal bleeding.  It is normal to have a small amount of spotting if your cervix was checked.   You don't feel your baby moving like normal.  If you don't, get you something to eat and drink and lay down and focus on feeling your baby move.  You should feel at least 10 movements in 2 hours.  If you don't, you should call the office or go to [place] your lower back pain you may:  Purchase a pregnancy belt from [employer] [place], [employer], [employer], etc and wear it while you are up and about  Take warm baths  Use a heating pad to your lower back for no longer than 20 minutes at a time, and do not place near abdomen  Take tylenol as needed. Please follow directions on the bottle  Kinesiology tape (can get from sporting goods store), google how to tape belly for pregnancy  Tdap Vaccine  It is recommended that you get the Tdap vaccine during the third trimester of EACH pregnancy to help protect your baby from getting pertussis (whooping cough)  27-36 weeks is the BEST time to do this so that you can pass the protection on to your baby. During pregnancy is better than after pregnancy, but if you are unable to get it during pregnancy it will be offered at the hospital.   You can get this vaccine at the health department or your family doctor  Everyone who will be around your baby should also be up-to-date on their vaccines. Adults (who are not pregnant) only need 1 dose of Tdap during adulthood.           Preterm Labor and Birth Information The normal length of a pregnancy is 39-41 weeks. Preterm labor is when labor starts before 37 completed weeks of pregnancy. What are the risk factors for preterm labor? Preterm labor is more likely to occur in women who:  Have certain infections during pregnancy such as a bladder infection, sexually transmitted infection, or infection inside the uterus (chorioamnionitis).  Have a shorter-than-normal cervix.  Have gone into preterm labor before.  Have had surgery on their cervix.  Are younger than age 30 or older than age 43.  Are African American.  Are pregnant with twins or multiple babies (multiple gestation).  Take street drugs or smoke while pregnant.  Do not gain enough weight while pregnant.  Became pregnant shortly after having been pregnant.  What are the symptoms of preterm labor? Symptoms of preterm labor include:  Cramps similar to those that can happen during a menstrual period. The cramps may happen with diarrhea.  Pain in the abdomen or lower back.  Regular uterine contractions that may feel like tightening of the abdomen.  A feeling of increased pressure in the pelvis.  Increased watery or bloody mucus discharge from the vagina.  Water breaking (ruptured amniotic sac).  Why is it important to recognize signs of preterm labor? It is important to  recognize signs of preterm labor because babies who are born prematurely may not be fully developed. This can put them at an increased risk for:  Long-term (chronic) heart and lung problems.  Difficulty immediately after birth with regulating body systems, including blood sugar, body temperature, heart rate, and breathing rate.  Bleeding in the brain.  Cerebral palsy.  Learning difficulties.  Death.  These risks are highest for babies who are born before 2 weeks of pregnancy. How is preterm labor treated? Treatment depends on the length of your pregnancy, your  condition, and the health of your baby. It may involve:  Having a stitch (suture) placed in your cervix to prevent your cervix from opening too early (cerclage).  Taking or being given medicines, such as: ? Hormone medicines. These may be given early in pregnancy to help support the pregnancy. ? Medicine to stop contractions. ? Medicines to help mature the baby's lungs. These may be prescribed if the risk of delivery is high. ? Medicines to prevent your baby from developing cerebral palsy.  If the labor happens before 34 weeks of pregnancy, you may need to stay in the hospital. What should I do if I think I am in preterm labor? If you think that you are going into preterm labor, call your health care provider right away. How can I prevent preterm labor in future pregnancies? To increase your chance of having a full-term pregnancy:  Do not use any tobacco products, such as cigarettes, chewing tobacco, and e-cigarettes. If you need help quitting, ask your health care provider.  Do not use street drugs or medicines that have not been prescribed to you during your pregnancy.  Talk with your health care provider before taking any herbal supplements, even if you have been taking them regularly.  Make sure you gain a healthy amount of weight during your pregnancy.  Watch for infection. If you think that you might have an infection, get it checked right away.  Make sure to tell your health care provider if you have gone into preterm labor before.  This information is not intended to replace advice given to you by your health care provider. Make sure you discuss any questions you have with your health care provider. Document Released: 05/03/2003 Document Revised: 07/24/2015 Document Reviewed: 07/04/2015 Elsevier Interactive Patient Education  2018 [employer].   Iron-Rich Diet Iron is a mineral that helps your body to produce hemoglobin. Hemoglobin is a protein in your red blood cells that  carries oxygen to your body's tissues. Eating too little iron may cause you to feel weak and tired, and it can increase your risk for infection. Eating enough iron is necessary for your body's metabolism, muscle function, and nervous system. Iron is naturally found in many foods. It can also be added to foods or fortified in foods. There are two types of dietary iron:  Heme iron. Heme iron is absorbed by the body more easily than nonheme iron. Heme iron is found in meat, poultry, and fish.  Nonheme iron. Nonheme iron is found in dietary supplements, iron-fortified grains, beans, and vegetables.  You may need to follow an iron-rich diet if:  You have been diagnosed with iron deficiency or iron-deficiency anemia.  You have a condition that prevents you from absorbing dietary iron, such as: ? Infection in your intestines. ? Celiac disease. This involves long-lasting (chronic) inflammation of your intestines.  You do not eat enough iron.  You eat a diet that is high in foods that  impair iron absorption.  You have lost a lot of blood.  You have heavy bleeding during your menstrual cycle.  You are pregnant.  What is my plan? Your health care provider may help you to determine how much iron you need per day based on your condition. Generally, when a person consumes sufficient amounts of iron in the diet, the following iron needs are met:  Men. ? 6-68 years old: 11 mg per day. ? 41-49 years old: 8 mg per day.  Women. ? 72-93 years old: 15 mg per day. ? 8-51 years old: 18 mg per day. ? Over 56 years old: 8 mg per day. ? Pregnant women: 27 mg per day. ? Breastfeeding women: 9 mg per day.  What do I need to know about an iron-rich diet?  Eat fresh fruits and vegetables that are high in vitamin C along with foods that are high in iron. This will help increase the amount of iron that your body absorbs from food, especially with foods containing nonheme iron. Foods that are high in  vitamin C include oranges, peppers, tomatoes, and mango.  Take iron supplements only as directed by your health care provider. Overdose of iron can be life-threatening. If you were prescribed iron supplements, take them with orange juice or a vitamin C supplement.  Cook foods in pots and pans that are made from iron.  Eat nonheme iron-containing foods alongside foods that are high in heme iron. This helps to improve your iron absorption.  Certain foods and drinks contain compounds that impair iron absorption. Avoid eating these foods in the same meal as iron-rich foods or with iron supplements. These include: ? Coffee, black tea, and red wine. ? Milk, dairy products, and foods that are high in calcium. ? Beans, soybeans, and peas. ? Whole grains.  When eating foods that contain both nonheme iron and compounds that impair iron absorption, follow these tips to absorb iron better. ? Soak beans overnight before cooking. ? Soak whole grains overnight and drain them before using. ? Ferment flours before baking, such as using yeast in bread dough. What foods can I eat? Grains Iron-fortified breakfast cereal. Iron-fortified whole-wheat bread. Enriched rice. Sprouted grains. Vegetables Spinach. Potatoes with skin. Green peas. Broccoli. Red and green bell peppers. Fermented vegetables. Fruits Prunes. Raisins. Oranges. Strawberries. Mango. Grapefruit. Meats and Other Protein Sources Beef liver. Oysters. Beef. Shrimp. [place]. Chicken. [place] [address]. Sardines. Chickpeas. Nuts. Tofu. Beverages Tomato juice. Fresh orange juice. Prune juice. Hibiscus tea. Fortified instant breakfast shakes. Condiments Tahini. Fermented soy sauce. Sweets and Desserts Black-strap molasses. Other Wheat germ. The items listed above may not be a complete list of recommended foods or beverages. Contact your dietitian for more options. What foods are not recommended? Grains Whole grains. Bran cereal. Bran flour.  Oats. Vegetables Artichokes. Brussels sprouts. Kale. Fruits Blueberries. Raspberries. Strawberries. Figs. Meats and Other Protein Sources Soybeans. Products made from soy protein. Dairy Milk. Cream. Cheese. Yogurt. Cottage cheese. Beverages Coffee. Black tea. Red wine. Sweets and Desserts Cocoa. Chocolate. Ice cream. Other Basil. Oregano. Parsley. The items listed above may not be a complete list of foods and beverages to avoid. Contact your dietitian for more information. This information is not intended to replace advice given to you by your health care provider. Make sure you discuss any questions you have with your health care provider. Document Released: 09/24/2004 Document Revised: 08/31/2015 Document Reviewed: 09/07/2013 Elsevier Interactive Patient Education  [employer].

## 2017-01-02 NOTE — Progress Notes (Signed)
LOW-RISK PREGNANCY VISIT Patient name: [name] MRN [id number]  Date of birth: [birth year] Chief Complaint:   Routine Prenatal Visit (c/o pressure/pain)  History of Present Illness:   [name] is a 31 y.o. G63P3003 female at [redacted]w[redacted]d with an Estimated Date of Delivery: 02/15/17 being seen today for ongoing management of a low-risk pregnancy.  Today she reports general aches/pains pelvis, low back, legs. Contractions: Not present. Vag. Bleeding: None.  Movement: Present. denies leaking of fluid. Review of Systems:   Pertinent items are noted in HPI Denies abnormal vaginal discharge w/ itching/odor/irritation, headaches, visual changes, shortness of breath, chest pain, abdominal pain, severe nausea/vomiting, or problems with urination or bowel movements unless otherwise stated above. Pertinent History Reviewed:  Reviewed past medical,surgical, social, obstetrical and family history.  Reviewed problem list, medications and allergies. Physical Assessment:   Vitals:   01/02/17 0918  BP: (!) 102/54  Pulse: 88  Weight: 173 lb (78.5 kg)  Body mass index is 29.7 kg/m.        Physical Examination:   General appearance: Well appearing, and in no distress  Mental status: Alert, oriented to person, place, and time  Skin: Warm & dry  Cardiovascular: Normal heart rate noted  Respiratory: Normal respiratory effort, no distress  Abdomen: Soft, gravid, nontender  Pelvic: Cervical exam deferred         Extremities: Edema: None  Fetal Status: Fetal Heart Rate (bpm): 145 Fundal Height: 32 cm Movement: Present    Results for orders placed or performed in visit on 01/02/17 (from the past 24 hour(s))  POCT urinalysis dipstick   Collection Time: 01/02/17  9:20 AM  Result Value Ref Range   Color, UA     Clarity, UA     Glucose, UA neg    Bilirubin, UA     Ketones, UA neg    Spec Grav, UA  1.010 - 1.025   Blood, UA neg    pH, UA  5.0 - 8.0   Protein, UA 1+    Urobilinogen, UA  0.2 or  1.0 E.U./dL   Nitrite, UA neg    Leukocytes, UA Trace (A) Negative    Assessment & Plan:  1) Low-risk pregnancy G4P3003 at [redacted]w[redacted]d with an Estimated Date of Delivery: 02/15/17   2) Pelvic/low back/leg pains, reviewed relief measures and gave printed info  Labs/procedures today: flu shot  Plan:  Continue routine obstetrical care   Reviewed: Preterm labor symptoms and general obstetric precautions including but not limited to vaginal bleeding, contractions, leaking of fluid and fetal movement were reviewed in detail with the patient.  Recommended Tdap at HD/PCP per CDC guidelines. All questions were answered  Follow-up: Return in about 2 weeks (around 01/16/2017) for LROB.  Orders Placed This Encounter  Procedures  . POCT urinalysis dipstick   Marge Duncans CNM, [facility] 01/02/2017 9:58 AM

## 2017-01-14 ENCOUNTER — Encounter: Payer: Medicaid Other | Admitting: Advanced Practice Midwife

## 2017-01-22 ENCOUNTER — Ambulatory Visit (INDEPENDENT_AMBULATORY_CARE_PROVIDER_SITE_OTHER): Payer: Medicaid Other | Admitting: Women's Health

## 2017-01-22 ENCOUNTER — Encounter: Payer: Self-pay | Admitting: Women's Health

## 2017-01-22 VITALS — BP 100/60 | HR 98 | Wt 177.0 lb

## 2017-01-22 DIAGNOSIS — Z1389 Encounter for screening for other disorder: Secondary | ICD-10-CM

## 2017-01-22 DIAGNOSIS — O99013 Anemia complicating pregnancy, third trimester: Secondary | ICD-10-CM | POA: Diagnosis not present

## 2017-01-22 DIAGNOSIS — Z331 Pregnant state, incidental: Secondary | ICD-10-CM

## 2017-01-22 DIAGNOSIS — Z3483 Encounter for supervision of other normal pregnancy, third trimester: Secondary | ICD-10-CM

## 2017-01-22 DIAGNOSIS — Z3A36 36 weeks gestation of pregnancy: Secondary | ICD-10-CM

## 2017-01-22 DIAGNOSIS — D649 Anemia, unspecified: Secondary | ICD-10-CM

## 2017-01-22 LAB — POCT URINALYSIS DIPSTICK
GLUCOSE UA: NEGATIVE
Ketones, UA: NEGATIVE
LEUKOCYTES UA: NEGATIVE
Nitrite, UA: NEGATIVE
PROTEIN UA: NEGATIVE
RBC UA: NEGATIVE

## 2017-01-22 LAB — POCT HEMOGLOBIN: HEMOGLOBIN: 10.1 g/dL — AB (ref 12.2–16.2)

## 2017-01-22 NOTE — Addendum Note (Signed)
Addended by: Federico FlakeNES, PEGGY A on: 01/22/2017 09:55 AM   Modules accepted: Orders

## 2017-01-22 NOTE — Progress Notes (Signed)
LOW-RISK PREGNANCY VISIT Patient name: [name] MRN [id number]  Date of birth: Dec 11, 1985 Chief Complaint:   Routine Prenatal Visit (GBS- GC- CHL)  History of Present Illness:   [name] is a 31 y.o. G30P3003 female at [redacted]w[redacted]d with an Estimated Date of Delivery: 02/15/17 being seen today for ongoing management of a low-risk pregnancy.  Today she reports no complaints. Contractions: Not present.  .  Movement: Present. denies leaking of fluid. Review of Systems:   Pertinent items are noted in HPI Denies abnormal vaginal discharge w/ itching/odor/irritation, headaches, visual changes, shortness of breath, chest pain, abdominal pain, severe nausea/vomiting, or problems with urination or bowel movements unless otherwise stated above. Pertinent History Reviewed:  Reviewed past medical,surgical, social, obstetrical and family history.  Reviewed problem list, medications and allergies. Physical Assessment:   Vitals:   01/22/17 0925  BP: 100/60  Pulse: 98  Weight: 177 lb (80.3 kg)  Body mass index is 30.38 kg/m.        Physical Examination:   General appearance: Well appearing, and in no distress  Mental status: Alert, oriented to person, place, and time  Skin: Warm & dry  Cardiovascular: Normal heart rate noted  Respiratory: Normal respiratory effort, no distress  Abdomen: Soft, gravid, nontender  Pelvic: Cervical exam performed  Dilation: 3 Effacement (%): 80 Station: -2  Extremities: Edema: None  Fetal Status: Fetal Heart Rate (bpm): 147 Fundal Height: 36 cm Movement: Present Presentation: Vertex  Results for orders placed or performed in visit on 01/22/17 (from the past 24 hour(s))  POCT urinalysis dipstick   Collection Time: 01/22/17  9:28 AM  Result Value Ref Range   Color, UA     Clarity, UA     Glucose, UA neg    Bilirubin, UA     Ketones, UA neg    Spec Grav, UA  1.010 - 1.025   Blood, UA neg    pH, UA  5.0 - 8.0   Protein, UA neg    Urobilinogen, UA  0.2 or  1.0 E.U./dL   Nitrite, UA neg    Leukocytes, UA Negative Negative    Hgb fingerstick: 10.1  Assessment & Plan:  1) Low-risk pregnancy G4P3003 at [redacted]w[redacted]d with an Estimated Date of Delivery: 02/15/17   2) Anemia, Hgb up to 10.1 now, continue Fe BID   Labs/procedures today: gbs, gc/ct, sve, fingerstick hgb  Plan:  Continue routine obstetrical care   Reviewed: Preterm labor symptoms and general obstetric precautions including but not limited to vaginal bleeding, contractions, leaking of fluid and fetal movement were reviewed in detail with the patient.  All questions were answered  Follow-up: Return in about 1 week (around 01/29/2017) for LROB.  Orders Placed This Encounter  Procedures  . GC/Chlamydia Probe Amp  . Strep Gp B NAA  . POCT urinalysis dipstick   Marge Duncans CNM, [facility] 01/22/2017 9:44 AM

## 2017-01-22 NOTE — Patient Instructions (Signed)
[name], I greatly value your feedback.  If you receive a survey following your visit with us today, we appreciate you taking the time to fill it out.  Thanks, Joellyn HaffKim Ashawnti Tangen, CNM, WHNP-BC   Call the office [phone]) or [facility] if:  You begin to have strong, frequent contractions  Your water breaks.  Sometimes it is a big gush of fluid, sometimes it is just a trickle that keeps getting your panties wet or running down your legs  You have vaginal bleeding.  It is normal to have a small amount of spotting if your cervix was checked.   You don't feel your baby moving like normal.  If you don't, get you something to eat and drink and lay down and focus on feeling your baby move.  You should feel at least 10 movements in 2 hours.  If you don't, you should call the office or [facility] to [facility].     [facility] Contractions of the uterus can occur throughout pregnancy, but they are not always a sign that you are in labor. You may have practice contractions called Braxton Hicks contractions. These false labor contractions are sometimes confused with true labor. What are Deberah PeltonBraxton Hicks contractions? Braxton Hicks contractions are tightening movements that occur in the muscles of the uterus before labor. Unlike true labor contractions, these contractions do not result in opening (dilation) and thinning of the cervix. Toward the end of pregnancy (32-34 weeks), Braxton Hicks contractions can happen more often and may become stronger. These contractions are sometimes difficult to tell apart from true labor because they can be very uncomfortable. You should not feel embarrassed if you go to the hospital with false labor. Sometimes, the only way to tell if you are in true labor is for your health care provider to look for changes in the cervix. The health care provider will do a physical exam and may monitor your contractions. If you are not in true labor, the exam should  show that your cervix is not dilating and your water has not broken. If there are no prenatal problems or other health problems associated with your pregnancy, it is completely safe for you to be sent home with false labor. You may continue to have Braxton Hicks contractions until you go into true labor. How can I tell the difference between true labor and false labor?  Differences ? False labor ? Contractions last 30-70 seconds.: Contractions are usually shorter and not as strong as true labor contractions. ? Contractions become very regular.: Contractions are usually irregular. ? Discomfort is usually felt in the top of the uterus, and it spreads to the lower abdomen and low back.: Contractions are often felt in the front of the lower abdomen and in the groin. ? Contractions do not go away with walking.: Contractions may go away when you walk around or change positions while lying down. ? Contractions usually become more intense and increase in frequency.: Contractions get weaker and are shorter-lasting as time goes on. ? The cervix dilates and gets thinner.: The cervix usually does not dilate or become thin. Follow these instructions at home:  Take over-the-counter and prescription medicines only as told by your health care provider.  Keep up with your usual exercises and follow other instructions from your health care provider.  Eat and drink lightly if you think you are going into labor.  If Braxton Hicks contractions are making you uncomfortable: ? Change your position from lying  down or resting to walking, or change from walking to resting. ? Sit and rest in a tub of warm water. ? Drink enough fluid to keep your urine clear or pale yellow. Dehydration may cause these contractions. ? Do slow and deep breathing several times an hour.  Keep all follow-up prenatal visits as told by your health care provider. This is important. Contact a health care provider if:  You have a  fever.  You have continuous pain in your abdomen. Get help right away if:  Your contractions become stronger, more regular, and closer together.  You have fluid leaking or gushing from your vagina.  You pass blood-tinged mucus (bloody show).  You have bleeding from your vagina.  You have low back pain that you never had before.  You feel your baby's head pushing down and causing pelvic pressure.  Your baby is not moving inside you as much as it used to. Summary  Contractions that occur before labor are called Braxton Hicks contractions, false labor, or practice contractions.  Braxton Hicks contractions are usually shorter, weaker, farther apart, and less regular than true labor contractions. True labor contractions usually become progressively stronger and regular and they become more frequent.  Manage discomfort from [facility] contractions by changing position, resting in a warm bath, drinking plenty of water, or practicing deep breathing. This information is not intended to replace advice given to you by your health care provider. Make sure you discuss any questions you have with your health care provider. Document Released: 02/10/2005 Document Revised: 12/31/2015 Document Reviewed: 12/31/2015 Elsevier Interactive Patient Education  2017 [employer].

## 2017-01-24 LAB — GC/CHLAMYDIA PROBE AMP
Chlamydia trachomatis, NAA: NEGATIVE
NEISSERIA GONORRHOEAE BY PCR: NEGATIVE

## 2017-01-24 LAB — STREP GP B NAA: Strep Gp B NAA: POSITIVE — AB

## 2017-01-29 ENCOUNTER — Encounter: Payer: Self-pay | Admitting: Advanced Practice Midwife

## 2017-01-29 ENCOUNTER — Ambulatory Visit (INDEPENDENT_AMBULATORY_CARE_PROVIDER_SITE_OTHER): Payer: Medicaid Other | Admitting: Advanced Practice Midwife

## 2017-01-29 VITALS — BP 118/78 | HR 80 | Wt 180.0 lb

## 2017-01-29 DIAGNOSIS — Z3A37 37 weeks gestation of pregnancy: Secondary | ICD-10-CM

## 2017-01-29 DIAGNOSIS — Z1389 Encounter for screening for other disorder: Secondary | ICD-10-CM

## 2017-01-29 DIAGNOSIS — Z331 Pregnant state, incidental: Secondary | ICD-10-CM

## 2017-01-29 DIAGNOSIS — Z3483 Encounter for supervision of other normal pregnancy, third trimester: Secondary | ICD-10-CM

## 2017-01-29 LAB — POCT URINALYSIS DIPSTICK
Glucose, UA: NEGATIVE
KETONES UA: NEGATIVE
Leukocytes, UA: NEGATIVE
Nitrite, UA: NEGATIVE
Protein, UA: NEGATIVE
RBC UA: NEGATIVE

## 2017-01-29 NOTE — Patient Instructions (Signed)

## 2017-01-29 NOTE — Progress Notes (Signed)
LOW-RISK PREGNANCY VISIT Patient name: [name] MRN [id number]  Date of birth: 1985/09/06 Chief Complaint:   Routine Prenatal Visit  History of Present Illness:   [name] is a 31 y.o. G71P3003 female at [redacted]w[redacted]d with an Estimated Date of Delivery: 02/15/17 being seen today for ongoing management of a low-risk pregnancy.  Today she reports no complaints. Contractions: Irregular. Vag. Bleeding: None.  Movement: Present. denies leaking of fluid. Review of Systems:   Pertinent items are noted in HPI Denies abnormal vaginal discharge w/ itching/odor/irritation, headaches, visual changes, shortness of breath, chest pain, abdominal pain, severe nausea/vomiting, or problems with urination or bowel movements unless otherwise stated above.  Pertinent History Reviewed:  Medical & Surgical Hx:   Past Medical History:  Diagnosis Date  . Anemia   . Blood transfusion without reported diagnosis   . IBS (irritable bowel syndrome)    with constipation  . Shoulder dislocation, recurrent    Past Surgical History:  Procedure Laterality Date  . WISDOM TOOTH EXTRACTION     Family History  Problem Relation Age of Onset  . Diabetes Sister   . Cancer Paternal Grandmother        breast  . Asthma Son   . Diabetes Maternal Aunt   . Cancer Paternal Aunt        cervical, breast  . Colon cancer Neg Hx     Current Outpatient Medications:  .  butalbital-acetaminophen-caffeine (FIORICET, ESGIC) 50-325-40 MG tablet, Take 1 tablet by mouth every 6 (six) hours as needed for headache., Disp: 20 tablet, Rfl: 0 .  Calcium Carbonate Antacid (TUMS PO), Take by mouth as needed., Disp: , Rfl:  .  ferrous sulfate 325 (65 FE) MG tablet, Take 1 tablet (325 mg total) by mouth 2 (two) times daily with a meal., Disp: 60 tablet, Rfl: 3 Social History: Reviewed -  reports that she quit smoking about 12 years ago. Her smoking use included cigarettes. she has never used smokeless tobacco.  Physical Assessment:    Vitals:   01/29/17 1031  BP: 118/78  Pulse: 80  Weight: 180 lb (81.6 kg)  Body mass index is 30.9 kg/m.        Physical Examination:   General appearance: Well appearing, and in no distress  Mental status: Alert, oriented to person, place, and time  Skin: Warm & dry  Cardiovascular: Normal heart rate noted  Respiratory: Normal respiratory effort, no distress  Abdomen: Soft, gravid, nontender  Pelvic: Cervical exam performed  Dilation: 4 Effacement (%): 70 Station: -2  Extremities: Edema: Trace  Fetal Status: Fetal Heart Rate (bpm): 137 Fundal Height: 37 cm Movement: Present Presentation: Vertex  Results for orders placed or performed in visit on 01/29/17 (from the past 24 hour(s))  POCT urinalysis dipstick   Collection Time: 01/29/17 10:35 AM  Result Value Ref Range   Color, UA     Clarity, UA     Glucose, UA neg    Bilirubin, UA     Ketones, UA neg    Spec Grav, UA  1.010 - 1.025   Blood, UA neg    pH, UA  5.0 - 8.0   Protein, UA neg    Urobilinogen, UA  0.2 or 1.0 E.U./dL   Nitrite, UA neg    Leukocytes, UA Negative Negative    Assessment & Plan:  1) Low-risk pregnancy G4P3003 at [redacted]w[redacted]d with an Estimated Date of Delivery: 02/15/17   2) ,    Labs/procedures/US today: doppler  Plan:  Continue routine obstetrical care    Follow-up: Return in about 1 week (around 02/05/2017) for LROB.  Orders Placed This Encounter  Procedures  . POCT urinalysis dipstick   CRESENZO-DISHMAN,Jadaya Sommerfield CNM 01/29/2017 10:55 AM

## 2017-02-03 ENCOUNTER — Encounter: Payer: Self-pay | Admitting: Advanced Practice Midwife

## 2017-02-05 ENCOUNTER — Encounter: Payer: Self-pay | Admitting: Advanced Practice Midwife

## 2017-02-05 ENCOUNTER — Ambulatory Visit (INDEPENDENT_AMBULATORY_CARE_PROVIDER_SITE_OTHER): Payer: Medicaid Other | Admitting: Advanced Practice Midwife

## 2017-02-05 VITALS — BP 116/72 | HR 97 | Wt 177.0 lb

## 2017-02-05 DIAGNOSIS — Z1389 Encounter for screening for other disorder: Secondary | ICD-10-CM

## 2017-02-05 DIAGNOSIS — J029 Acute pharyngitis, unspecified: Secondary | ICD-10-CM

## 2017-02-05 DIAGNOSIS — Z3A38 38 weeks gestation of pregnancy: Secondary | ICD-10-CM

## 2017-02-05 DIAGNOSIS — Z331 Pregnant state, incidental: Secondary | ICD-10-CM

## 2017-02-05 DIAGNOSIS — Z3483 Encounter for supervision of other normal pregnancy, third trimester: Secondary | ICD-10-CM

## 2017-02-05 LAB — POCT URINALYSIS DIPSTICK
Blood, UA: NEGATIVE
GLUCOSE UA: NEGATIVE
Ketones, UA: NEGATIVE
Nitrite, UA: NEGATIVE

## 2017-02-05 NOTE — Progress Notes (Signed)
[id number] 16106w4d Estimated Date of Delivery: 02/15/17  Blood pressure 116/72, pulse 97, weight 177 lb (80.3 kg), last menstrual period 05/04/2016.   BP weight and urine results all reviewed and noted.  Please refer to the obstetrical flow sheet for the fundal height and fetal heart rate documentation:  Patient reports good fetal movement, denies any bleeding and no rupture of membranes symptoms or regular contractions. Patient c/o itchy throat, worried she could get GBS im throat because she had oral sex w/her husband  Pt reassured, throat culture taken.  C/O [place]y dc for a few days . SSE: yeast All questions were answered.  Orders Placed This Encounter  Procedures  . POCT urinalysis dipstick    Plan:  Continued routine obstetrical care, want membranes strippednext week . OTC yeast med. Salt water gargles,  Return in about 6 days (around 02/11/2017) for LROB.

## 2017-02-08 LAB — CULTURE, GROUP A STREP: STREP A CULTURE: NEGATIVE

## 2017-02-09 ENCOUNTER — Encounter: Payer: Self-pay | Admitting: Advanced Practice Midwife

## 2017-02-10 ENCOUNTER — Encounter: Payer: Self-pay | Admitting: Advanced Practice Midwife

## 2017-02-11 ENCOUNTER — Ambulatory Visit (INDEPENDENT_AMBULATORY_CARE_PROVIDER_SITE_OTHER): Payer: Medicaid Other | Admitting: Advanced Practice Midwife

## 2017-02-11 ENCOUNTER — Encounter: Payer: Self-pay | Admitting: Advanced Practice Midwife

## 2017-02-11 VITALS — BP 120/70 | HR 93 | Wt 177.0 lb

## 2017-02-11 DIAGNOSIS — Z331 Pregnant state, incidental: Secondary | ICD-10-CM

## 2017-02-11 DIAGNOSIS — Z3483 Encounter for supervision of other normal pregnancy, third trimester: Secondary | ICD-10-CM

## 2017-02-11 DIAGNOSIS — O480 Post-term pregnancy: Secondary | ICD-10-CM

## 2017-02-11 DIAGNOSIS — Z1389 Encounter for screening for other disorder: Secondary | ICD-10-CM

## 2017-02-11 DIAGNOSIS — Z3A39 39 weeks gestation of pregnancy: Secondary | ICD-10-CM

## 2017-02-11 LAB — POCT URINALYSIS DIPSTICK
Blood, UA: NEGATIVE
GLUCOSE UA: NEGATIVE
KETONES UA: NEGATIVE
Leukocytes, UA: NEGATIVE
Nitrite, UA: NEGATIVE

## 2017-02-11 NOTE — Patient Instructions (Signed)

## 2017-02-11 NOTE — Progress Notes (Signed)
[id number] 7523w3d Estimated Date of Delivery: 02/15/17  Blood pressure 120/70, pulse 93, weight 177 lb (80.3 kg), last menstrual period 05/04/2016.   BP weight and urine results all reviewed and noted.  Please refer to the obstetrical flow sheet for the fundal height and fetal heart rate documentation:  Patient reports good fetal movement, denies any bleeding and no rupture of membranes symptoms or regular contractions. Patient is without complaints. All questions were answered.  Orders Placed This Encounter  Procedures  . US FETAL BPP WO NON STRESS  . POCT urinalysis dipstick    Plan:  Continued routine obstetrical care, membranes stripped  Return in about 7 days (around 02/18/2017) for LROB, US:BPP.

## 2017-02-12 ENCOUNTER — Encounter (HOSPITAL_COMMUNITY): Payer: Self-pay

## 2017-02-12 ENCOUNTER — Inpatient Hospital Stay (HOSPITAL_COMMUNITY)
Admission: AD | Admit: 2017-02-12 | Discharge: 2017-02-12 | Disposition: A | Discharge status: Home / Self Care | Payer: Medicaid Other | Source: Ambulatory Visit | Attending: Obstetrics and Gynecology | Admitting: Obstetrics and Gynecology

## 2017-02-12 ENCOUNTER — Other Ambulatory Visit: Payer: Self-pay

## 2017-02-12 DIAGNOSIS — O479 False labor, unspecified: Secondary | ICD-10-CM

## 2017-02-12 DIAGNOSIS — Z3483 Encounter for supervision of other normal pregnancy, third trimester: Secondary | ICD-10-CM | POA: Insufficient documentation

## 2017-02-12 DIAGNOSIS — Z3A40 40 weeks gestation of pregnancy: Secondary | ICD-10-CM | POA: Diagnosis not present

## 2017-02-12 NOTE — Discharge Instructions (Signed)
Braxton Hicks Contractions °Contractions of the uterus can occur throughout pregnancy, but they are not always a sign that you are in labor. You may have practice contractions called Braxton Hicks contractions. These false labor contractions are sometimes confused with true labor. °What are Braxton Hicks contractions? °Braxton Hicks contractions are tightening movements that occur in the muscles of the uterus before labor. Unlike true labor contractions, these contractions do not result in opening (dilation) and thinning of the cervix. Toward the end of pregnancy (32-34 weeks), Braxton Hicks contractions can happen more often and may become stronger. These contractions are sometimes difficult to tell apart from true labor because they can be very uncomfortable. You should not feel embarrassed if you go to the hospital with false labor. °Sometimes, the only way to tell if you are in true labor is for your health care provider to look for changes in the cervix. The health care provider will do a physical exam and may monitor your contractions. If you are not in true labor, the exam should show that your cervix is not dilating and your water has not broken. °If there are other health problems associated with your pregnancy, it is completely safe for you to be sent home with false labor. You may continue to have Braxton Hicks contractions until you go into true labor. °How to tell the difference between true labor and false labor °True labor °· Contractions last 30-70 seconds. °· Contractions become very regular. °· Discomfort is usually felt in the top of the uterus, and it spreads to the lower abdomen and low back. °· Contractions do not go away with walking. °· Contractions usually become more intense and increase in frequency. °· The cervix dilates and gets thinner. °False labor °· Contractions are usually shorter and not as strong as true labor contractions. °· Contractions are usually irregular. °· Contractions  are often felt in the front of the lower abdomen and in the groin. °· Contractions may go away when you walk around or change positions while lying down. °· Contractions get weaker and are shorter-lasting as time goes on. °· The cervix usually does not dilate or become thin. °Follow these instructions at home: °· Take over-the-counter and prescription medicines only as told by your health care provider. °· Keep up with your usual exercises and follow other instructions from your health care provider. °· Eat and drink lightly if you think you are going into labor. °· If Braxton Hicks contractions are making you uncomfortable: °? Change your position from lying down or resting to walking, or change from walking to resting. °? Sit and rest in a tub of warm water. °? Drink enough fluid to keep your urine pale yellow. Dehydration may cause these contractions. °? Do slow and deep breathing several times an hour. °· Keep all follow-up prenatal visits as told by your health care provider. This is important. °Contact a health care provider if: °· You have a fever. °· You have continuous pain in your abdomen. °Get help right away if: °· Your contractions become stronger, more regular, and closer together. °· You have fluid leaking or gushing from your vagina. °· You pass blood-tinged mucus (bloody show). °· You have bleeding from your vagina. °· You have low back pain that you never had before. °· You feel your baby’s head pushing down and causing pelvic pressure. °· Your baby is not moving inside you as much as it used to. °Summary °· Contractions that occur before labor are called Braxton   Hicks contractions, false labor, or practice contractions. °· Braxton Hicks contractions are usually shorter, weaker, farther apart, and less regular than true labor contractions. True labor contractions usually become progressively stronger and regular and they become more frequent. °· Manage discomfort from Braxton Hicks contractions by  changing position, resting in a warm bath, drinking plenty of water, or practicing deep breathing. °This information is not intended to replace advice given to you by your health care provider. Make sure you discuss any questions you have with your health care provider. °Document Released: 06/26/2016 Document Revised: 06/26/2016 Document Reviewed: 06/26/2016 °Elsevier Interactive Patient Education © 2018 Elsevier Inc[employer] ° °Fetal Movement Counts °Patient Name: ________________________________________________ Patient Due Date: ____________________ °What is a fetal movement count? °A fetal movement count is the number of times that you feel your baby move during a certain amount of time. This may also be called a fetal kick count. A fetal movement count is recommended for every pregnant woman. You may be asked to start counting fetal movements as early as week 28 of your pregnancy. °Pay attention to when your baby is most active. You may notice your baby's sleep and wake cycles. You may also notice things that make your baby move more. You should do a fetal movement count: °· When your baby is normally most active. °· At the same time each day. ° °A good time to count movements is while you are resting, after having something to eat and drink. °How do I count fetal movements? °1. Find a quiet, comfortable area. Sit, or lie down on your side. °2. Write down the date, the start time and stop time, and the number of movements that you felt between those two times. Take this information with you to your health care visits. °3. For 2 hours, count kicks, flutters, swishes, rolls, and jabs. You should feel at least 10 movements during 2 hours. °4. You may stop counting after you have felt 10 movements. °5. If you do not feel 10 movements in 2 hours, have something to eat and drink. Then, keep resting and counting for 1 hour. If you feel at least 4 movements during that hour, you may stop counting. °Contact a health care  provider if: °· You feel fewer than 4 movements in 2 hours. °· Your baby is not moving like he or she usually does. °Date: ____________ Start time: ____________ Stop time: ____________ Movements: ____________ °Date: ____________ Start time: ____________ Stop time: ____________ Movements: ____________ °Date: ____________ Start time: ____________ Stop time: ____________ Movements: ____________ °Date: ____________ Start time: ____________ Stop time: ____________ Movements: ____________ °Date: ____________ Start time: ____________ Stop time: ____________ Movements: ____________ °Date: ____________ Start time: ____________ Stop time: ____________ Movements: ____________ °Date: ____________ Start time: ____________ Stop time: ____________ Movements: ____________ °Date: ____________ Start time: ____________ Stop time: ____________ Movements: ____________ °Date: ____________ Start time: ____________ Stop time: ____________ Movements: ____________ °This information is not intended to replace advice given to you by your health care provider. Make sure you discuss any questions you have with your health care provider. °Document Released: 03/12/2006 Document Revised: 10/10/2015 Document Reviewed: 03/22/2015 °Elsevier Interactive Patient Education © 2018 Elsevier Inc. ° °

## 2017-02-12 NOTE — MAU Note (Signed)
Pt presents to MAU with c/o of ctx that started at 1000 today and lost mucous plug this morning. Pt denies vaginal and LOF. +FM

## 2017-02-12 NOTE — MAU Note (Signed)
I have communicated with Antony Odeaaroline Neil, CNM and reviewed vital signs:  Vitals:   02/12/17 1528  BP: 126/85  Pulse: (!) 108  Resp: 18  Temp: 97.8 F (36.6 C)    Vaginal exam:  Dilation: 5 Effacement (%): 70 Cervical Position: Middle Station: -3 Presentation: Vertex Exam by:: [place]ittany Myka Lukins, RN ,   Also reviewed contraction pattern and that non-stress test is reactive.  It has been documented that patient is contracting every 3-7 minutes with minimal cervical change over 1.5 hours not indicating active labor.  Patient denies any other complaints.  Based on this report provider has given order for discharge.  A discharge order and diagnosis entered by a provider.   Labor discharge instructions reviewed with patient.

## 2017-02-18 ENCOUNTER — Encounter: Payer: Self-pay | Admitting: Women's Health

## 2017-02-20 ENCOUNTER — Encounter: Payer: Self-pay | Admitting: Women's Health

## 2017-02-20 ENCOUNTER — Ambulatory Visit (INDEPENDENT_AMBULATORY_CARE_PROVIDER_SITE_OTHER): Payer: Medicaid Other

## 2017-02-20 ENCOUNTER — Ambulatory Visit (INDEPENDENT_AMBULATORY_CARE_PROVIDER_SITE_OTHER): Payer: Medicaid Other | Admitting: Women's Health

## 2017-02-20 ENCOUNTER — Telehealth (HOSPITAL_COMMUNITY): Payer: Self-pay | Admitting: *Deleted

## 2017-02-20 ENCOUNTER — Other Ambulatory Visit: Payer: Self-pay | Admitting: Advanced Practice Midwife

## 2017-02-20 VITALS — BP 118/64 | HR 80 | Wt 183.0 lb

## 2017-02-20 DIAGNOSIS — Z3483 Encounter for supervision of other normal pregnancy, third trimester: Secondary | ICD-10-CM

## 2017-02-20 DIAGNOSIS — O480 Post-term pregnancy: Secondary | ICD-10-CM | POA: Diagnosis not present

## 2017-02-20 DIAGNOSIS — Z3A40 40 weeks gestation of pregnancy: Secondary | ICD-10-CM | POA: Diagnosis not present

## 2017-02-20 DIAGNOSIS — F5089 Other specified eating disorder: Secondary | ICD-10-CM

## 2017-02-20 DIAGNOSIS — Z3403 Encounter for supervision of normal first pregnancy, third trimester: Secondary | ICD-10-CM

## 2017-02-20 DIAGNOSIS — Z1389 Encounter for screening for other disorder: Secondary | ICD-10-CM

## 2017-02-20 DIAGNOSIS — Z331 Pregnant state, incidental: Secondary | ICD-10-CM

## 2017-02-20 LAB — POCT URINALYSIS DIPSTICK
Blood, UA: NEGATIVE
Glucose, UA: NEGATIVE
Ketones, UA: NEGATIVE
LEUKOCYTES UA: NEGATIVE
NITRITE UA: NEGATIVE
PROTEIN UA: NEGATIVE

## 2017-02-20 NOTE — Patient Instructions (Addendum)
Corbin AdeShemeka T Figge, I greatly value your feedback.  If you receive a survey following your visit with us today, we appreciate you taking the time to fill it out.  Thanks, Joellyn HaffKim Nelton Amsden, CNM, WHNP-BC  Your induction is scheduled for 12/31 @ 7:00am. [facility] and let them know you are there to be induced. They will send someone from Labor & Delivery to come get you.      Call the office [phone]) or go to [facility] if:  You begin to have strong, frequent contractions  Your water breaks.  Sometimes it is a big gush of fluid, sometimes it is just a trickle that keeps getting your panties wet or running down your legs  You have vaginal bleeding.  It is normal to have a small amount of spotting if your cervix was checked.   You don't feel your baby moving like normal.  If you don't, get you something to eat and drink and lay down and focus on feeling your baby move.  You should feel at least 10 movements in 2 hours.  If you don't, you should call the office or [facility].    [facility] Contractions of the uterus can occur throughout pregnancy, but they are not always a sign that you are in labor. You may have practice contractions called Braxton Hicks contractions. These false labor contractions are sometimes confused with true labor. What are Deberah PeltonBraxton Hicks contractions? Braxton Hicks contractions are tightening movements that occur in the muscles of the uterus before labor. Unlike true labor contractions, these contractions do not result in opening (dilation) and thinning of the cervix. Toward the end of pregnancy (32-34 weeks), Braxton Hicks contractions can happen more often and may become stronger. These contractions are sometimes difficult to tell apart from true labor because they can be very uncomfortable. You should not feel embarrassed if you go to the hospital with false labor. Sometimes, the only way  to tell if you are in true labor is for your health care provider to look for changes in the cervix. The health care provider will do a physical exam and may monitor your contractions. If you are not in true labor, the exam should show that your cervix is not dilating and your water has not broken. If there are other health problems associated with your pregnancy, it is completely safe for you to be sent home with false labor. You may continue to have Braxton Hicks contractions until you go into true labor. How to tell the difference between true labor and false labor True labor  Contractions last 30-70 seconds.  Contractions become very regular.  Discomfort is usually felt in the top of the uterus, and it spreads to the lower abdomen and low back.  Contractions do not go away with walking.  Contractions usually become more intense and increase in frequency.  The cervix dilates and gets thinner. False labor  Contractions are usually shorter and not as strong as true labor contractions.  Contractions are usually irregular.  Contractions are often felt in the front of the lower abdomen and in the groin.  Contractions may go away when you walk around or change positions while lying down.  Contractions get weaker and are shorter-lasting as time goes on.  The cervix usually does not dilate or become thin. Follow these instructions at home:  Take over-the-counter and prescription medicines only as told by your health care provider.  Keep  up with your usual exercises and follow other instructions from your health care provider.  Eat and drink lightly if you think you are going into labor.  If Braxton Hicks contractions are making you uncomfortable: ? Change your position from lying down or resting to walking, or change from walking to resting. ? Sit and rest in a tub of warm water. ? Drink enough fluid to keep your urine pale yellow. Dehydration may cause these contractions. ? Do  slow and deep breathing several times an hour.  Keep all follow-up prenatal visits as told by your health care provider. This is important. Contact a health care provider if:  You have a fever.  You have continuous pain in your abdomen. Get help right away if:  Your contractions become stronger, more regular, and closer together.  You have fluid leaking or gushing from your vagina.  You pass blood-tinged mucus (bloody show).  You have bleeding from your vagina.  You have low back pain that you never had before.  You feel your baby's head pushing down and causing pelvic pressure.  Your baby is not moving inside you as much as it used to. Summary  Contractions that occur before labor are called Braxton Hicks contractions, false labor, or practice contractions.  Braxton Hicks contractions are usually shorter, weaker, farther apart, and less regular than true labor contractions. True labor contractions usually become progressively stronger and regular and they become more frequent.  Manage discomfort from [facility] contractions by changing position, resting in a warm bath, drinking plenty of water, or practicing deep breathing. This information is not intended to replace advice given to you by your health care provider. Make sure you discuss any questions you have with your health care provider. Document Released: 06/26/2016 Document Revised: 06/26/2016 Document Reviewed: 06/26/2016 Elsevier Interactive Patient Education  2018 [employer]

## 2017-02-20 NOTE — Treatment Plan (Signed)
Induction Assessment Scheduling Form Fax to [facility]:  [phone]  Corbin AdeShemeka T Taboada                                                                                   DOB:  08/30/1985                                                            MRN:  [id number]                                                                     Phone #:   [phone]                         Provider:  [facility]  GP:  [id number]                                                            Estimated Date of Delivery: 02/15/17  Dating Criteria: 8wk u/s    Medical Indications for induction:  postdates Admission Date/Time:  12/31 @ 0700 Gestational age on admission:  41.1   Filed Weights   02/20/17 1059  Weight: 183 lb (83 kg)   HIV:  Non Reactive (09/27 0914) GBS: Positive (11/29 1400)  5/70/-2   Method of induction(proposed):  Pit/arom   Scheduling Provider Signature:  Marge DuncansBooker, Ravinder Hofland Randall, CNM                                            Today's Date:  02/20/2017

## 2017-02-20 NOTE — Telephone Encounter (Signed)
Preadmission screen  

## 2017-02-20 NOTE — Progress Notes (Addendum)
LOW-RISK PREGNANCY VISIT Patient name: [name] MRN [id number]  Date of birth: [birth year] Chief Complaint:   Routine Prenatal Visit (u/s today)  History of Present Illness:   [name] is a 31 y.o. G29P3003 female at [redacted]w[redacted]d with an Estimated Date of Delivery: 02/15/17 being seen today for ongoing management of a low-risk pregnancy.  Today she reports no complaints. Contractions: Irregular. Vag. Bleeding: None.  Movement: Present. denies leaking of fluid. Review of Systems:   Pertinent items are noted in HPI Denies abnormal vaginal discharge w/ itching/odor/irritation, headaches, visual changes, shortness of breath, chest pain, abdominal pain, severe nausea/vomiting, or problems with urination or bowel movements unless otherwise stated above. Pertinent History Reviewed:  Reviewed past medical,surgical, social, obstetrical and family history.  Reviewed problem list, medications and allergies. Physical Assessment:   Vitals:   02/20/17 1059  BP: 118/64  Pulse: 80  Weight: 183 lb (83 kg)  Body mass index is 31.41 kg/m.        Physical Examination:   General appearance: Well appearing, and in no distress  Mental status: Alert, oriented to person, place, and time  Skin: Warm & dry  Cardiovascular: Normal heart rate noted  Respiratory: Normal respiratory effort, no distress  Abdomen: Soft, gravid, nontender  Pelvic: Cervical exam performed  Dilation: 5 Effacement (%): 70 Station: -2 Offered membrane sweeping, discussed r/b- pt decided to proceed, so membranes swept.   Extremities: Edema: Trace  Fetal Status: Fetal Heart Rate (bpm): 150 u/s Fundal Height: 39 cm Movement: Present Presentation: Vertex  [place] 40+5 wks,cephalic,BPP 8/8,afi 7.7 cm,fhr 150 bpm,bilat adnexa's wnl,anterior pl gr 3  Results for orders placed or performed in visit on 02/20/17 (from the past 24 hour(s))  POCT urinalysis dipstick   Collection Time: 02/20/17 11:01 AM  Result Value Ref Range   Color, UA     Clarity, UA     Glucose, UA neg    Bilirubin, UA     Ketones, UA neg    Spec Grav, UA  1.010 - 1.025   Blood, UA neg    pH, UA  5.0 - 8.0   Protein, UA neg    Urobilinogen, UA  0.2 or 1.0 E.U./dL   Nitrite, UA neg    Leukocytes, UA Negative Negative   Appearance     Odor      Assessment & Plan:  1) Low-risk pregnancy G4P3003 at [redacted]w[redacted]d with an Estimated Date of Delivery: 02/15/17   2) Postdates, bpp/afi normal today, scheduled for IOL for PD 12/31 @ 0700 if needed (12/30 was not available). IOL form faxed via Epic and orders placed   Labs/procedures today: u/s, sve membrane sweeping  Plan:  Continue routine obstetrical care   Reviewed: Term labor symptoms and general obstetric precautions including but not limited to vaginal bleeding, contractions, leaking of fluid and fetal movement were reviewed in detail with the patient.  All questions were answered  Follow-up: Return for 4-6wks for pp visit.  Orders Placed This Encounter  Procedures  . POCT urinalysis dipstick   Marge Duncans CNM, [facility] 02/20/2017 11:36 AM

## 2017-02-20 NOTE — Progress Notes (Signed)
US 40+5 wks,cephalic,BPP 8/8,afi 7.7 cm,fhr 150 bpm,bilat adnexa's wnl,

## 2017-02-20 NOTE — Addendum Note (Signed)
Addended by: Cheral MarkerBOOKER, Keelie Zemanek R on: 02/20/2017 03:11 PM   Modules accepted: Orders, SmartSet

## 2017-02-21 ENCOUNTER — Inpatient Hospital Stay (HOSPITAL_COMMUNITY): Payer: Medicaid Other | Admitting: Anesthesiology

## 2017-02-21 ENCOUNTER — Other Ambulatory Visit: Payer: Self-pay

## 2017-02-21 ENCOUNTER — Inpatient Hospital Stay (HOSPITAL_COMMUNITY)
Admission: AD | Admit: 2017-02-21 | Discharge: 2017-02-23 | DRG: 807 | Disposition: A | Discharge status: Home / Self Care | Payer: Medicaid Other | Source: Ambulatory Visit | Attending: Obstetrics & Gynecology | Admitting: Obstetrics & Gynecology

## 2017-02-21 ENCOUNTER — Encounter (HOSPITAL_COMMUNITY)
Admission: AD | Disposition: A | Discharge status: Home / Self Care | Payer: Self-pay | Source: Ambulatory Visit | Attending: Obstetrics & Gynecology

## 2017-02-21 ENCOUNTER — Encounter (HOSPITAL_COMMUNITY): Payer: Self-pay

## 2017-02-21 DIAGNOSIS — Z882 Allergy status to sulfonamides status: Secondary | ICD-10-CM

## 2017-02-21 DIAGNOSIS — Z3A41 41 weeks gestation of pregnancy: Secondary | ICD-10-CM

## 2017-02-21 DIAGNOSIS — E669 Obesity, unspecified: Secondary | ICD-10-CM | POA: Diagnosis present

## 2017-02-21 DIAGNOSIS — Z3A40 40 weeks gestation of pregnancy: Secondary | ICD-10-CM | POA: Diagnosis not present

## 2017-02-21 DIAGNOSIS — O9902 Anemia complicating childbirth: Secondary | ICD-10-CM | POA: Diagnosis present

## 2017-02-21 DIAGNOSIS — Z87891 Personal history of nicotine dependence: Secondary | ICD-10-CM | POA: Diagnosis not present

## 2017-02-21 DIAGNOSIS — D649 Anemia, unspecified: Secondary | ICD-10-CM | POA: Diagnosis present

## 2017-02-21 DIAGNOSIS — O99824 Streptococcus B carrier state complicating childbirth: Secondary | ICD-10-CM | POA: Diagnosis present

## 2017-02-21 DIAGNOSIS — O480 Post-term pregnancy: Secondary | ICD-10-CM | POA: Diagnosis present

## 2017-02-21 DIAGNOSIS — F5089 Other specified eating disorder: Secondary | ICD-10-CM

## 2017-02-21 DIAGNOSIS — Z3483 Encounter for supervision of other normal pregnancy, third trimester: Secondary | ICD-10-CM | POA: Diagnosis present

## 2017-02-21 DIAGNOSIS — O99214 Obesity complicating childbirth: Secondary | ICD-10-CM | POA: Diagnosis present

## 2017-02-21 HISTORY — PX: TUBAL LIGATION: SHX77

## 2017-02-21 LAB — ABO/RH: ABO/RH(D): O POS

## 2017-02-21 LAB — CBC
HEMATOCRIT: 32.8 % — AB (ref 36.0–46.0)
HEMOGLOBIN: 10.2 g/dL — AB (ref 12.0–15.0)
MCH: 23.2 pg — AB (ref 26.0–34.0)
MCHC: 31.1 g/dL (ref 30.0–36.0)
MCV: 74.5 fL — AB (ref 78.0–100.0)
Platelets: 233 10*3/uL (ref 150–400)
RBC: 4.4 MIL/uL (ref 3.87–5.11)
RDW: 20.6 % — ABNORMAL HIGH (ref 11.5–15.5)
WBC: 13.1 10*3/uL — ABNORMAL HIGH (ref 4.0–10.5)

## 2017-02-21 SURGERY — LIGATION, FALLOPIAN TUBE, POSTPARTUM
Anesthesia: Choice | Laterality: Bilateral

## 2017-02-21 MED ORDER — OXYTOCIN BOLUS FROM INFUSION: 500.0000 mL | Freq: Once | INTRAVENOUS | Status: DC

## 2017-02-21 MED ORDER — ACETAMINOPHEN 325 MG PO TABS
650.0000 mg | ORAL_TABLET | ORAL | Status: DC | PRN
  Administered 2017-02-22: 650 mg via ORAL
  Filled 2017-02-21: qty 2

## 2017-02-21 MED ORDER — COCONUT OIL OIL: 1.0000 "application " | TOPICAL_OIL | Status: DC | PRN

## 2017-02-21 MED ORDER — METOCLOPRAMIDE HCL 10 MG PO TABS
10.0000 mg | ORAL_TABLET | Freq: Once | ORAL | Status: AC
  Administered 2017-02-21: 10 mg via ORAL
  Filled 2017-02-21: qty 1

## 2017-02-21 MED ORDER — LACTATED RINGERS IV SOLN
INTRAVENOUS | Status: DC
  Administered 2017-02-21: 20 mL via INTRAVENOUS

## 2017-02-21 MED ORDER — OXYCODONE-ACETAMINOPHEN 5-325 MG PO TABS: 1.0000 | ORAL_TABLET | ORAL | Status: DC | PRN

## 2017-02-21 MED ORDER — FENTANYL CITRATE (PF) 100 MCG/2ML IJ SOLN: 50.0000 ug | INTRAMUSCULAR | Status: DC | PRN

## 2017-02-21 MED ORDER — HYDROXYZINE HCL 50 MG PO TABS
50.0000 mg | ORAL_TABLET | Freq: Four times a day (QID) | ORAL | Status: DC | PRN
  Filled 2017-02-21: qty 1

## 2017-02-21 MED ORDER — OXYTOCIN 10 UNIT/ML IJ SOLN
10.0000 [IU] | Freq: Once | INTRAMUSCULAR | Status: AC
  Administered 2017-02-21: 10 [IU] via INTRAMUSCULAR

## 2017-02-21 MED ORDER — SIMETHICONE 80 MG PO CHEW: 80.0000 mg | CHEWABLE_TABLET | ORAL | Status: DC | PRN

## 2017-02-21 MED ORDER — ONDANSETRON HCL 4 MG PO TABS: 4.0000 mg | ORAL_TABLET | ORAL | Status: DC | PRN

## 2017-02-21 MED ORDER — OXYCODONE-ACETAMINOPHEN 5-325 MG PO TABS
1.0000 | ORAL_TABLET | ORAL | Status: DC | PRN
Start: 2017-02-21 — End: 2017-02-23

## 2017-02-21 MED ORDER — PRENATAL MULTIVITAMIN CH
1.0000 | ORAL_TABLET | Freq: Every day | ORAL | Status: DC
  Administered 2017-02-22 – 2017-02-23 (×2): 1 via ORAL
  Filled 2017-02-21 (×2): qty 1

## 2017-02-21 MED ORDER — LACTATED RINGERS IV SOLN: 500.0000 mL | INTRAVENOUS | Status: DC | PRN

## 2017-02-21 MED ORDER — LACTATED RINGERS IV SOLN: INTRAVENOUS | Status: DC

## 2017-02-21 MED ORDER — ONDANSETRON HCL 4 MG/2ML IJ SOLN: 4.0000 mg | Freq: Four times a day (QID) | INTRAMUSCULAR | Status: DC | PRN

## 2017-02-21 MED ORDER — BENZOCAINE-MENTHOL 20-0.5 % EX AERO: 1.0000 "application " | INHALATION_SPRAY | CUTANEOUS | Status: DC | PRN

## 2017-02-21 MED ORDER — WITCH HAZEL-GLYCERIN EX PADS: 1.0000 "application " | MEDICATED_PAD | CUTANEOUS | Status: DC | PRN

## 2017-02-21 MED ORDER — OXYCODONE-ACETAMINOPHEN 5-325 MG PO TABS: 2.0000 | ORAL_TABLET | ORAL | Status: DC | PRN

## 2017-02-21 MED ORDER — OXYTOCIN 40 UNITS IN LACTATED RINGERS INFUSION - SIMPLE MED: 2.5000 [IU]/h | INTRAVENOUS | Status: DC

## 2017-02-21 MED ORDER — ZOLPIDEM TARTRATE 5 MG PO TABS: 5.0000 mg | ORAL_TABLET | Freq: Every evening | ORAL | Status: DC | PRN

## 2017-02-21 MED ORDER — DIBUCAINE 1 % RE OINT: 1.0000 "application " | TOPICAL_OINTMENT | RECTAL | Status: DC | PRN

## 2017-02-21 MED ORDER — SENNOSIDES-DOCUSATE SODIUM 8.6-50 MG PO TABS
2.0000 | ORAL_TABLET | ORAL | Status: DC
  Administered 2017-02-22 (×2): 2 via ORAL
  Filled 2017-02-21 (×2): qty 2

## 2017-02-21 MED ORDER — FLEET ENEMA 7-19 GM/118ML RE ENEM: 1.0000 | ENEMA | RECTAL | Status: DC | PRN

## 2017-02-21 MED ORDER — FAMOTIDINE 20 MG PO TABS
40.0000 mg | ORAL_TABLET | Freq: Once | ORAL | Status: AC
  Administered 2017-02-21: 40 mg via ORAL
  Filled 2017-02-21: qty 2

## 2017-02-21 MED ORDER — IBUPROFEN 600 MG PO TABS
600.0000 mg | ORAL_TABLET | Freq: Four times a day (QID) | ORAL | Status: DC
  Administered 2017-02-21 – 2017-02-23 (×9): 600 mg via ORAL
  Filled 2017-02-21 (×9): qty 1

## 2017-02-21 MED ORDER — BUPIVACAINE HCL (PF) 0.5 % IJ SOLN
INTRAMUSCULAR | Status: AC
  Filled 2017-02-21: qty 30

## 2017-02-21 MED ORDER — ACETAMINOPHEN 325 MG PO TABS
650.0000 mg | ORAL_TABLET | ORAL | Status: DC | PRN
  Administered 2017-02-21: 650 mg via ORAL
  Filled 2017-02-21: qty 2

## 2017-02-21 MED ORDER — SOD CITRATE-CITRIC ACID 500-334 MG/5ML PO SOLN: 30.0000 mL | ORAL | Status: DC | PRN

## 2017-02-21 MED ORDER — TETANUS-DIPHTH-ACELL PERTUSSIS 5-2.5-18.5 LF-MCG/0.5 IM SUSP: 0.5000 mL | Freq: Once | INTRAMUSCULAR | Status: DC

## 2017-02-21 MED ORDER — ONDANSETRON HCL 4 MG/2ML IJ SOLN: 4.0000 mg | INTRAMUSCULAR | Status: DC | PRN

## 2017-02-21 MED ORDER — LIDOCAINE HCL (PF) 1 % IJ SOLN
30.0000 mL | INTRAMUSCULAR | Status: DC | PRN
  Filled 2017-02-21: qty 30

## 2017-02-21 MED ORDER — DIPHENHYDRAMINE HCL 25 MG PO CAPS: 25.0000 mg | ORAL_CAPSULE | Freq: Four times a day (QID) | ORAL | Status: DC | PRN

## 2017-02-21 SURGICAL SUPPLY — 29 items
ADH SKN CLS APL DERMABOND .7 (GAUZE/BANDAGES/DRESSINGS) ×1
CLOTH BEACON ORANGE TIMEOUT ST (SAFETY) ×3 IMPLANT
CONTAINER PREFILL 10% NBF 15ML (MISCELLANEOUS) ×3 IMPLANT
DERMABOND ADVANCED (GAUZE/BANDAGES/DRESSINGS) ×2
DERMABOND ADVANCED .7 DNX12 (GAUZE/BANDAGES/DRESSINGS) ×1 IMPLANT
DRSG OPSITE POSTOP 3X4 (GAUZE/BANDAGES/DRESSINGS) ×3 IMPLANT
DURAPREP 26ML APPLICATOR (WOUND CARE) ×3 IMPLANT
ELECT REM PT RETURN 9FT ADLT (ELECTROSURGICAL) ×3
ELECTRODE REM PT RTRN 9FT ADLT (ELECTROSURGICAL) ×1 IMPLANT
GLOVE BIO SURGEON STRL SZ7 (GLOVE) ×3 IMPLANT
GLOVE BIOGEL PI IND STRL 7.0 (GLOVE) ×1 IMPLANT
GLOVE BIOGEL PI IND STRL 7.5 (GLOVE) ×1 IMPLANT
GLOVE BIOGEL PI INDICATOR 7.0 (GLOVE) ×2
GLOVE BIOGEL PI INDICATOR 7.5 (GLOVE) ×2
GOWN STRL REUS W/TWL LRG LVL3 (GOWN DISPOSABLE) ×6 IMPLANT
NEEDLE HYPO 22GX1.5 SAFETY (NEEDLE) ×3 IMPLANT
NS IRRIG 1000ML POUR BTL (IV SOLUTION) ×3 IMPLANT
PACK ABDOMINAL MINOR (CUSTOM PROCEDURE TRAY) ×3 IMPLANT
PENCIL BUTTON HOLSTER BLD 10FT (ELECTRODE) IMPLANT
PROTECTOR NERVE ULNAR (MISCELLANEOUS) ×3 IMPLANT
SPONGE LAP 4X18 X RAY DECT (DISPOSABLE) ×3 IMPLANT
SUT MNCRL AB 4-0 PS2 18 (SUTURE) ×3 IMPLANT
SUT PLAIN 2 0 (SUTURE)
SUT PLAIN ABS 2-0 CT1 27XMFL (SUTURE) IMPLANT
SUT VICRYL 0 TIES 12 18 (SUTURE) IMPLANT
SUT VICRYL 0 UR6 27IN ABS (SUTURE) ×3 IMPLANT
SYR CONTROL 10ML LL (SYRINGE) ×3 IMPLANT
TOWEL OR 17X24 6PK STRL BLUE (TOWEL DISPOSABLE) ×6 IMPLANT
TRAY FOLEY CATH SILVER 14FR (SET/KITS/TRAYS/PACK) ×3 IMPLANT

## 2017-02-21 NOTE — Lactation Note (Signed)
This note was copied from a baby's chart. Lactation Consultation Note  Patient Name: Girl De HollingsheadShemeka Plaisted ZOXWR'UToday's Date: 02/21/2017 Reason for consult: Initial assessment;Difficult latch  7 hours FT female who is being fully breastfed by her mother at this point. Mom intends to supplement with formula at some point though like she did with her other kids. She's aware about the benefits of breastfeeding and the risks of supplementing with formula. Baby was able to sustain latch with some assistance; but she needed to relatch frequently. Mom's nipples are large and intact. Reviewed breastfeeding resources, encouraged mom to schedule lactation OP visit is needed; and invited her to join the Breastfeeding support group.  Maternal Data Does the patient have breastfeeding experience prior to this delivery?: Yes  Feeding Feeding Type: Breast Fed Length of feed: 15 min  LATCH Score Latch: Repeated attempts needed to sustain latch, nipple held in mouth throughout feeding, stimulation needed to elicit sucking reflex.  Audible Swallowing: None  Type of Nipple: Everted at rest and after stimulation  Comfort (Breast/Nipple): Soft / non-tender  Hold (Positioning): Assistance needed to correctly position infant at breast and maintain latch.  LATCH Score: 6  Interventions Interventions: Breast feeding basics reviewed;Assisted with latch;Skin to skin;Breast massage;Adjust position;Support pillows  Lactation Tools Discussed/Used     Consult Status Consult Status: Follow-up Date: 02/22/17 Follow-up type: In-patient    Vishnu Moeller Venetia ConstableS Andreina Outten 02/21/2017, 1:43 PM

## 2017-02-21 NOTE — MAU Note (Signed)
Pt received by EMS with urge to push and crowning.

## 2017-02-21 NOTE — Progress Notes (Signed)
OR called and cancelled tubal ligation for today, patients diet changed to regular.

## 2017-02-21 NOTE — Progress Notes (Signed)
OB note D/w mom re: BTL. D/w her re: permanency, risk of regret and standard surgical risks; BTL papers are UTD. She'd like it done. Has been NPO since before MN and is PPD0. Will keep her NPO and d/w OR to see if can do today  Cornelia Copaharlie Domonik Levario, Jr MD Attending Center for [facility] (Faculty Practice) 02/21/2017 Time: [phone]

## 2017-02-21 NOTE — Progress Notes (Signed)
OR called to come and transport patient back to room, they have an urgent case and this patient had been moved to later. Patient transported back to room in bed.

## 2017-02-21 NOTE — Progress Notes (Signed)
OR ready for patient, transported in bed to the OR.

## 2017-02-21 NOTE — Anesthesia Preprocedure Evaluation (Deleted)
Anesthesia Evaluation  Patient identified by MRN, date of birth, ID band Patient awake    Reviewed: Allergy & Precautions, NPO status , Patient's Chart, lab work & pertinent test results  Airway Mallampati: II  TM Distance: >3 FB Neck ROM: Full    Dental  (+) Teeth Intact, Dental Advisory Given   Pulmonary former smoker,    Pulmonary exam normal breath sounds clear to auscultation       Cardiovascular negative cardio ROS Normal cardiovascular exam Rhythm:Regular Rate:Normal     Neuro/Psych PSYCHIATRIC DISORDERS ([place]) negative neurological ROS     GI/Hepatic negative GI ROS, Neg liver ROS,   Endo/Other  Obesity   Renal/GU negative Renal ROS     Musculoskeletal negative musculoskeletal ROS (+)   Abdominal   Peds  Hematology  (+) Blood dyscrasia, anemia , Plt 233k    Anesthesia Other Findings Day of surgery medications reviewed with the patient.  Reproductive/Obstetrics S/p SVD 02/21/17                             Anesthesia Physical Anesthesia Plan  ASA: II  Anesthesia Plan: Spinal   Post-op Pain Management:    Induction:   PONV Risk Score and Plan: 2 and Ondansetron and Dexamethasone  Airway Management Planned:   Additional Equipment:   Intra-op Plan:   Post-operative Plan:   Informed Consent: I have reviewed the patients History and Physical, chart, labs and discussed the procedure including the risks, benefits and alternatives for the proposed anesthesia with the patient or authorized representative who has indicated his/her understanding and acceptance.   Dental advisory given  Plan Discussed with: CRNA, Anesthesiologist and Surgeon  Anesthesia Plan Comments: (Discussed risks and benefits of and differences between spinal and general. Discussed risks of spinal including headache, backache, failure, bleeding, infection, and nerve damage. Patient consents to spinal.  Questions answered. Coagulation studies and platelet count acceptable.)        Anesthesia Quick Evaluation

## 2017-02-21 NOTE — H&P (Signed)
Obstetrics Admission History & Physical  02/21/2017 - 12:29 PM Primary OBGYN: [facility]  Chief Complaint: s/p vaginal delivery in OB triage  History of Present Illness  31 y.o. [id number] PPD#0 s/p SVD/1st (not repaired), with the above CC. Pregnancy complicated by: BMI 30s, GBS pos.  Ms. Corbin AdeShemeka T Yeomans came in via EMS and [facility]. Currently doing well and meeting all PP goals. D/w pt and she still desires BTL.   Review of Systems: as noted in the History of Present Illness  PMHx:  Past Medical History:  Diagnosis Date  . Anemia   . Blood transfusion without reported diagnosis   . IBS (irritable bowel syndrome)    with constipation  . Shoulder dislocation, recurrent    PSHx:  Past Surgical History:  Procedure Laterality Date  . WISDOM TOOTH EXTRACTION     Medications:  Medications Prior to Admission  Medication Sig Dispense Refill Last Dose  . ferrous sulfate 325 (65 FE) MG tablet Take 1 tablet (325 mg total) by mouth 2 (two) times daily with a meal. 60 tablet 3 Past Month at Unknown time     Allergies: is allergic to sulfa antibiotics. OBHx:  OB History  Gravida Para Term Preterm AB Living  4 4 4     4   SAB TAB Ectopic Multiple Live Births        0 4    # Outcome Date GA Lbr Len/2nd Weight Sex Delivery Anes PTL Lv  4 Term 02/21/17 3915w6d  7 lb 15.3 oz (3.609 kg) F Vag-Spont None  LIV  3 Term 12/02/08 5823w0d  7 lb 10 oz (3.459 kg) M Vag-Spont None N LIV  2 Term 08/18/06 3423w0d  7 lb 4 oz (3.289 kg) F Vag-Spont None Y LIV  1 Term 08/14/04 1323w0d  7 lb 9 oz (3.43 kg) M Vag-Spont EPI N LIV               FHx:  Family History  Problem Relation Age of Onset  . Diabetes Sister   . Cancer Paternal Grandmother        breast  . Asthma Son   . Diabetes Maternal Aunt   . Cancer Paternal Aunt        cervical, breast  . Colon cancer Neg Hx    Soc Hx:  Social History   Socioeconomic History  . Marital status: Married    Spouse name: Not  on file  . Number of children: Not on file  . Years of education: Not on file  . Highest education level: Not on file  Social Needs  . Financial resource strain: Not on file  . Food insecurity - worry: Not on file  . Food insecurity - inability: Not on file  . Transportation needs - medical: Not on file  . Transportation needs - non-medical: Not on file  Occupational History  . Not on file  Tobacco Use  . Smoking status: Former Smoker    Types: Cigarettes    Last attempt to quit: 07/22/2004    Years since quitting: 12.5  . Smokeless tobacco: Never Used  Substance and Sexual Activity  . Alcohol use: No    Comment: none since +UPT  . Drug use: No  . Sexual activity: Yes    Birth control/protection: None  Other Topics Concern  . Not on file  Social History Narrative  . Not on file    Objective    Current Vital Signs 24h Vital  Sign Ranges  T 98.3 F (36.8 C) Temp  Avg: 97.9 F (36.6 C)  Min: 97.6 F (36.4 C)  Max: 98.3 F (36.8 C)  BP (!) 119/53 BP  Min: 105/53  Max: 119/53  HR 61 Pulse  Avg: 69.6  Min: 61  Max: 75  RR 16 Resp  Avg: 17.5  Min: 16  Max: 18  SaO2 100 % Not Delivered SpO2  Avg: 100 %  Min: 100 %  Max: 100 %       24 Hour I/O Current Shift I/O  Time Ins Outs 12/28 0701 - 12/29 0700 In: -  Out: 250  No intake/output data recorded.    General: Well nourished, well developed female in no acute distress.  Skin:  Warm and dry.  Cardiovascular: S1, S2 normal, no murmur, rub or gallop, regular rate and rhythm Respiratory:  Clear to auscultation bilateral. Normal respiratory effort Abdomen: nttp, FF below the umbilicus Neuro/Psych:  Normal mood and affect.   Labs  Recent Labs  Lab 02/21/17 0732  WBC 13.1*  HGB 10.2*  HCT 32.8*  PLT 233    Radiology none  Perinatal info  O POS/ Rubella  Immune / Varicella Immune/RPR neg/HIV negative/HepB Surf Ag negative/TDaP:no /pap NILM/HPV neg/  Assessment & Plan   31 y.o. [id number] PPD#0 doing well *PP:  routine care. tdap ordered for prior to d/c.  *Contraception: see prior note. Pt still desires BTL. Posted for 1400 today *GBS: not treated due to precip delivery *Analgesia: no needs  Cornelia Copaharlie Rj Pedrosa, Jr. MD Attending Center for [facility](Faculty Practice)

## 2017-02-22 ENCOUNTER — Encounter (HOSPITAL_COMMUNITY): Payer: Self-pay | Admitting: Obstetrics and Gynecology

## 2017-02-22 LAB — RPR: RPR: NONREACTIVE

## 2017-02-22 NOTE — Discharge Instructions (Signed)
Postpartum Care After Vaginal Delivery °The period of time right after you deliver your newborn is called the postpartum period. °What kind of medical care will I receive? °· You may continue to receive fluids and medicines through an IV tube inserted into one of your veins. °· If an incision was made near your vagina (episiotomy) or if you had some vaginal tearing during delivery, cold compresses may be placed on your episiotomy or your tear. This helps to reduce pain and swelling. °· You may be given a squirt bottle to use when you go to the bathroom. You may use this until you are comfortable wiping as usual. To use the squirt bottle, follow these steps: °? Before you urinate, fill the squirt bottle with warm water. Do not use hot water. °? After you urinate, while you are sitting on the toilet, use the squirt bottle to rinse the area around your urethra and vaginal opening. This rinses away any urine and blood. °? You may do this instead of wiping. As you start healing, you may use the squirt bottle before wiping yourself. Make sure to wipe gently. °? Fill the squirt bottle with clean water every time you use the bathroom. °· You will be given sanitary pads to wear. °How can I expect to feel? °· You may not feel the need to urinate for several hours after delivery. °· You will have some soreness and pain in your abdomen and vagina. °· If you are breastfeeding, you may have uterine contractions every time you breastfeed for up to several weeks postpartum. Uterine contractions help your uterus return to its normal size. °· It is normal to have vaginal bleeding (lochia) after delivery. The amount and appearance of lochia is often similar to a menstrual period in the first week after delivery. It will gradually decrease over the next few weeks to a dry, yellow-brown discharge. For most women, lochia stops completely by 6-8 weeks after delivery. Vaginal bleeding can vary from woman to woman. °· Within the first few  days after delivery, you may have breast engorgement. This is when your breasts feel heavy, full, and uncomfortable. Your breasts may also throb and feel hard, tightly stretched, warm, and tender. After this occurs, you may have milk leaking from your breasts. Your health care provider can help you relieve discomfort due to breast engorgement. Breast engorgement should go away within a few days. °· You may feel more sad or worried than normal due to hormonal changes after delivery. These feelings should not last more than a few days. If these feelings do not go away after several days, speak with your health care provider. °How should I care for myself? °· Tell your health care provider if you have pain or discomfort. °· Drink enough water to keep your urine clear or pale yellow. °· Wash your hands thoroughly with soap and water for at least 20 seconds after changing your sanitary pads, after using the toilet, and before holding or feeding your baby. °· If you are not breastfeeding, avoid touching your breasts a lot. Doing this can make your breasts produce more milk. °· If you become weak or lightheaded, or you feel like you might faint, ask for help before: °? Getting out of bed. °? Showering. °· Change your sanitary pads frequently. Watch for any changes in your flow, such as a sudden increase in volume, a change in color, the passing of large blood clots. If you pass a blood clot from your vagina, save it   to show to your health care provider. Do not flush blood clots down the toilet without having your health care provider look at them.  Make sure that all your vaccinations are up to date. This can help protect you and your baby from getting certain diseases. You may need to have immunizations done before you leave the hospital.  If desired, talk with your health care provider about methods of family planning or birth control (contraception). How can I start bonding with my baby? Spending as much time as  possible with your baby is very important. During this time, you and your baby can get to know each other and develop a bond. Having your baby stay with you in your room (rooming in) can give you time to get to know your baby. Rooming in can also help you become comfortable caring for your baby. Breastfeeding can also help you bond with your baby. How can I plan for returning home with my baby?  Make sure that you have a car seat installed in your vehicle. ? Your car seat should be checked by a certified car seat installer to make sure that it is installed safely. ? Make sure that your baby fits into the car seat safely.  Ask your health care provider any questions you have about caring for yourself or your baby. Make sure that you are able to contact your health care provider with any questions after leaving the hospital. This information is not intended to replace advice given to you by your health care provider. Make sure you discuss any questions you have with your health care provider. Document Released: 12/08/2006 Document Revised: 07/16/2015 Document Reviewed: 01/15/2015 Elsevier Interactive Patient Education  2018 [employer]   Contraception Choices Contraception, also called birth control, means things to use or ways to try not to get pregnant. Hormonal birth control This kind of birth control uses hormones. Here are some types of hormonal birth control:  A tube that is put under skin of the arm (implant). The tube can stay in for as long as 3 years.  Shots to get every 3 months (injections).  Pills to take every day (birth control pills).  A patch to change 1 time each week for 3 weeks (birth control patch). After that, the patch is taken off for 1 week.  A ring to put in the vagina. The ring is left in for 3 weeks. Then it is taken out of the vagina for 1 week. Then a new ring is put in.  Pills to take after unprotected sex (emergency birth control pills).  Barrier birth  control Here are some types of barrier birth control:  A thin covering that is put on the penis before sex (female condom). The covering is thrown away after sex.  A soft, loose covering that is put in the vagina before sex (female condom). The covering is thrown away after sex.  A rubber bowl that sits over the cervix (diaphragm). The bowl must be made for you. The bowl is put into the vagina before sex. The bowl is left in for 6-8 hours after sex. It is taken out within 24 hours.  A small, soft cup that fits over the cervix (cervical cap). The cup must be made for you. The cup can be left in for 6-8 hours after sex. It is taken out within 48 hours.  A sponge that is put into the vagina before sex. It must be left in for at least 6  hours after sex. It must be taken out within 30 hours. Then it is thrown away.  A chemical that kills or stops sperm from getting into the uterus (spermicide). It may be a pill, cream, jelly, or foam to put in the vagina. The chemical should be used at least 10-15 minutes before sex.  IUD (intrauterine) birth control An IUD is a small, T-shaped piece of plastic. It is put inside the uterus. There are two kinds:  Hormone IUD. This kind can stay in for 3-5 years.  Copper IUD. This kind can stay in for 10 years.  Permanent birth control Here are some types of permanent birth control:  Surgery to block the fallopian tubes.  Having an insert put into each fallopian tube.  Surgery to tie off the tubes that carry sperm (vasectomy).  Natural planning birth control Here are some types of natural planning birth control:  Not having sex on the days the woman could get pregnant.  Using a calendar: ? To keep track of the length of each period. ? To find out what days pregnancy can happen. ? To plan to not have sex on days when pregnancy can happen.  Watching for symptoms of ovulation and not having sex during ovulation. One way the woman can check for ovulation  is to check her temperature.  Waiting to have sex until after ovulation.  Summary  Contraception, also called birth control, means things to use or ways to try not to get pregnant.  Hormonal methods of birth control include implants, injections, pills, patches, vaginal rings, and emergency birth control pills.  Barrier methods of birth control can include female condoms, female condoms, diaphragms, cervical caps, sponges, and spermicides.  There are two types of IUD (intrauterine device) birth control. An IUD can be put in a woman's uterus to prevent pregnancy for 3-5 years.  Permanent sterilization can be done through a procedure for males, females, or both.  Natural planning methods involve not having sex on the days when the woman could get pregnant. This information is not intended to replace advice given to you by your health care provider. Make sure you discuss any questions you have with your health care provider. Document Released: 12/08/2008 Document Revised: 02/21/2016 Document Reviewed: 02/21/2016 Elsevier Interactive Patient Education  2017 [employer]

## 2017-02-22 NOTE — Progress Notes (Signed)
POSTPARTUM PROGRESS NOTE  Post Partum Day 1  Subjective:  [name] is a 31 y.o. [id number] s/p precipitous SVD at 283w6d.  No acute events overnight.  Pt denies problems with ambulating, voiding or po intake.  She denies nausea or vomiting.  Pain is well controlled.   Lochia Small.   Objective: Blood pressure 117/64, pulse 72, temperature 98.2 F (36.8 C), temperature source Oral, resp. rate 18, height 5\' 4"  (1.626 m), weight 183 lb (83 kg), last menstrual period 05/04/2016, SpO2 99 %, unknown if currently breastfeeding.  Physical Exam:  General: alert, cooperative and no distress Chest: no respiratory distress Heart:regular rate, distal pulses intact Abdomen: soft, nontender,  Uterine Fundus: firm, appropriately tender DVT Evaluation: No calf swelling or tenderness Extremities: no edema Skin: warm, dry  Recent Labs    02/21/17 0732  HGB 10.2*  HCT 32.8*    Assessment/Plan: [name] is a 31 y.o. [id number] s/p SVD at 373w6d   PPD#1 - Doing well Contraception: no longer wants BTL. Considering IUD vs Nexplanon Feeding: bottle Dispo: Plan for discharge tomorrow.   LOS: 1 day   Kandra NicolasJulie P DegeleMD 02/22/2017, 7:52 AM

## 2017-02-22 NOTE — Progress Notes (Signed)
RN verified with patient if she still wanted the bilateral tubal ligation. Patient stated that she has changed her mind and does not want the tubal.

## 2017-02-23 ENCOUNTER — Inpatient Hospital Stay (HOSPITAL_COMMUNITY): Payer: Medicaid Other

## 2017-02-23 MED ORDER — NORETHINDRONE 0.35 MG PO TABS: 1.0000 | ORAL_TABLET | Freq: Every day | ORAL | 11 refills | Status: DC

## 2017-02-23 MED ORDER — IBUPROFEN 600 MG PO TABS: 600.0000 mg | ORAL_TABLET | Freq: Four times a day (QID) | ORAL | 0 refills | Status: DC

## 2017-02-23 NOTE — Discharge Summary (Signed)
OB Discharge Summary     Patient Name: [name] DOB: 03/19/1985 MRN: [id number]  Date of admission: 02/21/2017 Delivering MD: Thressa ShellerHOGAN, HEATHER D   Date of discharge: 02/23/2017  Admitting diagnosis: 41 wks urge to push Intrauterine pregnancy: 4841w6d     Secondary diagnosis:  Active Problems:   Post-dates pregnancy   NSVD (normal spontaneous vaginal delivery)     Discharge diagnosis: Term Pregnancy Delivered                                                                                                Post partum procedures:none  Complications: None  Hospital course:  Induction of Labor With Vaginal Delivery   31 y.o. yo [id number] at 3041w6d was admitted to the hospital 02/21/2017 for induction of labor.  Indication for induction: Postdates.  Patient had an uncomplicated labor course as follows: Membrane Rupture Time/Date: 5:30 AM ,02/21/2017   Intrapartum Procedures: Episiotomy: None [1]                                         Lacerations:  None [1]  Patient had delivery of a Viable infant.  Information for the patient's newborn:  Luanne Braseal, Girl Leeba [[id number]][[id number]]  Delivery Method: Vaginal, Spontaneous(Filed from Delivery Summary)   02/21/2017  Details of delivery can be found in separate delivery note.  Patient had a routine postpartum course. Patient is discharged home 02/23/17.  Physical exam  Vitals:   02/22/17 1736 02/22/17 1854 02/23/17 0544 02/23/17 0628  BP:  113/66  124/70  Pulse:  78  65  Resp:  18  18  Temp:  98.3 F (36.8 C)  97.9 F (36.6 C)  TempSrc:  Oral  Oral  SpO2:    100%  Weight: 169 lb (76.7 kg)  170 lb 3.2 oz (77.2 kg)   Height:       General: alert, cooperative and no distress Lochia: appropriate Uterine Fundus: firm DVT Evaluation: No evidence of DVT seen on physical exam. Labs: Lab Results  Component Value Date   WBC 13.1 (H) 02/21/2017   HGB 10.2 (L) 02/21/2017   HCT 32.8 (L) 02/21/2017   MCV 74.5 (L) 02/21/2017   PLT 233 02/21/2017    CMP Latest Ref Rng & Units 11/17/2016  Glucose 65 - 99 mg/dL 130(Q114(H)  BUN 6 - 20 mg/dL 7  Creatinine 6.570.44 - 8.461.00 mg/dL 9.620.53  Sodium 952135 - 841145 mmol/L 131(L)  Potassium 3.5 - 5.1 mmol/L 3.0(L)  Chloride 101 - 111 mmol/L 100(L)  CO2 22 - 32 mmol/L 24  Calcium 8.9 - 10.3 mg/dL 3.2(G8.5(L)  Total Protein 6.5 - 8.1 g/dL 6.8  Total Bilirubin 0.3 - 1.2 mg/dL 0.6  Alkaline Phos 38 - 126 U/L 65  AST 15 - 41 U/L 17  ALT 14 - 54 U/L 10(L)    Discharge instruction: per After Visit Summary and "Baby and Me Booklet".  After visit meds:  Allergies as of 02/23/2017      Reactions   Sulfa Antibiotics  Rash      Medication List    TAKE these medications   ferrous sulfate 325 (65 FE) MG tablet Take 1 tablet (325 mg total) by mouth 2 (two) times daily with a meal.   ibuprofen 600 MG tablet Commonly known as:  ADVIL,MOTRIN Take 1 tablet (600 mg total) by mouth every 6 (six) hours.   norethindrone 0.35 MG tablet Commonly known as:  MICRONOR,CAMILA,ERRIN Take 1 tablet (0.35 mg total) by mouth daily.       Diet: routine diet  Activity: Advance as tolerated. Pelvic rest for 6 weeks.   Outpatient follow up:6 weeks Follow up Appt: Future Appointments  Date Time Provider Department Center  03/27/2017 10:00 AM Cheral MarkerBooker, Kimberly R, CNM [facility]   Follow up Visit:No Follow-up on file.  Postpartum contraception: Progesterone only pills  Newborn Data: Live born female  Birth Weight: 7 lb 15.3 oz (3609 g) APGAR: 7, 9  Newborn Delivery   Birth date/time:  02/21/2017 06:39:00 Delivery type:  Vaginal, Spontaneous     Baby Feeding: Breast Disposition:home with mother   02/23/2017 Willodean Rosenthalarolyn Harraway-Smith, MD

## 2017-02-25 LAB — TYPE AND SCREEN
ABO/RH(D): O POS
Antibody Screen: NEGATIVE
DONOR AG TYPE: NEGATIVE
Donor AG Type: NEGATIVE
PT AG Type: NEGATIVE
UNIT DIVISION: 0
UNIT DIVISION: 0

## 2017-02-25 LAB — BPAM RBC
BLOOD PRODUCT EXPIRATION DATE: 201901142359
BLOOD PRODUCT EXPIRATION DATE: 201901162359
Unit Type and Rh: 5100
Unit Type and Rh: 5100

## 2017-03-27 ENCOUNTER — Ambulatory Visit: Payer: Medicaid Other | Admitting: Women's Health

## 2017-03-30 ENCOUNTER — Ambulatory Visit: Payer: Medicaid Other | Admitting: Women's Health

## 2017-04-07 ENCOUNTER — Ambulatory Visit: Payer: Medicaid Other | Admitting: Advanced Practice Midwife

## 2017-04-07 ENCOUNTER — Encounter: Payer: Self-pay | Admitting: Advanced Practice Midwife

## 2017-09-02 ENCOUNTER — Encounter: Payer: Self-pay | Admitting: Advanced Practice Midwife

## 2017-10-06 ENCOUNTER — Telehealth: Payer: Self-pay

## 2017-10-06 ENCOUNTER — Other Ambulatory Visit: Payer: Self-pay

## 2017-10-06 DIAGNOSIS — O3680X0 Pregnancy with inconclusive fetal viability, not applicable or unspecified: Secondary | ICD-10-CM

## 2017-10-06 NOTE — Telephone Encounter (Signed)
First rcvd message on Nurse line [facility] stating sent referral for Ms. [name] to get a US scheduled because they were not able to get a fetal heart rate. So called pt to schedule US. Date is for 09/27/17 at 3:45p. Pt states LMP: 07/26/17, edd: 05/02/18, GA: 2488w2d. Pt verbalized understanding.

## 2017-10-07 ENCOUNTER — Ambulatory Visit (HOSPITAL_COMMUNITY)
Admission: RE | Admit: 2017-10-07 | Discharge: 2017-10-07 | Disposition: A | Discharge status: Home / Self Care | Payer: Medicaid Other | Source: Ambulatory Visit | Attending: Advanced Practice Midwife | Admitting: Advanced Practice Midwife

## 2017-10-07 DIAGNOSIS — O3680X0 Pregnancy with inconclusive fetal viability, not applicable or unspecified: Secondary | ICD-10-CM | POA: Insufficient documentation

## 2017-10-07 DIAGNOSIS — O208 Other hemorrhage in early pregnancy: Secondary | ICD-10-CM | POA: Diagnosis not present

## 2017-10-07 DIAGNOSIS — Z3A01 Less than 8 weeks gestation of pregnancy: Secondary | ICD-10-CM | POA: Diagnosis not present

## 2017-10-11 ENCOUNTER — Inpatient Hospital Stay (HOSPITAL_COMMUNITY): Payer: Medicaid Other

## 2017-10-11 ENCOUNTER — Inpatient Hospital Stay (HOSPITAL_COMMUNITY): Payer: Medicaid Other | Admitting: Anesthesiology

## 2017-10-11 ENCOUNTER — Encounter (HOSPITAL_COMMUNITY): Payer: Self-pay

## 2017-10-11 ENCOUNTER — Ambulatory Visit (HOSPITAL_COMMUNITY)
Admission: AD | Admit: 2017-10-11 | Discharge: 2017-10-11 | Disposition: A | Discharge status: Home / Self Care | Payer: Medicaid Other | Source: Ambulatory Visit | Attending: Obstetrics and Gynecology | Admitting: Obstetrics and Gynecology

## 2017-10-11 ENCOUNTER — Other Ambulatory Visit: Payer: Self-pay

## 2017-10-11 ENCOUNTER — Encounter (HOSPITAL_COMMUNITY)
Admission: AD | Disposition: A | Discharge status: Home / Self Care | Payer: Self-pay | Source: Ambulatory Visit | Attending: Obstetrics and Gynecology

## 2017-10-11 DIAGNOSIS — D649 Anemia, unspecified: Secondary | ICD-10-CM

## 2017-10-11 DIAGNOSIS — O034 Incomplete spontaneous abortion without complication: Secondary | ICD-10-CM | POA: Diagnosis not present

## 2017-10-11 DIAGNOSIS — Z882 Allergy status to sulfonamides status: Secondary | ICD-10-CM | POA: Diagnosis not present

## 2017-10-11 DIAGNOSIS — Z87891 Personal history of nicotine dependence: Secondary | ICD-10-CM | POA: Diagnosis not present

## 2017-10-11 DIAGNOSIS — O209 Hemorrhage in early pregnancy, unspecified: Secondary | ICD-10-CM

## 2017-10-11 DIAGNOSIS — Z3A01 Less than 8 weeks gestation of pregnancy: Secondary | ICD-10-CM | POA: Diagnosis not present

## 2017-10-11 DIAGNOSIS — O031 Delayed or excessive hemorrhage following incomplete spontaneous abortion: Secondary | ICD-10-CM | POA: Insufficient documentation

## 2017-10-11 DIAGNOSIS — O208 Other hemorrhage in early pregnancy: Secondary | ICD-10-CM

## 2017-10-11 DIAGNOSIS — Z3490 Encounter for supervision of normal pregnancy, unspecified, unspecified trimester: Secondary | ICD-10-CM

## 2017-10-11 DIAGNOSIS — R55 Syncope and collapse: Secondary | ICD-10-CM

## 2017-10-11 DIAGNOSIS — S43006A Unspecified dislocation of unspecified shoulder joint, initial encounter: Secondary | ICD-10-CM | POA: Diagnosis present

## 2017-10-11 DIAGNOSIS — O4691 Antepartum hemorrhage, unspecified, first trimester: Secondary | ICD-10-CM | POA: Diagnosis present

## 2017-10-11 DIAGNOSIS — R42 Dizziness and giddiness: Secondary | ICD-10-CM | POA: Diagnosis not present

## 2017-10-11 HISTORY — PX: DILATION AND EVACUATION: SHX1459

## 2017-10-11 HISTORY — PX: DILATION AND CURETTAGE OF UTERUS: SHX78

## 2017-10-11 LAB — CBC
HCT: 25.4 % — ABNORMAL LOW (ref 36.0–46.0)
HEMATOCRIT: 31.1 % — AB (ref 36.0–46.0)
HEMATOCRIT: 25.3 % — AB (ref 36.0–46.0)
HEMOGLOBIN: 9.7 g/dL — AB (ref 12.0–15.0)
HEMOGLOBIN: 7.7 g/dL — AB (ref 12.0–15.0)
Hemoglobin: 7.9 g/dL — ABNORMAL LOW (ref 12.0–15.0)
MCH: 21.8 pg — AB (ref 26.0–34.0)
MCH: 22.1 pg — AB (ref 26.0–34.0)
MCH: 22.8 pg — ABNORMAL LOW (ref 26.0–34.0)
MCHC: 31.2 g/dL (ref 30.0–36.0)
MCHC: 30.4 g/dL (ref 30.0–36.0)
MCHC: 31.1 g/dL (ref 30.0–36.0)
MCV: 69.9 fL — AB (ref 78.0–100.0)
MCV: 72.5 fL — AB (ref 78.0–100.0)
MCV: 73.4 fL — AB (ref 78.0–100.0)
PLATELETS: 176 10*3/uL (ref 150–400)
Platelets: 181 10*3/uL (ref 150–400)
Platelets: 186 10*3/uL (ref 150–400)
RBC: 4.45 MIL/uL (ref 3.87–5.11)
RBC: 3.49 MIL/uL — ABNORMAL LOW (ref 3.87–5.11)
RBC: 3.46 MIL/uL — AB (ref 3.87–5.11)
RDW: 18.7 % — AB (ref 11.5–15.5)
RDW: 18.6 % — ABNORMAL HIGH (ref 11.5–15.5)
RDW: 19.2 % — AB (ref 11.5–15.5)
WBC: 4.3 10*3/uL (ref 4.0–10.5)
WBC: 7.4 10*3/uL (ref 4.0–10.5)
WBC: 9.9 10*3/uL (ref 4.0–10.5)

## 2017-10-11 LAB — PREPARE RBC (CROSSMATCH)

## 2017-10-11 SURGERY — DILATION AND EVACUATION, UTERUS
Anesthesia: General | Site: Vagina

## 2017-10-11 MED ORDER — OXYCODONE-ACETAMINOPHEN 5-325 MG PO TABS: 1.0000 | ORAL_TABLET | Freq: Four times a day (QID) | ORAL | 0 refills | Status: DC | PRN

## 2017-10-11 MED ORDER — LIDOCAINE HCL (CARDIAC) PF 100 MG/5ML IV SOSY
PREFILLED_SYRINGE | INTRAVENOUS | Status: AC
  Filled 2017-10-11: qty 5

## 2017-10-11 MED ORDER — ALBUMIN HUMAN 5 % IV SOLN
INTRAVENOUS | Status: DC | PRN
  Administered 2017-10-11: 13:00:00 via INTRAVENOUS

## 2017-10-11 MED ORDER — ACETAMINOPHEN 325 MG PO TABS: 325.0000 mg | ORAL_TABLET | ORAL | Status: DC | PRN

## 2017-10-11 MED ORDER — ONDANSETRON HCL 4 MG/2ML IJ SOLN
INTRAMUSCULAR | Status: AC
  Filled 2017-10-11: qty 2

## 2017-10-11 MED ORDER — SIMETHICONE 80 MG PO CHEW: 80.0000 mg | CHEWABLE_TABLET | Freq: Four times a day (QID) | ORAL | Status: DC | PRN

## 2017-10-11 MED ORDER — PROPOFOL 10 MG/ML IV BOLUS
INTRAVENOUS | Status: AC
  Filled 2017-10-11: qty 20

## 2017-10-11 MED ORDER — ACETAMINOPHEN 160 MG/5ML PO SOLN: 325.0000 mg | ORAL | Status: DC | PRN

## 2017-10-11 MED ORDER — SODIUM CHLORIDE 0.9 % IV SOLN: Freq: Once | INTRAVENOUS | Status: DC

## 2017-10-11 MED ORDER — ACETAMINOPHEN 500 MG PO TABS
1000.0000 mg | ORAL_TABLET | Freq: Once | ORAL | Status: AC
  Administered 2017-10-11: 1000 mg via ORAL
  Filled 2017-10-11: qty 2

## 2017-10-11 MED ORDER — SODIUM CHLORIDE 0.9 % IV SOLN
Freq: Once | INTRAVENOUS | Status: AC
Start: 2017-10-11 — End: 2017-10-11
  Administered 2017-10-11: 10:00:00 via INTRAVENOUS

## 2017-10-11 MED ORDER — LACTATED RINGERS IV SOLN
INTRAVENOUS | Status: DC | PRN
  Administered 2017-10-11 (×2): via INTRAVENOUS

## 2017-10-11 MED ORDER — OXYCODONE HCL 5 MG PO TABS: 5.0000 mg | ORAL_TABLET | Freq: Once | ORAL | Status: DC | PRN

## 2017-10-11 MED ORDER — ONDANSETRON HCL 4 MG/2ML IJ SOLN
INTRAMUSCULAR | Status: DC | PRN
  Administered 2017-10-11: 4 mg via INTRAVENOUS

## 2017-10-11 MED ORDER — OXYCODONE-ACETAMINOPHEN 5-325 MG PO TABS: 1.0000 | ORAL_TABLET | ORAL | Status: DC | PRN

## 2017-10-11 MED ORDER — LACTATED RINGERS IV BOLUS
1000.0000 mL | Freq: Once | INTRAVENOUS | Status: AC
  Administered 2017-10-11: 1000 mL via INTRAVENOUS

## 2017-10-11 MED ORDER — DEXAMETHASONE SODIUM PHOSPHATE 10 MG/ML IJ SOLN
INTRAMUSCULAR | Status: DC | PRN
  Administered 2017-10-11: 10 mg via INTRAVENOUS

## 2017-10-11 MED ORDER — DOXYCYCLINE HYCLATE 100 MG IV SOLR
200.0000 mg | INTRAVENOUS | Status: AC
  Administered 2017-10-11: 200 mg via INTRAVENOUS
  Filled 2017-10-11: qty 200

## 2017-10-11 MED ORDER — SODIUM CHLORIDE 0.9% IV SOLUTION: Freq: Once | INTRAVENOUS | Status: DC

## 2017-10-11 MED ORDER — NORETHINDRONE 0.35 MG PO TABS: 1.0000 | ORAL_TABLET | Freq: Every day | ORAL | 11 refills | Status: DC

## 2017-10-11 MED ORDER — GLYCOPYRROLATE 0.2 MG/ML IJ SOLN
INTRAMUSCULAR | Status: DC | PRN
  Administered 2017-10-11: 0.2 mg via INTRAVENOUS

## 2017-10-11 MED ORDER — MIDAZOLAM HCL 2 MG/2ML IJ SOLN
INTRAMUSCULAR | Status: DC | PRN
  Administered 2017-10-11: 2 mg via INTRAVENOUS

## 2017-10-11 MED ORDER — FENTANYL CITRATE (PF) 100 MCG/2ML IJ SOLN
INTRAMUSCULAR | Status: AC
  Filled 2017-10-11: qty 2

## 2017-10-11 MED ORDER — IBUPROFEN 600 MG PO TABS: 600.0000 mg | ORAL_TABLET | Freq: Four times a day (QID) | ORAL | 0 refills | Status: DC | PRN

## 2017-10-11 MED ORDER — SODIUM CHLORIDE 0.9 % IV SOLN
Freq: Once | INTRAVENOUS | Status: AC
  Administered 2017-10-11: 10:00:00 via INTRAVENOUS

## 2017-10-11 MED ORDER — LACTATED RINGERS IV SOLN
INTRAVENOUS | Status: DC
Start: 2017-10-11 — End: 2017-10-12
  Administered 2017-10-11: 16:00:00 via INTRAVENOUS

## 2017-10-11 MED ORDER — LIDOCAINE HCL (CARDIAC) PF 100 MG/5ML IV SOSY
PREFILLED_SYRINGE | INTRAVENOUS | Status: DC | PRN
  Administered 2017-10-11: 80 mg via INTRAVENOUS

## 2017-10-11 MED ORDER — OXYCODONE-ACETAMINOPHEN 5-325 MG PO TABS: 2.0000 | ORAL_TABLET | ORAL | Status: DC | PRN

## 2017-10-11 MED ORDER — DEXAMETHASONE SODIUM PHOSPHATE 10 MG/ML IJ SOLN
INTRAMUSCULAR | Status: AC
  Filled 2017-10-11: qty 1

## 2017-10-11 MED ORDER — IBUPROFEN 600 MG PO TABS: 600.0000 mg | ORAL_TABLET | Freq: Four times a day (QID) | ORAL | Status: DC | PRN

## 2017-10-11 MED ORDER — MEPERIDINE HCL 25 MG/ML IJ SOLN: 6.2500 mg | INTRAMUSCULAR | Status: DC | PRN

## 2017-10-11 MED ORDER — SILVER NITRATE-POT NITRATE 75-25 % EX MISC
CUTANEOUS | Status: DC | PRN
  Administered 2017-10-11: 1
  Administered 2017-10-11: 3

## 2017-10-11 MED ORDER — ALBUMIN HUMAN 5 % IV SOLN
INTRAVENOUS | Status: AC
  Filled 2017-10-11: qty 250

## 2017-10-11 MED ORDER — ONDANSETRON HCL 4 MG PO TABS: 4.0000 mg | ORAL_TABLET | Freq: Four times a day (QID) | ORAL | Status: DC | PRN

## 2017-10-11 MED ORDER — ONDANSETRON HCL 4 MG/2ML IJ SOLN: 4.0000 mg | Freq: Four times a day (QID) | INTRAMUSCULAR | Status: DC | PRN

## 2017-10-11 MED ORDER — FENTANYL CITRATE (PF) 100 MCG/2ML IJ SOLN
INTRAMUSCULAR | Status: DC | PRN
  Administered 2017-10-11 (×2): 50 ug via INTRAVENOUS

## 2017-10-11 MED ORDER — PROPOFOL 10 MG/ML IV BOLUS
INTRAVENOUS | Status: DC | PRN
  Administered 2017-10-11: 150 mg via INTRAVENOUS

## 2017-10-11 MED ORDER — MIDAZOLAM HCL 2 MG/2ML IJ SOLN
INTRAMUSCULAR | Status: AC
  Filled 2017-10-11: qty 2

## 2017-10-11 MED ORDER — OXYCODONE HCL 5 MG/5ML PO SOLN: 5.0000 mg | Freq: Once | ORAL | Status: DC | PRN

## 2017-10-11 MED ORDER — SODIUM CHLORIDE 0.9 % IV SOLN
INTRAVENOUS | Status: DC | PRN
  Administered 2017-10-11: 13:00:00 via INTRAVENOUS

## 2017-10-11 MED ORDER — ONDANSETRON HCL 4 MG/2ML IJ SOLN: 4.0000 mg | Freq: Once | INTRAMUSCULAR | Status: DC | PRN

## 2017-10-11 MED ORDER — FENTANYL CITRATE (PF) 100 MCG/2ML IJ SOLN: 25.0000 ug | INTRAMUSCULAR | Status: DC | PRN

## 2017-10-11 SURGICAL SUPPLY — 19 items
CATH ROBINSON RED A/P 16FR (CATHETERS) ×2 IMPLANT
DECANTER SPIKE VIAL GLASS SM (MISCELLANEOUS) ×2 IMPLANT
GLOVE BIOGEL PI IND STRL 6.5 (GLOVE) ×1 IMPLANT
GLOVE BIOGEL PI IND STRL 7.0 (GLOVE) ×1 IMPLANT
GLOVE BIOGEL PI INDICATOR 6.5 (GLOVE) ×1
GLOVE BIOGEL PI INDICATOR 7.0 (GLOVE) ×1
GLOVE ORTHOPEDIC STR SZ6.5 (GLOVE) ×2 IMPLANT
GOWN STRL REUS W/TWL LRG LVL3 (GOWN DISPOSABLE) ×4 IMPLANT
KIT BERKELEY 1ST TRIMESTER 3/8 (MISCELLANEOUS) ×2 IMPLANT
NS IRRIG 1000ML POUR BTL (IV SOLUTION) ×2 IMPLANT
PACK VAGINAL MINOR WOMEN LF (CUSTOM PROCEDURE TRAY) ×2 IMPLANT
PAD OB MATERNITY 4.3X12.25 (PERSONAL CARE ITEMS) ×2 IMPLANT
PAD PREP 24X48 CUFFED NSTRL (MISCELLANEOUS) ×2 IMPLANT
SET BERKELEY SUCTION TUBING (SUCTIONS) ×2 IMPLANT
TOWEL OR 17X24 6PK STRL BLUE (TOWEL DISPOSABLE) ×4 IMPLANT
VACURETTE 10 RIGID CVD (CANNULA) IMPLANT
VACURETTE 7MM CVD STRL WRAP (CANNULA) ×2 IMPLANT
VACURETTE 8 RIGID CVD (CANNULA) IMPLANT
VACURETTE 9 RIGID CVD (CANNULA) IMPLANT

## 2017-10-11 NOTE — Progress Notes (Signed)
RN reviewed discharge instructions and prescriptions with pt using teach-back method. Pt verbalized understanding. Vital signs WDL. Pain assessed, pt stated no pain. Pt discharged home via wheelchair with significant other and child.

## 2017-10-11 NOTE — Op Note (Signed)
[name] PROCEDURE DATE:  10/11/2017  PREOPERATIVE DIAGNOSIS: incomplete abortion @ 7 weeks, twins POSTOPERATIVE DIAGNOSIS: The same PROCEDURE:  Suction dilation and evacuation SURGEON:  Dr. Baldemar LenisK. Meryl Davis  INDICATIONS: 32 y.o.  [id number] presenting with bleeding s/p incomplete abortion, recommended for surgical management.  Risks of surgery were discussed with the patient including but not limited to: bleeding which may require transfusion; infection which may require antibiotics; injury to uterus or surrounding organs; need for additional procedures; possibility of intrauterine scarring which may impair future fertility; and other postoperative/anesthesia complications. Written informed consent was obtained.  ABO, Rh: --/--/O POS (08/18 0935) CBC    Component Value Date/Time   WBC 7.4 10/11/2017 1316   RBC 3.49 (L) 10/11/2017 1316   HGB 7.7 (L) 10/11/2017 1316   HGB 9.8 (L) 11/20/2016 0914   HCT 25.3 (L) 10/11/2017 1316   HCT 31.4 (L) 11/20/2016 0914   PLT 186 10/11/2017 1316   PLT 214 11/20/2016 0914   MCV 72.5 (L) 10/11/2017 1316   MCV 75 (L) 11/20/2016 0914   MCH 22.1 (L) 10/11/2017 1316   MCHC 30.4 10/11/2017 1316   RDW 18.6 (H) 10/11/2017 1316   RDW 18.1 (H) 11/20/2016 0914   LYMPHSABS 1.3 05/20/2010 1532   MONOABS 0.4 05/20/2010 1532   EOSABS 0.3 05/20/2010 1532   BASOSABS 0.0 05/20/2010 1532    FINDINGS:  Normal appearing female genitalia with cervix dilated approx 1.5 cm, large clot in vagina with active bleeding noted from cervix. Significant amount of retained products of conception within uterus.   ANESTHESIA:    General anesthesia INTRAVENOUS FLUIDS:  1200 ml of LR, 250 mL albumin ESTIMATED BLOOD LOSS:  300 ml in OR, approx 1500 mL since arrival to hospital SPECIMENS:  Products of conception sent to pathology COMPLICATIONS:  None immediate.  PROCEDURE DETAILS:   She was then taken to the operating room where anesthesia was administered and was found to be  adequate.  After an adequate timeout was performed, she was placed in the dorsal lithotomy position and examined; then prepped and draped in the sterile manner.   Her bladder was catheterized for an unmeasured amount of clear, yellow urine. A vaginal speculum was then placed in the patient's vagina and a single tooth tenaculum was applied to the anterior lip of the cervix.  The cervix was open and did not need to be dilated. A 7 mm suction curette that was gently advanced to the uterine fundus.  The suction device was then activated and curette slowly rotated to clear the uterus of products of conception.  This was repeated until the endometrial cavity was cleared. Her bleeding slowly significantly with removal of products. A sharp curettage was then performed to confirm complete emptying of the uterus and minimal products were removed. The curette was advanced to the fundus and activated one last time and removed and the procedure was finished. There was minor bleeding noted at the end of the procedure which improved with pressure and silver nitrate, and the tenaculum removed with good hemostasis noted.   All instruments were removed from the patient's vagina.  Sponge and instrument counts were correct times three.  The patient tolerated the procedure well and was taken to the recovery area awake, extubated and in stable condition. Patient given IV doxycycline post op as it was not available at start of procedure.   The patient will be given 1 unit of blood given drop in H/H and observed. If d/c'd home today, will be  given instructions for follow up and she will follow up in the clinic in 2-3 weeks for postoperative evaluation.   Baldemar LenisK. Meryl Davis, M.D. [facility]

## 2017-10-11 NOTE — Anesthesia Postprocedure Evaluation (Signed)
Anesthesia Post Note  Patient: [name]  Procedure(s) Performed: suction dilation and evacuation (N/A Vagina )     Patient location during evaluation: PACU Anesthesia Type: General Level of consciousness: awake and alert and oriented Pain management: pain level controlled Vital Signs Assessment: post-procedure vital signs reviewed and stable Respiratory status: spontaneous breathing, nonlabored ventilation and respiratory function stable Cardiovascular status: blood pressure returned to baseline and stable Postop Assessment: no apparent nausea or vomiting Anesthetic complications: no    Last Vitals:  Vitals:   10/11/17 1500 10/11/17 1522  BP: (!) 101/58 94/63  Pulse: 60 64  Resp: 16 19  Temp:  36.4 C  SpO2: 100% 100%    Last Pain:  Vitals:   10/11/17 1530  TempSrc:   PainSc: 0-No pain   Pain Goal:                 Adrianna Dudas A.

## 2017-10-11 NOTE — Discharge Summary (Signed)
Physician Discharge Summary  Patient ID: [name] MRN: [id number] DOB/AGE: August 10, 1985 32 y.o.  Admit date: 10/11/2017 Discharge date: 10/11/2017  Admission Diagnoses: incomplete abortion  Discharge Diagnoses:  Active Problems:   Dislocated shoulder   Supervision of normal pregnancy   Incomplete abortion   Discharged Condition: fair  Hospital Course: Please see HPI dated 10/11/2017 for full details. Briefly, this is a 32 y.o. [id number] female admitted for suction D&E for incomplete abortion with significant blood loss. She had uncomplicated suction D&E and was kept for observation for several hours during which she received 1 unit pRBCs. Her post transfusion H/H was appropriate and she is ambulating without issue. Denies light/headedness or dizziness. She was discharged home POD#0 and will follow up in the office. She was given instructions for discharge.  Physical Exam:  BP 113/73 (BP Location: Left Arm)   Pulse (!) 101   Temp 98.2 F (36.8 C) (Oral)   Resp 17   Ht 5\' 4"  (1.626 m)   Wt 74.8 kg   SpO2 100%   BMI 28.32 kg/m  General: alert, oriented, cooperative Chest: CTAB, normal respiratory effort Heart: RRR  Abdomen: soft, appropriately tender to palpation  DVT Evaluation: no evidence of DVT Extremities: no edema  Labs: Lab Results  Component Value Date   WBC 9.9 10/11/2017   HGB 7.9 (L) 10/11/2017   HCT 25.4 (L) 10/11/2017   MCV 73.4 (L) 10/11/2017   PLT 176 10/11/2017   CMP Latest Ref Rng & Units 11/17/2016  Glucose 65 - 99 mg/dL 578(I)  BUN 6 - 20 mg/dL 7  Creatinine 6.96 - 2.95 mg/dL 2.84  Sodium 132 - 440 mmol/L 131(L)  Potassium 3.5 - 5.1 mmol/L 3.0(L)  Chloride 101 - 111 mmol/L 100(L)  CO2 22 - 32 mmol/L 24  Calcium 8.9 - 10.3 mg/dL 1.0(U)  Total Protein 6.5 - 8.1 g/dL 6.8  Total Bilirubin 0.3 - 1.2 mg/dL 0.6  Alkaline Phos 38 - 126 U/L 65  AST 15 - 41 U/L 17  ALT 14 - 54 U/L 10(L)    Disposition: Discharge disposition: 01-Home or Self  Care      Discharge Instructions    Call MD for:  difficulty breathing, headache or visual disturbances   Complete by:  As directed    Call MD for:  extreme fatigue   Complete by:  As directed    Call MD for:  hives   Complete by:  As directed    Call MD for:  persistant dizziness or light-headedness   Complete by:  As directed    Call MD for:  persistant nausea and vomiting   Complete by:  As directed    Call MD for:  redness, tenderness, or signs of infection (pain, swelling, redness, odor or green/yellow discharge around incision site)   Complete by:  As directed    Call MD for:  severe uncontrolled pain   Complete by:  As directed    Call MD for:  temperature >100.4   Complete by:  As directed    Diet - low sodium heart healthy   Complete by:  As directed    Increase activity slowly   Complete by:  As directed      An After Visit Summary was printed and given to the patient. Allergies as of 10/11/2017      Reactions   Sulfa Antibiotics Rash      Medication List    STOP taking these medications   acetaminophen 325 MG  tablet Commonly known as:  TYLENOL   PRENATAL GUMMIES/DHA & FA 0.4-32.5 MG Chew     TAKE these medications   ferrous sulfate 325 (65 FE) MG tablet Take 1 tablet (325 mg total) by mouth 2 (two) times daily with a meal.   ibuprofen 600 MG tablet Commonly known as:  ADVIL,MOTRIN Take 1 tablet (600 mg total) by mouth every 6 (six) hours. What changed:  Another medication with the same name was added. Make sure you understand how and when to take each.   ibuprofen 600 MG tablet Commonly known as:  ADVIL,MOTRIN Take 1 tablet (600 mg total) by mouth every 6 (six) hours as needed (mild pain). What changed:  You were already taking a medication with the same name, and this prescription was added. Make sure you understand how and when to take each.   norethindrone 0.35 MG tablet Commonly known as:  MICRONOR,CAMILA,ERRIN Take 1 tablet (0.35 mg total) by  mouth daily.   oxyCODONE-acetaminophen 5-325 MG tablet Commonly known as:  PERCOCET/ROXICET Take 1 tablet by mouth every 6 (six) hours as needed for moderate pain ((when tolerating fluids)).      Follow-up Information    [facility] for [facility]. Schedule an appointment as soon as possible for a visit in 2 week(s).   Specialty:  Obstetrics and Gynecology Contact information: [address] [place] [address] [phone]          Signed: Conan Bowens 10/11/2017, 10:11 PM

## 2017-10-11 NOTE — Progress Notes (Signed)
Blood bank called and informed RN that pt is historically + for Anti E antibodies and must be completely crossmatched before administering any blood. RN informs Ivonne AndrewV Smith CNM

## 2017-10-11 NOTE — MAU Provider Note (Addendum)
Chief Complaint: Vaginal Bleeding; Abdominal Pain; and Back Pain   First Provider Initiated Contact with Patient 10/11/17 1044     SUBJECTIVE HPI: [name] is a 32 y.o. G5P4004 at 7829w3d by ultrasound 10/07/2017 who presents to Maternity Admissions reporting heavy bleeding since 1 AM.  Soaked through an ultra tampon and 2 pads in 10 minutes.  Now reports low back pain and dizziness.  Is been seen at pregnancy care center where they were concerned that the pregnancy may not be developing properly.  [facility]14 as mentioned above.  Ultrasound showed possible twin pregnancy with fetal poles measuring 5 weeks 6 days without cardiac activity.  Uncertain whether this was due to early gestational age or failing pregnancy.  Last solid food 1 PM 10/10/2017.  Had a sip of Coke and a sip of water with Tylenol this morning.  Past Medical History:  Diagnosis Date  . Anemia   . Blood transfusion without reported diagnosis   . IBS (irritable bowel syndrome)    with constipation  . Shoulder dislocation, recurrent    OB History  Gravida Para Term Preterm AB Living  5 4 4     4   SAB TAB Ectopic Multiple Live Births        0 4    # Outcome Date GA Lbr Len/2nd Weight Sex Delivery Anes PTL Lv  5 Current           4 Term 02/21/17 4107w6d  3609 g F Vag-Spont None  LIV  3 Term 12/02/08 7954w0d  3459 g M Vag-Spont None N LIV  2 Term 08/18/06 8254w0d  3289 g F Vag-Spont None Y LIV  1 Term 08/14/04 6254w0d  3430 g M Vag-Spont EPI N LIV   Past Surgical History:  Procedure Laterality Date  . TUBAL LIGATION Bilateral 02/21/2017   Procedure: POST PARTUM TUBAL LIGATION;  Surgeon:  BingPickens, Charlie, MD;  [facility];  Service: Gynecology;  Laterality: Bilateral;  . WISDOM TOOTH EXTRACTION     Social History   Socioeconomic History  . Marital status: Married    Spouse name: Not on file  . Number of children: Not on file  . Years of education: Not on file  .  Highest education level: Not on file  Occupational History  . Not on file  Social Needs  . Financial resource strain: Not on file  . Food insecurity:    Worry: Not on file    Inability: Not on file  . Transportation needs:    Medical: Not on file    Non-medical: Not on file  Tobacco Use  . Smoking status: Former Smoker    Types: Cigarettes    Last attempt to quit: 07/22/2004    Years since quitting: 13.2  . Smokeless tobacco: Never Used  Substance and Sexual Activity  . Alcohol use: No    Comment: none since +UPT  . Drug use: No  . Sexual activity: Yes    Birth control/protection: None  Lifestyle  . Physical activity:    Days per week: Not on file    Minutes per session: Not on file  . Stress: Not on file  Relationships  . Social connections:    Talks on phone: Not on file    Gets together: Not on file    Attends religious service: Not on file    Active member of club or organization: Not on file    Attends meetings of clubs or  organizations: Not on file    Relationship status: Not on file  . Intimate partner violence:    Fear of current or ex partner: Not on file    Emotionally abused: Not on file    Physically abused: Not on file    Forced sexual activity: Not on file  Other Topics Concern  . Not on file  Social History Narrative  . Not on file   Family History  Problem Relation Age of Onset  . Diabetes Sister   . Cancer Paternal Grandmother        breast  . Asthma Son   . Diabetes Maternal Aunt   . Cancer Paternal Aunt        cervical, breast  . Colon cancer Neg Hx    No current facility-administered medications on file prior to encounter.    Current Outpatient Medications on File Prior to Encounter  Medication Sig Dispense Refill  . ferrous sulfate 325 (65 FE) MG tablet Take 1 tablet (325 mg total) by mouth 2 (two) times daily with a meal. 60 tablet 3  . ibuprofen (ADVIL,MOTRIN) 600 MG tablet Take 1 tablet (600 mg total) by mouth every 6 (six) hours.  30 tablet 0  . norethindrone (MICRONOR,CAMILA,ERRIN) 0.35 MG tablet Take 1 tablet (0.35 mg total) by mouth daily. 1 Package 11   Allergies  Allergen Reactions  . Sulfa Antibiotics Rash    I have reviewed patient's Past Medical Hx, Surgical Hx, Family Hx, Social Hx, medications and allergies.   Review of Systems  Constitutional: Negative for chills and fever.  Gastrointestinal: Negative for abdominal pain.  Genitourinary: Positive for vaginal discharge. Negative for vaginal bleeding.  Musculoskeletal: Positive for back pain.  Neurological: Positive for dizziness. Negative for syncope.  Hematological: Does not bruise/bleed easily.    OBJECTIVE Patient Vitals for the past 24 hrs:  BP Temp Temp src Pulse Resp  10/11/17 0921 (!) 78/53 98.5 F (36.9 C) Oral (!) 143 18   Constitutional: Well-developed, well-nourished female in no acute distress.  Skin: No pallor. Cardiovascular: tachycardic Respiratory: normal rate and effort.  GI: Abd soft, mild suprapubic tenderness MS: Extremities nontender, no edema, normal ROM Neurologic: Alert and oriented x 4.  GU: Initially patient only having small amount of bleeding.  When patient is bedpan to go to the bathroom she passed 350 cc clot.  SPECULUM EXAM: NEFG, physiologic discharge, moderate amount of active bright red blood noted, cervix centimeter dilated with moderate amount of clot coming through the cervix.  Clot removed with ring forceps.  Patient continued to have moderate amount of active bleeding.  Unable to reach tissue.  QBL 831 cc at the time.  LAB RESULTS Results for orders placed or performed during the hospital encounter of 10/11/17 (from the past 24 hour(s))  CBC     Status: Abnormal   Collection Time: 10/11/17  9:35 AM  Result Value Ref Range   WBC 4.3 4.0 - 10.5 K/uL   RBC 4.45 3.87 - 5.11 MIL/uL   Hemoglobin 9.7 (L) 12.0 - 15.0 g/dL   HCT 69.631.1 (L) 29.536.0 - 28.446.0 %   MCV 69.9 (L) 78.0 - 100.0 fL   MCH 21.8 (L) 26.0 - 34.0  pg   MCHC 31.2 30.0 - 36.0 g/dL   RDW 13.218.7 (H) 44.011.5 - 10.215.5 %   Platelets 181 150 - 400 K/uL    IMAGING [place] Ob Comp Less 14 Wks  Result Date: 10/07/2017 CLINICAL DATA:  Pregnant, fetal viability EXAM: TWIN OBSTETRICAL ULTRASOUND <  14 WKS COMPARISON:  None. FINDINGS: Number of IUPs:  2 Chorionicity/Amnionicity: Suspected monochorionic-diamniotic (thin membrane) TWIN 1 Yolk sac:  Not Visualized. Embryo:  Visualized. Cardiac Activity: Not Visualized. CRL:   3.0 mm   5 w 6 d TWIN 2 Yolk sac:  Not Visualized. Embryo:  Visualized. Cardiac Activity: Not Visualized. CRL:   2.7 mm   5 w 5 d Subchorionic hemorrhage:  Small subchronic hemorrhage. Maternal uterus/adnexae: Bilateral ovaries are within normal limits. No free fluid. IMPRESSION: Suspected early twin gestations, as described above, measuring approximately 5 weeks 6 days by crown-rump length. No cardiac activity is visualized, possibly due to early gestational size. Follow-up ultrasound is suggested in 10-14 days to confirm viability, as clinically warranted. Electronically Signed   By: Charline Bills M.D.   On: 10/07/2017 16:56   US Ob Transvaginal  Result Date: 10/11/2017 CLINICAL DATA:  32 year old pregnant female with current heavy vaginal bleeding. Possible twin pregnancy identified on 10/07/2017 ultrasound EXAM: TRANSVAGINAL OB ULTRASOUND TECHNIQUE: Transvaginal ultrasound was performed for complete evaluation of the gestation as well as the maternal uterus, adnexal regions, and pelvic cul-de-sac. COMPARISON:  10/07/2017 FINDINGS: Intrauterine gestational sac: Single but now located in the LOWER uterine segment/cervix and has a new elongated appearance. Yolk sac:  Not Visualized. Embryo:  Not Visualized. Cardiac Activity: Not Visualized. MSD: 23.2 mm   7 w   2  d Subchorionic hemorrhage:  None visualized. Maternal uterus/adnexae: The ovaries bilaterally are unremarkable. No free fluid or adnexal mass. IMPRESSION: 1. Abnormal appearing intrauterine  gestational sac now lies in the LOWER uterine segment/cervix and now without identifiable yolk sac or embryo, almost certainly representing abortion in progress/nonviable 1st trimester pregnancy given history. Follow-up as clinically indicated. Electronically Signed   By: Harmon Pier M.D.   On: 10/11/2017 10:53   US Ob Transvaginal  Result Date: 10/07/2017 CLINICAL DATA:  Pregnant, fetal viability EXAM: TWIN OBSTETRICAL ULTRASOUND <14 WKS COMPARISON:  None. FINDINGS: Number of IUPs:  2 Chorionicity/Amnionicity: Suspected monochorionic-diamniotic (thin membrane) TWIN 1 Yolk sac:  Not Visualized. Embryo:  Visualized. Cardiac Activity: Not Visualized. CRL:   3.0 mm   5 w 6 d TWIN 2 Yolk sac:  Not Visualized. Embryo:  Visualized. Cardiac Activity: Not Visualized. CRL:   2.7 mm   5 w 5 d Subchorionic hemorrhage:  Small subchronic hemorrhage. Maternal uterus/adnexae: Bilateral ovaries are within normal limits. No free fluid. IMPRESSION: Suspected early twin gestations, as described above, measuring approximately 5 weeks 6 days by crown-rump length. No cardiac activity is visualized, possibly due to early gestational size. Follow-up ultrasound is suggested in 10-14 days to confirm viability, as clinically warranted. Electronically Signed   By: Charline Bills M.D.   On: 10/07/2017 16:56    MAU COURSE Orders Placed This Encounter  Procedures  . US OB Transvaginal  . CBC  . Diet NPO time specified  . Orthostatic vital signs  . Type and screen   Meds ordered this encounter  Medications  . 0.9 %  sodium chloride infusion  . 0.9 %  sodium chloride infusion  . acetaminophen (TYLENOL) tablet 1,000 mg   Discussed Hx, labs, exam w/ Dr. Earlene Plater, specifically ultrasound, hemoglobin 9.7 (baseline approximately 10), dizziness resolved with fluid bolus. New orders: Possibly candidate for Cytotec if she is not having ongoing significant bleeding or dizziness.  Otherwise she will need D&C.   1212: Called Dr. Earlene Plater  with update on heavy bleeding.  Coming to bedside to discuss D&C.  ~1218: Called to bedside emergently by  RN for patient having syncopal episode.  Patient aroused with ammonia, very pale.  Reported right shoulder pain consistent with recurrent dislocation.  No seizure-like activity.  Patient not acting post-ictal.  Dr. Earlene Plater at bedside discussing need for emergent D&C, discussed risks, benefits, indications.  Patient verbalized understanding and gives consent.  MDM -Incomplete abortion with active heavy bleeding, symptomatic anemia.  Prep for OR for D&C.  ASSESSMENT 1. Spontaneous abortion, incomplete, complicated by delayed or excessive hemorrhage   2. Vaginal bleeding affecting early pregnancy   3. Syncope, unspecified syncope type   4. Symptomatic anemia   Possible dislocation of right shoulder Hx recurrent right shoulder dislocation  PLAN Prep for OR D&C   [place], [place], CNM 10/11/2017  12:12 PM

## 2017-10-11 NOTE — Anesthesia Procedure Notes (Signed)
Procedure Name: LMA Insertion Date/Time: 10/11/2017 1:38 PM Performed by: Rica Recordsickelton, River Ambrosio, CRNA Pre-anesthesia Checklist: Emergency Drugs available, Patient identified, Suction available and Patient being monitored Patient Re-evaluated:Patient Re-evaluated prior to induction Oxygen Delivery Method: Circle system utilized Preoxygenation: Pre-oxygenation with 100% oxygen Induction Type: IV induction Ventilation: Mask ventilation without difficulty LMA: LMA inserted LMA Size: 4.0 Number of attempts: 1 Dental Injury: Teeth and Oropharynx as per pre-operative assessment

## 2017-10-11 NOTE — Progress Notes (Signed)
Patient dislocated shoulder during syncopal episode, put out call to orthopedics who will come relocate shoulder. Patient subsequently reduced her shoulder herself, reviewed with Orthopedist on call who recommends sling and follow up in office, and call with any further issues.    Baldemar LenisK. Meryl Camela Wich, M.D. [facility]

## 2017-10-11 NOTE — H&P (Signed)
OB/GYN History and Physical  [name] is a 32 y.o. [id number] presenting for heavy bleeding with concern for spontaneous abortion. Had heavy bleeding at home and was symptomatic, came in.       Past Medical History:  Diagnosis Date  . Anemia   . Blood transfusion without reported diagnosis   . IBS (irritable bowel syndrome)    with constipation  . Shoulder dislocation, recurrent     Past Surgical History:  Procedure Laterality Date  . TUBAL LIGATION Bilateral 02/21/2017   Procedure: POST PARTUM TUBAL LIGATION;  Surgeon: Woodland BingPickens, Charlie, MD;  [facility];  Service: Gynecology;  Laterality: Bilateral;  . WISDOM TOOTH EXTRACTION      OB History  Gravida Para Term Preterm AB Living  5 4 4     4   SAB TAB Ectopic Multiple Live Births        0 4    # Outcome Date GA Lbr Len/2nd Weight Sex Delivery Anes PTL Lv  5 Current           4 Term 02/21/17 4132w6d  3609 g F Vag-Spont None  LIV  3 Term 12/02/08 6929w0d  3459 g M Vag-Spont None N LIV  2 Term 08/18/06 3829w0d  3289 g F Vag-Spont None Y LIV  1 Term 08/14/04 9329w0d  3430 g M Vag-Spont EPI N LIV    Social History   Socioeconomic History  . Marital status: Married    Spouse name: Not on file  . Number of children: Not on file  . Years of education: Not on file  . Highest education level: Not on file  Occupational History  . Not on file  Social Needs  . Financial resource strain: Not on file  . Food insecurity:    Worry: Not on file    Inability: Not on file  . Transportation needs:    Medical: Not on file    Non-medical: Not on file  Tobacco Use  . Smoking status: Former Smoker    Types: Cigarettes    Last attempt to quit: 07/22/2004    Years since quitting: 13.2  . Smokeless tobacco: Never Used  Substance and Sexual Activity  . Alcohol use: No    Comment: none since +UPT  . Drug use: No  . Sexual activity: Yes    Birth control/protection: None  Lifestyle  . Physical activity:    Days per  week: Not on file    Minutes per session: Not on file  . Stress: Not on file  Relationships  . Social connections:    Talks on phone: Not on file    Gets together: Not on file    Attends religious service: Not on file    Active member of club or organization: Not on file    Attends meetings of clubs or organizations: Not on file    Relationship status: Not on file  Other Topics Concern  . Not on file  Social History Narrative  . Not on file    Family History  Problem Relation Age of Onset  . Diabetes Sister   . Cancer Paternal Grandmother        breast  . Asthma Son   . Diabetes Maternal Aunt   . Cancer Paternal Aunt        cervical, breast  . Colon cancer Neg Hx     Medications Prior to Admission  Medication Sig Dispense Refill Last Dose  . ferrous sulfate 325 (65 FE) MG  tablet Take 1 tablet (325 mg total) by mouth 2 (two) times daily with a meal. 60 tablet 3 Past Month at Unknown time  . ibuprofen (ADVIL,MOTRIN) 600 MG tablet Take 1 tablet (600 mg total) by mouth every 6 (six) hours. 30 tablet 0   . norethindrone (MICRONOR,CAMILA,ERRIN) 0.35 MG tablet Take 1 tablet (0.35 mg total) by mouth daily. 1 Package 11     Allergies  Allergen Reactions  . Sulfa Antibiotics Rash    Review of Systems: Negative except for what is mentioned in HPI.     Physical Exam: BP (!) 78/53 (BP Location: Left Arm)   Pulse (!) 143   Temp 98.5 F (36.9 C) (Oral)   Resp 18  CONSTITUTIONAL: Well-developed, well-nourished female in mild distress.  HENT:  Normocephalic, atraumatic, External right and left ear normal. Oropharynx is clear and moist EYES: Conjunctivae and EOM are normal. Pupils are equal, round, and reactive to light. No scleral icterus.  NECK: Normal range of motion, supple, no masses SKIN: Skin is warm and dry. No rash noted. Not diaphoretic. No erythema. No pallor. NEUROLGIC: Alert and oriented to person, place, and time. Normal reflexes, muscle tone coordination. No  cranial nerve deficit noted. PSYCHIATRIC: Normal mood and affect. Normal behavior. Normal judgment and thought content. CARDIOVASCULAR: Normal heart rate noted, regular rhythm RESPIRATORY: Effort and breath sounds normal, no problems with respiration noted ABDOMEN: Soft, nontender, nondistended, gravid.  PELVIC: Deferred MUSCULOSKELETAL: normal range of motion. No edema and no tenderness. 2+ distal pulses.   Pertinent Labs/Studies:   Results for orders placed or performed during the hospital encounter of 10/11/17 (from the past 72 hour(s))  CBC     Status: Abnormal   Collection Time: 10/11/17  9:35 AM  Result Value Ref Range   WBC 4.3 4.0 - 10.5 K/uL   RBC 4.45 3.87 - 5.11 MIL/uL   Hemoglobin 9.7 (L) 12.0 - 15.0 g/dL   HCT 40.9 (L) 81.1 - 91.4 %   MCV 69.9 (L) 78.0 - 100.0 fL   MCH 21.8 (L) 26.0 - 34.0 pg   MCHC 31.2 30.0 - 36.0 g/dL   RDW 78.2 (H) 95.6 - 21.3 %   Platelets 181 150 - 400 K/uL    Comment: Performed at [facility], [address], [place], [place] [address]   CLINICAL DATA:  32 year old pregnant female with current heavy vaginal bleeding. Possible twin pregnancy identified on 10/07/2017 ultrasound  EXAM: TRANSVAGINAL OB ULTRASOUND  TECHNIQUE: Transvaginal ultrasound was performed for complete evaluation of the gestation as well as the maternal uterus, adnexal regions, and pelvic cul-de-sac.  COMPARISON:  10/07/2017  FINDINGS: Intrauterine gestational sac: Single but now located in the LOWER uterine segment/cervix and has a new elongated appearance.  Yolk sac:  Not Visualized.  Embryo:  Not Visualized.  Cardiac Activity: Not Visualized.  MSD: 23.2 mm   7 w   2  d  Subchorionic hemorrhage:  None visualized.  Maternal uterus/adnexae: The ovaries bilaterally are unremarkable. No free fluid or adnexal mass.  IMPRESSION: 1. Abnormal appearing intrauterine gestational sac now lies in the LOWER uterine segment/cervix and now without  identifiable yolk sac or embryo, almost certainly representing abortion in progress/nonviable 1st trimester pregnancy given history. Follow-up as clinically indicated.   Electronically Signed   By: Harmon Pier M.D.   On: 10/11/2017 10:53     Assessment and Plan :[name] is a 32 y.o. [id number] presenting for heavy bleeding today in early pregnancy. [place] in MAU showed likely SAB,  no embryos seen today on US where they had been seen previously. She improved with IVFs and in the process of discussion medical vs surgical management, patient had a significant vaginal bleed at which point decision made to proceed with D&C. Immediately after this, I was called to bed urgently for syncopal episode, patient reported feeling dizzy. No seizure activity, no post ictal symptoms although she thinks her should dislocated in the turn (happens frequently).  Briefly reviewed recommendation for surgical management, suction D&E. Briefly reviewed risks including infection, hemorrhage, damage to surrounding tissue and organs, need for other procedures, she consents to blood transfusion. Will proceed urgently with suction D&E.   Plan for suction D&E NPO Admission labs ordered VS Q4 IV doxycycline   K. Therese SarahMeryl Natanael Saladin, M.D. Attending Obstetrician & Gynecologist, [facility] Center for [employer] [facility], [facility]

## 2017-10-11 NOTE — MAU Note (Signed)
Has been bleeding and it has gotten heavier last night, reports wearing a tampon and 2 pads and soaking through them in 10 min  Reports multiple large clots  Feeling dizzy  Back and abdominal pain

## 2017-10-11 NOTE — MAU Note (Signed)
NT at bedside doing orthostatic vital signs, calls out for assistance from RN for syncopal episode. Elvina MattesSmith CNM, Neill CNM, and Dr Earlene Plateravis brought to bedside to assist. Pt came to with ammonia packet. 2nd PIV started in R hand with LR bolus infusing.

## 2017-10-11 NOTE — Anesthesia Preprocedure Evaluation (Addendum)
Anesthesia Evaluation  Patient identified by MRN, date of birth, ID band Patient awake    Reviewed: Allergy & Precautions, NPO status , Patient's Chart, lab work & pertinent test results  Airway Mallampati: II  TM Distance: >3 FB Neck ROM: Full    Dental no notable dental hx. (+) Teeth Intact   Pulmonary former smoker,    Pulmonary exam normal breath sounds clear to auscultation       Cardiovascular negative cardio ROS Normal cardiovascular exam Rhythm:Regular Rate:Normal     Neuro/Psych PSYCHIATRIC DISORDERS negative neurological ROS     GI/Hepatic Neg liver ROS, Hx/o IBS   Endo/Other  Obesity  Renal/GU negative Renal ROS  negative genitourinary   Musculoskeletal Recurrent right shoulder dislocation   Abdominal (+) + obese,   Peds  Hematology  (+) anemia ,   Anesthesia Other Findings   Reproductive/Obstetrics Incomplete Ab                             Anesthesia Physical Anesthesia Plan  ASA: II  Anesthesia Plan: General   Post-op Pain Management:    Induction: Intravenous and Cricoid pressure planned  PONV Risk Score and Plan: 4 or greater and Midazolam, Scopolamine patch - Pre-op, Dexamethasone, Ondansetron and Treatment may vary due to age or medical condition  Airway Management Planned: Oral ETT  Additional Equipment:   Intra-op Plan:   Post-operative Plan: Extubation in OR  Informed Consent: I have reviewed the patients History and Physical, chart, labs and discussed the procedure including the risks, benefits and alternatives for the proposed anesthesia with the patient or authorized representative who has indicated his/her understanding and acceptance.   Dental advisory given  Plan Discussed with: CRNA and Surgeon  Anesthesia Plan Comments:         Anesthesia Quick Evaluation

## 2017-10-11 NOTE — Transfer of Care (Signed)
Immediate Anesthesia Transfer of Care Note  Patient: [name]  Procedure(s) Performed: suction dilation and evacuation (N/A Vagina )  Patient Location: PACU  Anesthesia Type:General  Level of Consciousness: awake, alert  and oriented  Airway & Oxygen Therapy: Patient Spontanous Breathing and Patient connected to nasal cannula oxygen  Post-op Assessment: Report given to RN and Post -op Vital signs reviewed and stable  Post vital signs: Reviewed and stable  Last Vitals:  Vitals Value Taken Time  BP 98/53 10/11/2017  2:30 PM  Temp 36.6 C 10/11/2017  2:21 PM  Pulse 61 10/11/2017  2:33 PM  Resp 11 10/11/2017  2:33 PM  SpO2 100 % 10/11/2017  2:33 PM  Vitals shown include unvalidated device data.  Last Pain:  Vitals:   10/11/17 1415  TempSrc:   PainSc: 0-No pain         Complications: No apparent anesthesia complications

## 2017-10-12 ENCOUNTER — Encounter (HOSPITAL_COMMUNITY): Payer: Self-pay | Admitting: Obstetrics and Gynecology

## 2017-10-12 NOTE — Addendum Note (Signed)
Addendum  created 10/12/17 1844 by Algis GreenhouseBurger, Amberlee Garvey A, CRNA   Charge Capture section accepted

## 2017-10-15 LAB — TYPE AND SCREEN
ABO/RH(D): O POS
Antibody Screen: NEGATIVE
DONOR AG TYPE: NEGATIVE
Donor AG Type: NEGATIVE
Donor AG Type: NEGATIVE
Donor AG Type: NEGATIVE
UNIT DIVISION: 0
UNIT DIVISION: 0
UNIT DIVISION: 0
Unit division: 0

## 2017-10-15 LAB — BPAM RBC
BLOOD PRODUCT EXPIRATION DATE: 201909062359
BLOOD PRODUCT EXPIRATION DATE: 201909072359
BLOOD PRODUCT EXPIRATION DATE: 201909162359
BLOOD PRODUCT EXPIRATION DATE: 201909222359
ISSUE DATE / TIME: 201908181348
UNIT TYPE AND RH: 9500
Unit Type and Rh: 9500
Unit Type and Rh: 9500
Unit Type and Rh: 9500

## 2018-02-24 NOTE — L&D Delivery Note (Signed)
OB/GYN Faculty Practice Delivery Note  [name] is a 33 y.o. [phone] s/p SVD with di/di twins at [redacted]w[redacted]d. She was admitted for SOL.   ROM: 2h 60m with clear fluid; second bag ruptured just prior to delivery of [name] with clear fluid GBS Status: Unknown and preterm; adequate ppx with PCN   Maximum Maternal Temperature: 98.69F  Labor Progress: . Patient presented to L&D for SOL. Initial SVE: 6/70/-3. Patient had AROM and no further augmentation. She then progressed to complete.   Delivery Date/Time: 11/14: Baby A at 0240 and [name] at 0248 Delivery: Called to room and patient was complete. Baby A delivered easily in LOA position. No nuchal cord. Left compound hand present. Shoulder and body delivered in usual fashion. Infant with spontaneous cry, placed on mother's abdomen, dried and stimulated. Cord clamped x 2 after 1-minute delay, and cut by MGM. Ultrasound applied abdominally. [name] in breech position. Feet grabbed and AROM of [name] bag with contraction. With maternal effort and traction on double footling presentation, delivered to shoulders and both arms swept down and then fetal head easily followed with pressure on maxilla. Cord blood collected x2. Placentas delivered spontaneously with gentle cord traction. Fundus firm with massage and Pitocin. Labia, perineum, vagina, and cervix inspected inspected with 1st degree laceration which was repaired with 4-0 Vicryl in a standard fashion. Counts correct. [name]  Placenta: Sent to L&D Complications: None Lacerations: 1st degree laceration EBL: 123 mL Analgesia: Epidural   Infant: APGAR (1 MIN):    [name], Limb [[id number]]  [address] [facility] [[id number]]     [name], [name] [[id number]]  8    APGAR (5 MINS):    [name], [name] [[id number]]  [address], [place] [[id number]]     [name], [name] [[id number]]  Sims, MD [facility] Fellow, [facility] for [facility], [facility] 01/08/2019, 3:13 AM

## 2018-06-03 ENCOUNTER — Encounter: Payer: Self-pay | Admitting: *Deleted

## 2018-06-03 ENCOUNTER — Telehealth: Payer: Self-pay | Admitting: *Deleted

## 2018-06-03 NOTE — Telephone Encounter (Signed)
Patient called stating she took a pregnancy test at home and it said positive. Please advise

## 2018-06-07 ENCOUNTER — Ambulatory Visit (INDEPENDENT_AMBULATORY_CARE_PROVIDER_SITE_OTHER): Payer: Medicaid Other | Admitting: Adult Health

## 2018-06-07 ENCOUNTER — Encounter: Payer: Self-pay | Admitting: Adult Health

## 2018-06-07 ENCOUNTER — Other Ambulatory Visit: Payer: Self-pay

## 2018-06-07 VITALS — Ht 64.0 in | Wt 165.0 lb

## 2018-06-07 DIAGNOSIS — Z3201 Encounter for pregnancy test, result positive: Secondary | ICD-10-CM

## 2018-06-07 DIAGNOSIS — O3680X0 Pregnancy with inconclusive fetal viability, not applicable or unspecified: Secondary | ICD-10-CM

## 2018-06-07 DIAGNOSIS — Z3A01 Less than 8 weeks gestation of pregnancy: Secondary | ICD-10-CM

## 2018-06-07 MED ORDER — PRENATAL PLUS 27-1 MG PO TABS: 1.0000 | ORAL_TABLET | Freq: Every day | ORAL | 12 refills | Status: DC

## 2018-06-07 NOTE — Progress Notes (Signed)
Patient ID: [name], female   DOB: 05/30/1985, 33 y.o.   MRN: [id number]   TELEHEALTH VIRTUAL GYNECOLOGY VISIT ENCOUNTER NOTE  I connected with Tracey AdeShemeka T Inge on 06/07/18 at  1:30 PM EDT by telephone at home and verified that I am speaking with the correct person using two identifiers.   I discussed the limitations, risks, security and privacy concerns of performing an evaluation and management service by telephone and the availability of in person appointments. I also discussed with the patient that there may be a patient responsible charge related to this service. The patient expressed understanding and agreed to proceed.   History:  Tracey AdeShemeka T Hegeman is a 33 y.o. [phone] female being evaluated today for having missed a period and had 1+HPT.She is about 6 weeks by LMP with EDD 01/31/2019. She denies any abnormal vaginal discharge, bleeding, pelvic pain.  +nausea and some constipation, but uses Linzess, occasionally for this, and it works well.  Has craved flour. Fall risk is low.  PCP is [facility] Muse,[facility].      Past Medical History:  Diagnosis Date  . Anemia   . Blood transfusion without reported diagnosis   . IBS (irritable bowel syndrome)    with constipation  . Shoulder dislocation, recurrent    Past Surgical History:  Procedure Laterality Date  . DILATION AND CURETTAGE OF UTERUS  10/11/2017   incomplete abortion  . DILATION AND EVACUATION N/A 10/11/2017   Procedure: suction dilation and evacuation;  Surgeon: Conan Bowensavis, Kelly M, MD;  Location: [facility];  Service: Gynecology;  Laterality: N/A;  . TUBAL LIGATION Bilateral 02/21/2017   Procedure: POST PARTUM TUBAL LIGATION;  Surgeon: Magoffin BingPickens, Charlie, MD;  [facility];  Service: Gynecology;  Laterality: Bilateral;  . WISDOM TOOTH EXTRACTION     The following portions of the patient's history were reviewed and updated as appropriate: allergies, current medications, past family history, past medical history, past social  history, past surgical history and problem list.   Health Maintenance:  Normal pap and negative HRHPV on 05/07/15.  Review of Systems:  Pertinent items noted in HPI and remainder of comprehensive ROS otherwise negative.  Physical Exam:   General:  Alert, oriented and cooperative.   Mental Status: Normal mood and affect perceived. Normal judgment and thought content.  Physical exam deferred due to nature of the encounter  Labs and Imaging No results found for this or any previous visit (from the past 336 hour(s)). No results found.    Assessment and Plan:     1. Positive pregnancy test -1+HPT  2. Less than [redacted] weeks gestation of pregnancy  -OK to take OTC iron too    Meds ordered this encounter  Medications  . prenatal vitamin w/FE, FA (PRENATAL 1 + 1) 27-1 MG TABS tablet    Sig: Take 1 tablet by mouth daily at 12 noon.    Dispense:  30 each    Refill:  12    Order Specific Question:   Supervising Provider    Answer:   Lazaro ArmsEURE, LUTHER H [[id number]]  -Linzess is fine to take.   3. Encounter to determine fetal viability of pregnancy, single or unspecified fetus - US OB Comp Less 14 Wks; Future       I discussed the assessment and treatment plan with the patient. The patient was provided an opportunity to ask questions and all were answered. The patient agreed with the plan and demonstrated an understanding of the instructions.   The patient was  advised to call back or seek an in-person evaluation/go to the ED if the symptoms worsen or if the condition fails to improve as anticipated.  I provided 9 minutes of non-face-to-face time during this encounter.   Cyril Mourning, NP Center for [employer], [facility]

## 2018-06-28 ENCOUNTER — Other Ambulatory Visit: Payer: Self-pay | Admitting: Adult Health

## 2018-06-28 ENCOUNTER — Ambulatory Visit (INDEPENDENT_AMBULATORY_CARE_PROVIDER_SITE_OTHER): Payer: Medicaid Other

## 2018-06-28 ENCOUNTER — Other Ambulatory Visit: Payer: Self-pay

## 2018-06-28 DIAGNOSIS — O30001 Twin pregnancy, unspecified number of placenta and unspecified number of amniotic sacs, first trimester: Secondary | ICD-10-CM | POA: Diagnosis not present

## 2018-06-28 DIAGNOSIS — O3680X0 Pregnancy with inconclusive fetal viability, not applicable or unspecified: Secondary | ICD-10-CM | POA: Diagnosis not present

## 2018-06-28 DIAGNOSIS — Z3A09 9 weeks gestation of pregnancy: Secondary | ICD-10-CM | POA: Diagnosis not present

## 2018-06-28 NOTE — Progress Notes (Signed)
DI/DI TWINS 9 wks,normal ovaries bilat BABY A right,anterior placenta gr 0,fhr 155 bpm,crl 29.79 mm [name] left,anterior placenta gr 0,fhr 153 bpm,crl 29.44 mm

## 2018-07-08 ENCOUNTER — Other Ambulatory Visit: Payer: Self-pay | Admitting: Adult Health

## 2018-07-08 MED ORDER — OMEPRAZOLE 10 MG PO CPDR: 10.0000 mg | DELAYED_RELEASE_CAPSULE | Freq: Every day | ORAL | 3 refills | Status: DC

## 2018-07-08 MED ORDER — PROMETHAZINE HCL 25 MG PO TABS: 25.0000 mg | ORAL_TABLET | Freq: Four times a day (QID) | ORAL | 1 refills | Status: DC | PRN

## 2018-07-08 NOTE — Progress Notes (Signed)
rx phenergan and Prilosec

## 2018-07-21 ENCOUNTER — Other Ambulatory Visit: Payer: Self-pay | Admitting: Obstetrics and Gynecology

## 2018-07-21 DIAGNOSIS — Z3682 Encounter for antenatal screening for nuchal translucency: Secondary | ICD-10-CM

## 2018-07-21 DIAGNOSIS — O30002 Twin pregnancy, unspecified number of placenta and unspecified number of amniotic sacs, second trimester: Secondary | ICD-10-CM

## 2018-07-22 ENCOUNTER — Encounter: Payer: Medicaid Other | Admitting: Women's Health

## 2018-07-22 ENCOUNTER — Other Ambulatory Visit: Payer: Medicaid Other

## 2018-07-28 ENCOUNTER — Other Ambulatory Visit: Payer: Medicaid Other

## 2018-07-28 ENCOUNTER — Ambulatory Visit (INDEPENDENT_AMBULATORY_CARE_PROVIDER_SITE_OTHER): Payer: Medicaid Other

## 2018-07-28 ENCOUNTER — Other Ambulatory Visit: Payer: Self-pay

## 2018-07-28 ENCOUNTER — Other Ambulatory Visit: Payer: Self-pay | Admitting: Obstetrics & Gynecology

## 2018-07-28 DIAGNOSIS — O30002 Twin pregnancy, unspecified number of placenta and unspecified number of amniotic sacs, second trimester: Secondary | ICD-10-CM | POA: Diagnosis not present

## 2018-07-28 DIAGNOSIS — Z3682 Encounter for antenatal screening for nuchal translucency: Secondary | ICD-10-CM

## 2018-07-28 DIAGNOSIS — Z3A13 13 weeks gestation of pregnancy: Secondary | ICD-10-CM

## 2018-07-28 NOTE — Progress Notes (Signed)
[place] 13+2 wks,DI/DI twins,normal ovaries bilat BABY A: inferior right,anterior placenta gr 0,NB present,NT 1.9 mm,fhr 143 BPM,CRL 79.71 mm [name]:superior left,anterior placenta gr 0,NB present,NT 1.9 mm,fhr 144 BPM,CRL 81.07 mm

## 2018-07-30 ENCOUNTER — Encounter: Payer: Self-pay | Admitting: *Deleted

## 2018-08-02 ENCOUNTER — Other Ambulatory Visit: Payer: Self-pay

## 2018-08-02 ENCOUNTER — Other Ambulatory Visit (HOSPITAL_COMMUNITY)
Admission: RE | Admit: 2018-08-02 | Discharge: 2018-08-02 | Disposition: A | Discharge status: Home / Self Care | Payer: Medicaid Other | Source: Ambulatory Visit | Attending: Obstetrics & Gynecology | Admitting: Obstetrics & Gynecology

## 2018-08-02 ENCOUNTER — Encounter: Payer: Self-pay | Admitting: Women's Health

## 2018-08-02 ENCOUNTER — Ambulatory Visit: Payer: Medicaid Other | Admitting: *Deleted

## 2018-08-02 ENCOUNTER — Ambulatory Visit (INDEPENDENT_AMBULATORY_CARE_PROVIDER_SITE_OTHER): Payer: Medicaid Other | Admitting: Women's Health

## 2018-08-02 VITALS — BP 136/70 | HR 98 | Wt 148.5 lb

## 2018-08-02 DIAGNOSIS — O0990 Supervision of high risk pregnancy, unspecified, unspecified trimester: Secondary | ICD-10-CM | POA: Insufficient documentation

## 2018-08-02 DIAGNOSIS — O30042 Twin pregnancy, dichorionic/diamniotic, second trimester: Secondary | ICD-10-CM

## 2018-08-02 DIAGNOSIS — Z1389 Encounter for screening for other disorder: Secondary | ICD-10-CM

## 2018-08-02 DIAGNOSIS — O0992 Supervision of high risk pregnancy, unspecified, second trimester: Secondary | ICD-10-CM | POA: Diagnosis not present

## 2018-08-02 DIAGNOSIS — Z3A14 14 weeks gestation of pregnancy: Secondary | ICD-10-CM | POA: Diagnosis not present

## 2018-08-02 DIAGNOSIS — Z331 Pregnant state, incidental: Secondary | ICD-10-CM

## 2018-08-02 DIAGNOSIS — Z1379 Encounter for other screening for genetic and chromosomal anomalies: Secondary | ICD-10-CM | POA: Diagnosis not present

## 2018-08-02 DIAGNOSIS — O30049 Twin pregnancy, dichorionic/diamniotic, unspecified trimester: Secondary | ICD-10-CM | POA: Insufficient documentation

## 2018-08-02 LAB — POCT URINALYSIS DIPSTICK OB
Blood, UA: NEGATIVE
Glucose, UA: NEGATIVE
Ketones, UA: NEGATIVE
Leukocytes, UA: NEGATIVE
Nitrite, UA: NEGATIVE

## 2018-08-02 NOTE — Patient Instructions (Signed)
[name], I greatly value your feedback.  If you receive a survey following your visit with [place] today, we appreciate you taking the time to fill it out.  Thanks, Knute Neu, CNM, [facility]  [name]!!! It is now [name] ([place], [place] [address]) Entrance located off of [address] parking   Home Blood Pressure Monitoring for Patients   Your provider has recommended that you check your blood pressure (BP) at least once a week at home. If you do not have a blood pressure cuff at home, one will be provided for you. Contact your provider if you have not received your monitor within 1 week.   Helpful Tips for Accurate Home Blood Pressure Checks  . Don't smoke, exercise, or drink caffeine 30 minutes before checking your BP . Use the restroom before checking your BP (a full bladder can raise your pressure) . Relax in a comfortable upright chair . Feet on the ground . Left arm resting comfortably on a flat surface at the level of your heart . Legs uncrossed . Back supported . Sit quietly and don't talk . Place the cuff on your bare arm . Adjust snuggly, so that only two fingertips can fit between your skin and the top of the cuff . Check 2 readings separated by at least one minute . Keep a log of your BP readings . For a visual, please reference this diagram: http://ccnc.care/bpdiagram  Provider Name: [facility]     Phone: [phone]  Zone 1: ALL CLEAR  Continue to monitor your symptoms:  . BP reading is less than 140 (top number) or less than 90 (bottom number)  . No right upper stomach pain . No headaches or seeing spots . No feeling nauseated or throwing up . No swelling in face and hands  Zone 2: CAUTION Call your doctor's office for any of the following:  . BP reading is greater than 140 (top number) or greater than 90 (bottom number)  . Stomach pain under your ribs in the middle  or right side . Headaches or seeing spots . Feeling nauseated or throwing up . Swelling in face and hands  Zone 3: EMERGENCY  Seek immediate medical care if you have any of the following:  . BP reading is greater than160 (top number) or greater than 110 (bottom number) . Severe headaches not improving with Tylenol . Serious difficulty catching your breath . Any worsening symptoms from Zone 2     Second Trimester of Pregnancy The second trimester is from week 14 through week 27 (months 4 through 6). The second trimester is often a time when you feel your best. Your body has adjusted to being pregnant, and you begin to feel better physically. Usually, morning sickness has lessened or quit completely, you may have more energy, and you may have an increase in appetite. The second trimester is also a time when the fetus is growing rapidly. At the end of the sixth month, the fetus is about 9 inches long and weighs about 1 pounds. You will likely begin to feel the baby move (quickening) between 16 and 20 weeks of pregnancy. Body changes during your second trimester Your body continues to go through many changes during your second trimester. The changes vary from woman to woman.  Your weight will continue to increase. You will notice your lower abdomen bulging out.  You may begin to get  stretch marks on your hips, abdomen, and breasts.  You may develop headaches that can be relieved by medicines. The medicines should be approved by your health care provider.  You may urinate more often because the fetus is pressing on your bladder.  You may develop or continue to have heartburn as a result of your pregnancy.  You may develop constipation because certain hormones are causing the muscles that push waste through your intestines to slow down.  You may develop hemorrhoids or swollen, bulging veins (varicose veins).  You may have back pain. This is caused by: ? Weight gain. ? Pregnancy hormones  that are relaxing the joints in your pelvis. ? A shift in weight and the muscles that support your balance.  Your breasts will continue to grow and they will continue to become tender.  Your gums may bleed and may be sensitive to brushing and flossing.  Dark spots or blotches (chloasma, mask of pregnancy) may develop on your face. This will likely fade after the baby is born.  A dark line from your belly button to the pubic area (linea nigra) may appear. This will likely fade after the baby is born.  You may have changes in your hair. These can include thickening of your hair, rapid growth, and changes in texture. Some women also have hair loss during or after pregnancy, or hair that feels dry or thin. Your hair will most likely return to normal after your baby is born.  What to expect at prenatal visits During a routine prenatal visit:  You will be weighed to make sure you and the fetus are growing normally.  Your blood pressure will be taken.  Your abdomen will be measured to track your baby's growth.  The fetal heartbeat will be listened to.  Any test results from the previous visit will be discussed.  Your health care provider may ask you:  How you are feeling.  If you are feeling the baby move.  If you have had any abnormal symptoms, such as leaking fluid, bleeding, severe headaches, or abdominal cramping.  If you are using any tobacco products, including cigarettes, chewing tobacco, and electronic cigarettes.  If you have any questions.  Other tests that may be performed during your second trimester include:  Blood tests that check for: ? Low iron levels (anemia). ? High blood sugar that affects pregnant women (gestational diabetes) between 85 and 28 weeks. ? Rh antibodies. This is to check for a protein on red blood cells (Rh factor).  Urine tests to check for infections, diabetes, or protein in the urine.  An ultrasound to confirm the proper growth and  development of the baby.  An amniocentesis to check for possible genetic problems.  Fetal screens for spina bifida and Down syndrome.  HIV (human immunodeficiency virus) testing. Routine prenatal testing includes screening for HIV, unless you choose not to have this test.  Follow these instructions at home: Medicines  Follow your health care provider's instructions regarding medicine use. Specific medicines may be either safe or unsafe to take during pregnancy.  Take a prenatal vitamin that contains at least 600 micrograms (mcg) of folic acid.  If you develop constipation, try taking a stool softener if your health care provider approves. Eating and drinking  Eat a balanced diet that includes fresh fruits and vegetables, whole grains, good sources of protein such as meat, eggs, or tofu, and low-fat dairy. Your health care provider will help you determine the amount of weight gain that  is right for you.  Avoid raw meat and uncooked cheese. These carry germs that can cause birth defects in the baby.  If you have low calcium intake from food, talk to your health care provider about whether you should take a daily calcium supplement.  Limit foods that are high in fat and processed sugars, such as fried and sweet foods.  To prevent constipation: ? Drink enough fluid to keep your urine clear or pale yellow. ? Eat foods that are high in fiber, such as fresh fruits and vegetables, whole grains, and beans. Activity  Exercise only as directed by your health care provider. Most women can continue their usual exercise routine during pregnancy. Try to exercise for 30 minutes at least 5 days a week. Stop exercising if you experience uterine contractions.  Avoid heavy lifting, wear low heel shoes, and practice good posture.  A sexual relationship may be continued unless your health care provider directs you otherwise. Relieving pain and discomfort  Wear a good support bra to prevent discomfort  from breast tenderness.  Take warm sitz baths to soothe any pain or discomfort caused by hemorrhoids. Use hemorrhoid cream if your health care provider approves.  Rest with your legs elevated if you have leg cramps or low back pain.  If you develop varicose veins, wear support hose. Elevate your feet for 15 minutes, 3-4 times a day. Limit salt in your diet. Prenatal Care  Write down your questions. Take them to your prenatal visits.  Keep all your prenatal visits as told by your health care provider. This is important. Safety  Wear your seat belt at all times when driving.  Make a list of emergency phone numbers, including numbers for family, friends, the hospital, and police and fire departments. General instructions  Ask your health care provider for a referral to a local prenatal education class. Begin classes no later than the beginning of month 6 of your pregnancy.  Ask for help if you have counseling or nutritional needs during pregnancy. Your health care provider can offer advice or refer you to specialists for help with various needs.  Do not use hot tubs, steam rooms, or saunas.  Do not douche or use tampons or scented sanitary pads.  Do not cross your legs for long periods of time.  Avoid cat litter boxes and soil used by cats. These carry germs that can cause birth defects in the baby and possibly loss of the fetus by miscarriage or stillbirth.  Avoid all smoking, herbs, alcohol, and unprescribed drugs. Chemicals in these products can affect the formation and growth of the baby.  Do not use any products that contain nicotine or tobacco, such as cigarettes and e-cigarettes. If you need help quitting, ask your health care provider.  Visit your dentist if you have not gone yet during your pregnancy. Use a soft toothbrush to brush your teeth and be gentle when you floss. Contact a health care provider if:  You have dizziness.  You have mild pelvic cramps, pelvic  pressure, or nagging pain in the abdominal area.  You have persistent nausea, vomiting, or diarrhea.  You have a bad smelling vaginal discharge.  You have pain when you urinate. Get help right away if:  You have a fever.  You are leaking fluid from your vagina.  You have spotting or bleeding from your vagina.  You have severe abdominal cramping or pain.  You have rapid weight gain or weight loss.  You have shortness of breath  with chest pain.  You notice sudden or extreme swelling of your face, hands, ankles, feet, or legs.  You have not felt your baby move in over an hour.  You have severe headaches that do not go away when you take medicine.  You have vision changes. Summary  The second trimester is from week 14 through week 27 (months 4 through 6). It is also a time when the fetus is growing rapidly.  Your body goes through many changes during pregnancy. The changes vary from woman to woman.  Avoid all smoking, herbs, alcohol, and unprescribed drugs. These chemicals affect the formation and growth your baby.  Do not use any tobacco products, such as cigarettes, chewing tobacco, and e-cigarettes. If you need help quitting, ask your health care provider.  Contact your health care provider if you have any questions. Keep all prenatal visits as told by your health care provider. This is important. This information is not intended to replace advice given to you by your health care provider. Make sure you discuss any questions you have with your health care provider. Document Released: 02/04/2001 Document Revised: 07/19/2015 Document Reviewed: 04/13/2012 Elsevier Interactive Patient Education  2017 [employer].

## 2018-08-02 NOTE — Progress Notes (Signed)
INITIAL OBSTETRICAL VISIT Patient name: Tracey AdeShemeka T Foxworthy MRN [id number]  Date of birth: 08/04/1985 Chief Complaint:   Initial Prenatal Visit  History of Present Illness:   [name] is a 33 y.o. [id number] African American female at 5011w0d by LMP c/w 9wk u/s, with an Estimated Date of Delivery: 01/31/19 being seen today for her initial obstetrical visit.   Her obstetrical history is significant for term uncomplicated SVB x 4, 6wk incomplete SAB w/ twins w/ hemorrhage requiring D&C and blood transfusions.   Today she reports eating flour again as she did in previous pregnancies Patient's last menstrual period was 04/26/2018. Last pap 05/07/15. Results were: normal Review of Systems:   Pertinent items are noted in HPI Denies cramping/contractions, leakage of fluid, vaginal bleeding, abnormal vaginal discharge w/ itching/odor/irritation, headaches, visual changes, shortness of breath, chest pain, abdominal pain, severe nausea/vomiting, or problems with urination or bowel movements unless otherwise stated above.  Pertinent History Reviewed:  Reviewed past medical,surgical, social, obstetrical and family history.  Reviewed problem list, medications and allergies. OB History  Gravida Para Term Preterm AB Living  6 4 4   1 4   SAB TAB Ectopic Multiple Live Births  1     0 4    # Outcome Date GA Lbr Len/2nd Weight Sex Delivery Anes PTL Lv  6 Current           5 SAB 10/11/17 453w3d         4 Term 02/21/17 7242w6d  7 lb 15.3 oz (3.609 kg) F Vag-Spont None  LIV  3 Term 12/02/08 2275w0d  7 lb 10 oz (3.459 kg) M Vag-Spont None N LIV  2 Term 08/18/06 2975w0d  7 lb 4 oz (3.289 kg) F Vag-Spont None Y LIV  1 Term 08/14/04 4075w0d  7 lb 9 oz (3.43 kg) M Vag-Spont EPI N LIV   Physical Assessment:   Vitals:   08/02/18 1411  BP: 136/70  Pulse: 98  Weight: 148 lb 8 oz (67.4 kg)  Body mass index is 25.49 kg/m.       Physical Examination:  General appearance - well appearing, and in no distress  Mental status  - alert, oriented to person, place, and time  Psych:  She has a normal mood and affect  Skin - warm and dry, normal color, no suspicious lesions noted  Chest - effort normal, all lung fields clear to auscultation bilaterally  Heart - normal rate and regular rhythm  Abdomen - soft, nontender  Extremities:  No swelling or varicosities noted  Pelvic - VULVA: normal appearing vulva with no masses, tenderness or lesions  VAGINA: normal appearing vagina with normal color and discharge, no lesions  CERVIX: normal appearing cervix without discharge or lesions, no CMT  Thin prep pap is done w/ HR HPV cotesting  Fetal Heart Rate (bpm): + x 2 u/s via informal transabdominal u/s, +active fetus x 2  Results for orders placed or performed in visit on 08/02/18 (from the past 24 hour(s))  POC Urinalysis Dipstick OB   Collection Time: 08/02/18  2:26 PM  Result Value Ref Range   Color, UA     Clarity, UA     Glucose, UA Negative Negative   Bilirubin, UA     Ketones, UA neg    Spec Grav, UA     Blood, UA neg    pH, UA     POC,PROTEIN,UA Trace Negative, Trace, Small (1+), Moderate (2+), Large (3+), 4+   Urobilinogen, UA  Nitrite, UA neg    Leukocytes, UA Negative Negative   Appearance     Odor      Assessment & Plan:  1) High-Risk Pregnancy [id number] at [redacted]w[redacted]d with an Estimated Date of Delivery: 01/31/19   2) Initial OB visit  3) Di-Di twin gestation  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   Initial labs obtained Continue prenatal vitamins Reviewed n/v relief measures and warning s/s to report Reviewed recommended weight gain based on pre-gravid BMI Encouraged well-balanced diet Genetic Screening discussed: requested nt/it, maternit21 Cystic fibrosis, SMA, Fragile X screening discussed declined Ultrasound discussed; fetal survey: requested CCNC completed>PCM not here, form faxed Has home bp cuff. Check bp weekly, let  us know if >140/90.   Follow-up: Return in about 4 weeks (around 08/30/2018) for 2nd IT, Twins, YB:WLSLHTD, HROB.   Orders Placed This Encounter  Procedures  . Urine Culture  . US OB Comp + 14 Wk  . US OB DETAIL ADDL GEST + 14 WK  . Obstetric Panel, Including HIV  . Urinalysis, Routine w reflex microscopic  . MaterniT 21 plus Core, Blood  . Pain Management Screening Profile (10S)  . POC Urinalysis Dipstick OB    Roma Schanz CNM, [facility] 08/02/2018 4:50 PM

## 2018-08-03 ENCOUNTER — Encounter: Payer: Self-pay | Admitting: *Deleted

## 2018-08-03 ENCOUNTER — Other Ambulatory Visit: Payer: Self-pay | Admitting: Women's Health

## 2018-08-03 ENCOUNTER — Encounter: Payer: Self-pay | Admitting: Women's Health

## 2018-08-03 ENCOUNTER — Other Ambulatory Visit: Payer: Self-pay | Admitting: *Deleted

## 2018-08-03 DIAGNOSIS — O99019 Anemia complicating pregnancy, unspecified trimester: Secondary | ICD-10-CM | POA: Insufficient documentation

## 2018-08-03 DIAGNOSIS — O0992 Supervision of high risk pregnancy, unspecified, second trimester: Secondary | ICD-10-CM

## 2018-08-03 LAB — PMP SCREEN PROFILE (10S), URINE
Amphetamine Scrn, Ur: NEGATIVE ng/mL
BARBITURATE SCREEN URINE: NEGATIVE ng/mL
BENZODIAZEPINE SCREEN, URINE: NEGATIVE ng/mL
CANNABINOIDS UR QL SCN: NEGATIVE ng/mL
Cocaine (Metab) Scrn, Ur: NEGATIVE ng/mL
Creatinine(Crt), U: 145.7 mg/dL (ref 20.0–300.0)
Methadone Screen, Urine: NEGATIVE ng/mL
OXYCODONE+OXYMORPHONE UR QL SCN: NEGATIVE ng/mL
Opiate Scrn, Ur: NEGATIVE ng/mL
Ph of Urine: 6.2 (ref 4.5–8.9)
Phencyclidine Qn, Ur: NEGATIVE ng/mL
Propoxyphene Scrn, Ur: NEGATIVE ng/mL

## 2018-08-03 LAB — MATERNIT 21 PLUS CORE, BLOOD

## 2018-08-03 MED ORDER — FERROUS SULFATE 325 (65 FE) MG PO TABS: 325.0000 mg | ORAL_TABLET | Freq: Two times a day (BID) | ORAL | 3 refills | Status: DC

## 2018-08-04 ENCOUNTER — Telehealth: Payer: Self-pay | Admitting: *Deleted

## 2018-08-04 ENCOUNTER — Encounter: Payer: Self-pay | Admitting: *Deleted

## 2018-08-04 LAB — URINE CULTURE

## 2018-08-04 NOTE — Telephone Encounter (Signed)
Pt aware of Kim's recommendations and voiced understanding. JSY 

## 2018-08-04 NOTE — Telephone Encounter (Signed)
-----   Message from Roma Schanz, [place] sent at 08/04/2018  3:13 PM EDT ----- Please let her know that she is very anemic, I have sent in rx for fe to her pharmacy to take twice daily with OJ, make sure she's taking pnv daily, increase fe-rich foods: red meats, green leafy vegs, beans, etc. Thanks!

## 2018-08-04 NOTE — Telephone Encounter (Signed)
Mail box full @ 3:28 pm. Sent MyChart message. [name]

## 2018-08-05 LAB — CYTOLOGY - PAP
Chlamydia: NEGATIVE
Diagnosis: NEGATIVE
Neisseria Gonorrhea: NEGATIVE

## 2018-08-07 LAB — URINALYSIS, ROUTINE W REFLEX MICROSCOPIC
Bilirubin, UA: NEGATIVE
Glucose, UA: NEGATIVE
Leukocytes,UA: NEGATIVE
Nitrite, UA: NEGATIVE
RBC, UA: NEGATIVE
Specific Gravity, UA: 1.028 (ref 1.005–1.030)
Urobilinogen, Ur: 1 mg/dL (ref 0.2–1.0)
pH, UA: 6.5 (ref 5.0–7.5)

## 2018-08-07 LAB — OBSTETRIC PANEL, INCLUDING HIV
Antibody Screen: NEGATIVE
Basophils Absolute: 0 10*3/uL (ref 0.0–0.2)
Basos: 0 %
EOS (ABSOLUTE): 0.2 10*3/uL (ref 0.0–0.4)
Eos: 1 %
HIV Screen 4th Generation wRfx: NONREACTIVE
Hematocrit: 28.8 % — ABNORMAL LOW (ref 34.0–46.6)
Hemoglobin: 8 g/dL — ABNORMAL LOW (ref 11.1–15.9)
Hepatitis B Surface Ag: NEGATIVE
Immature Grans (Abs): 0.1 10*3/uL (ref 0.0–0.1)
Immature Granulocytes: 1 %
Lymphocytes Absolute: 1.1 10*3/uL (ref 0.7–3.1)
Lymphs: 10 %
MCH: 17.2 pg — ABNORMAL LOW (ref 26.6–33.0)
MCHC: 27.8 g/dL — ABNORMAL LOW (ref 31.5–35.7)
MCV: 62 fL — ABNORMAL LOW (ref 79–97)
Monocytes Absolute: 0.6 10*3/uL (ref 0.1–0.9)
Monocytes: 6 %
Neutrophils Absolute: 8.6 10*3/uL — ABNORMAL HIGH (ref 1.4–7.0)
Neutrophils: 82 %
Platelets: 248 10*3/uL (ref 150–450)
RBC: 4.66 x10E6/uL (ref 3.77–5.28)
RDW: 20.2 % — ABNORMAL HIGH (ref 11.7–15.4)
RPR Ser Ql: NONREACTIVE
Rh Factor: POSITIVE
Rubella Antibodies, IGG: 3.3 index (ref >0.99)
WBC: 10.6 10*3/uL (ref 3.4–10.8)

## 2018-08-07 LAB — MATERNIT 21 PLUS CORE, BLOOD
Fetal Fraction: 12
Result (T21): NEGATIVE
Trisomy 13 (Patau syndrome): NEGATIVE
Trisomy 18 (Edwards syndrome): NEGATIVE
Trisomy 21 (Down syndrome): NEGATIVE

## 2018-08-09 ENCOUNTER — Telehealth: Payer: Self-pay | Admitting: Obstetrics & Gynecology

## 2018-08-09 NOTE — Telephone Encounter (Signed)
Patient called for [name], requesting gender results.  [phone]

## 2018-08-09 NOTE — Telephone Encounter (Signed)
Pt advised gender test says girls. Pt voiced understanding. Twilight

## 2018-08-20 LAB — INTEGRATED 1
Crown Rump Length Twin B: 81 mm
Crown Rump Length: 79.7 mm
Gest. Age on Collection Date: 13.7 weeks
Maternal Age at EDD: 35.3 yr
NT Twin B: 1.9 mm
Nuchal Translucency (NT): 1.9 mm
Number of Fetuses: 2
PAPP-A Value: 4233 ng/mL
Weight: 148 [lb_av]

## 2018-08-30 ENCOUNTER — Other Ambulatory Visit: Payer: Self-pay | Admitting: Women's Health

## 2018-08-30 DIAGNOSIS — O30042 Twin pregnancy, dichorionic/diamniotic, second trimester: Secondary | ICD-10-CM

## 2018-08-30 DIAGNOSIS — Z363 Encounter for antenatal screening for malformations: Secondary | ICD-10-CM

## 2018-08-30 DIAGNOSIS — O0992 Supervision of high risk pregnancy, unspecified, second trimester: Secondary | ICD-10-CM

## 2018-08-31 ENCOUNTER — Ambulatory Visit (INDEPENDENT_AMBULATORY_CARE_PROVIDER_SITE_OTHER): Payer: Medicaid Other

## 2018-08-31 ENCOUNTER — Encounter: Payer: Self-pay | Admitting: Obstetrics & Gynecology

## 2018-08-31 ENCOUNTER — Other Ambulatory Visit: Payer: Self-pay

## 2018-08-31 ENCOUNTER — Ambulatory Visit (INDEPENDENT_AMBULATORY_CARE_PROVIDER_SITE_OTHER): Payer: Medicaid Other | Admitting: Obstetrics & Gynecology

## 2018-08-31 VITALS — BP 99/63 | HR 77 | Wt 157.0 lb

## 2018-08-31 DIAGNOSIS — Z3A18 18 weeks gestation of pregnancy: Secondary | ICD-10-CM

## 2018-08-31 DIAGNOSIS — O30042 Twin pregnancy, dichorionic/diamniotic, second trimester: Secondary | ICD-10-CM | POA: Diagnosis not present

## 2018-08-31 DIAGNOSIS — O0990 Supervision of high risk pregnancy, unspecified, unspecified trimester: Secondary | ICD-10-CM

## 2018-08-31 DIAGNOSIS — Z1389 Encounter for screening for other disorder: Secondary | ICD-10-CM

## 2018-08-31 DIAGNOSIS — O99012 Anemia complicating pregnancy, second trimester: Secondary | ICD-10-CM | POA: Diagnosis not present

## 2018-08-31 DIAGNOSIS — Z1379 Encounter for other screening for genetic and chromosomal anomalies: Secondary | ICD-10-CM | POA: Diagnosis not present

## 2018-08-31 DIAGNOSIS — O0992 Supervision of high risk pregnancy, unspecified, second trimester: Secondary | ICD-10-CM

## 2018-08-31 DIAGNOSIS — Z363 Encounter for antenatal screening for malformations: Secondary | ICD-10-CM | POA: Diagnosis not present

## 2018-08-31 DIAGNOSIS — F5089 Other specified eating disorder: Secondary | ICD-10-CM

## 2018-08-31 DIAGNOSIS — Z331 Pregnant state, incidental: Secondary | ICD-10-CM

## 2018-08-31 LAB — POCT URINALYSIS DIPSTICK OB
Blood, UA: NEGATIVE
Glucose, UA: NEGATIVE
Ketones, UA: NEGATIVE
Leukocytes, UA: NEGATIVE
Nitrite, UA: NEGATIVE
POC,PROTEIN,UA: NEGATIVE

## 2018-08-31 LAB — POCT HEMOGLOBIN: Hemoglobin: 8.6 g/dL — AB (ref 11–14.6)

## 2018-08-31 NOTE — Progress Notes (Signed)
[place] 18+1 DI/DI twins,cx length 3.2 cm,normal ovaries bilat,anatomy complete,no obvious abnormalities  BABY A:cephalic right,anterior placenta gr 0,svp of fluid 4.6 cm,fhr 140 bpm,EFW 265 g 88%,discordance 1.9% [name]:transverse head left,anterior placenta gr 0,svp of fluid 4.1,fhr 138 bpm,EFW 271 g 91%

## 2018-08-31 NOTE — Progress Notes (Signed)
   HIGH-RISK PREGNANCY VISIT Patient name: [name] MRN [id number]  Date of birth: 05-29-1985 Chief Complaint:   High Risk Gestation ([place] today; 2nd IT)  History of Present Illness:   [name] is a 33 y.o. [id number] female at [redacted]w[redacted]d with an Estimated Date of Delivery: 01/31/19 being seen today for ongoing management of a high-risk pregnancy complicated by DiDi twins.  Today she reports no complaints. Contractions: Not present. Vag. Bleeding: None.  Movement: Present. denies leaking of fluid.  Review of Systems:   Pertinent items are noted in HPI Denies abnormal vaginal discharge w/ itching/odor/irritation, headaches, visual changes, shortness of breath, chest pain, abdominal pain, severe nausea/vomiting, or problems with urination or bowel movements unless otherwise stated above. Pertinent History Reviewed:  Reviewed past medical,surgical, social, obstetrical and family history.  Reviewed problem list, medications and allergies. Physical Assessment:   Vitals:   08/31/18 1027  BP: 99/63  Pulse: 77  Weight: 157 lb (71.2 kg)  Body mass index is 26.95 kg/m.           Physical Examination:   General appearance: alert, well appearing, and in no distress  Mental status: alert, oriented to person, place, and time  Skin: warm & dry   Extremities: Edema: None    Cardiovascular: normal heart rate noted  Respiratory: normal respiratory effort, no distress  Abdomen: gravid, soft, non-tender  Pelvic: Cervical exam deferred         Fetal Status:     Movement: Present    Fetal Surveillance Testing today: sonogram DiDi twins normal   Results for orders placed or performed in visit on 08/31/18 (from the past 24 hour(s))  POC Urinalysis Dipstick OB   Collection Time: 08/31/18 10:28 AM  Result Value Ref Range   Color, UA     Clarity, UA     Glucose, UA Negative Negative   Bilirubin, UA     Ketones, UA neg    Spec Grav, UA     Blood, UA neg    pH, UA     POC,PROTEIN,UA Negative  Negative, Trace, Small (1+), Moderate (2+), Large (3+), 4+   Urobilinogen, UA     Nitrite, UA neg    Leukocytes, UA Negative Negative   Appearance     Odor    POCT hemoglobin   Collection Time: 08/31/18 10:34 AM  Result Value Ref Range   Hemoglobin 8.6 (A) 11 - 14.6 g/dL    Assessment & Plan:  1) High-risk pregnancy [id number] at [redacted]w[redacted]d with an Estimated Date of Delivery: 01/31/19   2) DiDi twins, stable  3) PICA, stable, still eating self rising flour multiple times daily  Meds: No orders of the defined types were placed in this encounter.   Labs/procedures today:   Treatment Plan:  Repeat scan 28 weeks for EFW, routine appt 4 weeks  Reviewed: Preterm labor symptoms and general obstetric precautions including but not limited to vaginal bleeding, contractions, leaking of fluid and fetal movement were reviewed in detail with the patient.  All questions were answered.  home bp cuff. Rx faxed to . Check bp weekly, let us know if >140/90.   Follow-up: Return in about 4 weeks (around 09/28/2018) for HROB.  Orders Placed This Encounter  Procedures  . INTEGRATED 2  . POC Urinalysis Dipstick OB  . POCT hemoglobin   Florian Buff  08/31/2018 10:44 AM

## 2018-09-03 LAB — INTEGRATED 2
AFP MoM: 2.78
Alpha-Fetoprotein: 128.8 ng/mL
Crown Rump Length Twin B: 81 mm
Crown Rump Length: 79.7 mm
DIA MoM: 2.43
DIA Value: 404.3 pg/mL
Estriol, Unconjugated: 2.89 ng/mL
Gest. Age on Collection Date: 13.7 weeks
Gestational Age: 18.6 weeks
Maternal Age at EDD: 35.3 yr
NT MoM Twin B: 0.99
NT Twin B: 1.9 mm
Nuchal Translucency (NT): 1.9 mm
Nuchal Translucency MoM: 1.01
Number of Fetuses: 2
PAPP-A MoM: 2.7
PAPP-A Value: 4233 ng/mL
Weight: 148 [lb_av]
Weight: 157 [lb_av]
hCG MoM: 2.27
hCG Value: 53.9 IU/mL
uE3 MoM: 1.89

## 2018-09-22 ENCOUNTER — Encounter: Payer: Self-pay | Admitting: *Deleted

## 2018-09-22 NOTE — Telephone Encounter (Signed)
Pt calling back and states that the notes were not received and she is needing them resent.

## 2018-09-28 ENCOUNTER — Other Ambulatory Visit: Payer: Self-pay

## 2018-09-28 ENCOUNTER — Ambulatory Visit (INDEPENDENT_AMBULATORY_CARE_PROVIDER_SITE_OTHER): Payer: Medicaid Other | Admitting: Obstetrics & Gynecology

## 2018-09-28 ENCOUNTER — Encounter: Payer: Self-pay | Admitting: Obstetrics & Gynecology

## 2018-09-28 VITALS — BP 107/60 | HR 82 | Wt 165.3 lb

## 2018-09-28 DIAGNOSIS — Z1389 Encounter for screening for other disorder: Secondary | ICD-10-CM

## 2018-09-28 DIAGNOSIS — O30042 Twin pregnancy, dichorionic/diamniotic, second trimester: Secondary | ICD-10-CM

## 2018-09-28 DIAGNOSIS — Z3A22 22 weeks gestation of pregnancy: Secondary | ICD-10-CM

## 2018-09-28 DIAGNOSIS — Z331 Pregnant state, incidental: Secondary | ICD-10-CM

## 2018-09-28 DIAGNOSIS — O0990 Supervision of high risk pregnancy, unspecified, unspecified trimester: Secondary | ICD-10-CM

## 2018-09-28 DIAGNOSIS — O0992 Supervision of high risk pregnancy, unspecified, second trimester: Secondary | ICD-10-CM

## 2018-09-28 LAB — POCT URINALYSIS DIPSTICK OB
Blood, UA: NEGATIVE
Glucose, UA: NEGATIVE
Leukocytes, UA: NEGATIVE
Nitrite, UA: NEGATIVE

## 2018-09-28 NOTE — Addendum Note (Signed)
Addended by: Florian Buff on: 09/28/2018 10:29 AM   Modules accepted: Orders

## 2018-09-28 NOTE — Progress Notes (Signed)
   HIGH-RISK PREGNANCY VISIT Patient name: [name] MRN [id number]  Date of birth: 07-Oct-1985 Chief Complaint:   High Risk Gestation  History of Present Illness:   [name] is a 33 y.o. [id number] female at [redacted]w[redacted]d with an Estimated Date of Delivery: 01/31/19 being seen today for ongoing management of a high-risk pregnancy complicated by DiDi twins Today she reports no complaints. Contractions: Not present.  .  Movement: Present. denies leaking of fluid.  Review of Systems:   Pertinent items are noted in HPI Denies abnormal vaginal discharge w/ itching/odor/irritation, headaches, visual changes, shortness of breath, chest pain, abdominal pain, severe nausea/vomiting, or problems with urination or bowel movements unless otherwise stated above. Pertinent History Reviewed:  Reviewed past medical,surgical, social, obstetrical and family history.  Reviewed problem list, medications and allergies. Physical Assessment:   Vitals:   09/28/18 0956  BP: 107/60  Pulse: 82  Weight: 165 lb 4.8 oz (75 kg)  Body mass index is 28.37 kg/m.           Physical Examination:   General appearance: alert, well appearing, and in no distress  Mental status: alert, oriented to person, place, and time  Skin: warm & dry   Extremities: Edema: None    Cardiovascular: normal heart rate noted  Respiratory: normal respiratory effort, no distress  Abdomen: gravid, soft, non-tender  Pelvic: Cervical exam deferred         Fetal Status: Fetal Heart Rate (bpm): 140/150 Fundal Height: 28 cm Movement: Present    Fetal Surveillance Testing today: see above   Results for orders placed or performed in visit on 09/28/18 (from the past 24 hour(s))  POC Urinalysis Dipstick OB   Collection Time: 09/28/18  9:55 AM  Result Value Ref Range   Color, UA     Clarity, UA     Glucose, UA Negative Negative   Bilirubin, UA     Ketones, UA trace    Spec Grav, UA     Blood, UA neg    pH, UA     POC,PROTEIN,UA Trace  Negative, Trace, Small (1+), Moderate (2+), Large (3+), 4+   Urobilinogen, UA     Nitrite, UA neg    Leukocytes, UA Negative Negative   Appearance     Odor      Assessment & Plan:  1) High-risk pregnancy [id number] at [redacted]w[redacted]d with an Estimated Date of Delivery: 01/31/19   2) DiDi twins, stable, growth scan next visit + PN2    Meds: No orders of the defined types were placed in this encounter.   Labs/procedures today:   Treatment Plan:  Growth scan 4 weeks, plus PN2  Reviewed: Preterm labor symptoms and general obstetric precautions including but not limited to vaginal bleeding, contractions, leaking of fluid and fetal movement were reviewed in detail with the patient.  All questions were answered. Has home bp cuff. Check bp weekly, let us know if >140/90.   Follow-up: Return in about 4 weeks (around 10/26/2018) for grwoth ultrasound, PN2, HROB, with Dr Elonda Husky.  Orders Placed This Encounter  Procedures  . POC Urinalysis Dipstick OB   [name]  09/28/2018 10:25 AM

## 2018-10-26 ENCOUNTER — Other Ambulatory Visit: Payer: Medicaid Other

## 2018-10-26 ENCOUNTER — Ambulatory Visit (INDEPENDENT_AMBULATORY_CARE_PROVIDER_SITE_OTHER): Payer: Medicaid Other

## 2018-10-26 ENCOUNTER — Other Ambulatory Visit: Payer: Self-pay

## 2018-10-26 ENCOUNTER — Ambulatory Visit (INDEPENDENT_AMBULATORY_CARE_PROVIDER_SITE_OTHER): Payer: Medicaid Other | Admitting: Obstetrics & Gynecology

## 2018-10-26 ENCOUNTER — Encounter: Payer: Self-pay | Admitting: Obstetrics & Gynecology

## 2018-10-26 VITALS — BP 113/69 | HR 98 | Wt 172.5 lb

## 2018-10-26 DIAGNOSIS — O0990 Supervision of high risk pregnancy, unspecified, unspecified trimester: Secondary | ICD-10-CM

## 2018-10-26 DIAGNOSIS — O0992 Supervision of high risk pregnancy, unspecified, second trimester: Secondary | ICD-10-CM

## 2018-10-26 DIAGNOSIS — Z3A26 26 weeks gestation of pregnancy: Secondary | ICD-10-CM

## 2018-10-26 DIAGNOSIS — O30042 Twin pregnancy, dichorionic/diamniotic, second trimester: Secondary | ICD-10-CM

## 2018-10-26 NOTE — Progress Notes (Signed)
   HIGH-RISK PREGNANCY VISIT Patient name: [name] MRN [id number]  Date of birth: April 02, 1985 Chief Complaint:   High Risk Gestation (PN2; [place] today; both legs hurt)  History of Present Illness:   [name] is a 33 y.o. [id number] female at [redacted]w[redacted]d with an Estimated Date of Delivery: 01/31/19 being seen today for ongoing management of a high-risk pregnancy complicated by [name] twins Today she reports back pain leg pain. Contractions: Not present. Vag. Bleeding: None.  Movement: Present. denies leaking of fluid.  Review of Systems:   Pertinent items are noted in HPI Denies abnormal vaginal discharge w/ itching/odor/irritation, headaches, visual changes, shortness of breath, chest pain, abdominal pain, severe nausea/vomiting, or problems with urination or bowel movements unless otherwise stated above. Pertinent History Reviewed:  Reviewed past medical,surgical, social, obstetrical and family history.  Reviewed problem list, medications and allergies. Physical Assessment:   Vitals:   10/26/18 1021  BP: 113/69  Pulse: 98  Weight: 172 lb 8 oz (78.2 kg)  Body mass index is 29.61 kg/m.           Physical Examination:   General appearance: alert, well appearing, and in no distress  Mental status: alert, oriented to person, place, and time  Skin: warm & dry   Extremities: Edema: None    Cardiovascular: normal heart rate noted  Respiratory: normal respiratory effort, no distress  Abdomen: gravid, soft, non-tender  Pelvic: Cervical exam deferred         Fetal Status:     Movement: Present    Fetal Surveillance Testing today: PN2, sonogrm   No results found for this or any previous visit (from the past 24 hour(s)).  Assessment & Plan:  1) High-risk pregnancy [id number] at [redacted]w[redacted]d with an Estimated Date of Delivery: 01/31/19   2) DiDi twins, stable, great growth equivalent    Meds: No orders of the defined types were placed in this encounter.   Labs/procedures today: sonogram + PN2   Treatment Plan:  HROB 3 weeks  Reviewed: Preterm labor symptoms and general obstetric precautions including but not limited to vaginal bleeding, contractions, leaking of fluid and fetal movement were reviewed in detail with the patient.  All questions were answered.  Check bp weekly, let us know if >140/90.   Follow-up: Return in about 3 weeks (around 11/16/2018) for [place], with Dr Elonda Husky.  No orders of the defined types were placed in this encounter.  Mertie Clause  10/26/2018 11:05 AM

## 2018-10-26 NOTE — Progress Notes (Signed)
[place] 26+1 wks,DI/DI twins,normal ovaries bilat,cx 2.8 cm BABY A: breech,anterior placenta gr 1,svp of fluid 3.6 cm,fhr 148 bpm,efw 976 g 63%,discordance 2% [name]: transverse head right,anterior placenta gr 1,svp of fluid 4.4 cm,fhr 150 bpm,efw 997 g 69%

## 2018-10-27 LAB — RPR: RPR Ser Ql: NONREACTIVE

## 2018-10-27 LAB — GLUCOSE TOLERANCE, 2 HOURS W/ 1HR
Glucose, 1 hour: 163 mg/dL (ref 65–179)
Glucose, 2 hour: 153 mg/dL — ABNORMAL HIGH (ref 65–152)
Glucose, Fasting: 82 mg/dL (ref 65–91)

## 2018-10-27 LAB — CBC
Hematocrit: 31.7 % — ABNORMAL LOW (ref 34.0–46.6)
Hemoglobin: 9.3 g/dL — ABNORMAL LOW (ref 11.1–15.9)
MCH: 22.2 pg — ABNORMAL LOW (ref 26.6–33.0)
MCHC: 29.3 g/dL — ABNORMAL LOW (ref 31.5–35.7)
MCV: 76 fL — ABNORMAL LOW (ref 79–97)
Platelets: 207 10*3/uL (ref 150–450)
RBC: 4.19 x10E6/uL (ref 3.77–5.28)
RDW: 24.7 % — ABNORMAL HIGH (ref 11.7–15.4)
WBC: 11.4 10*3/uL — ABNORMAL HIGH (ref 3.4–10.8)

## 2018-10-27 LAB — ANTIBODY SCREEN: Antibody Screen: NEGATIVE

## 2018-10-27 LAB — HIV ANTIBODY (ROUTINE TESTING W REFLEX): HIV Screen 4th Generation wRfx: NONREACTIVE

## 2018-11-02 ENCOUNTER — Encounter: Payer: Self-pay | Admitting: *Deleted

## 2018-11-02 ENCOUNTER — Encounter: Payer: Self-pay | Admitting: Women's Health

## 2018-11-02 ENCOUNTER — Other Ambulatory Visit: Payer: Self-pay | Admitting: *Deleted

## 2018-11-02 DIAGNOSIS — O24410 Gestational diabetes mellitus in pregnancy, diet controlled: Secondary | ICD-10-CM

## 2018-11-02 DIAGNOSIS — O24419 Gestational diabetes mellitus in pregnancy, unspecified control: Secondary | ICD-10-CM | POA: Insufficient documentation

## 2018-11-02 DIAGNOSIS — O0990 Supervision of high risk pregnancy, unspecified, unspecified trimester: Secondary | ICD-10-CM

## 2018-11-02 DIAGNOSIS — Z8632 Personal history of gestational diabetes: Secondary | ICD-10-CM | POA: Insufficient documentation

## 2018-11-02 MED ORDER — ACCU-CHEK FASTCLIX LANCETS MISC: 1.0000 | Freq: Four times a day (QID) | 12 refills | Status: DC

## 2018-11-02 MED ORDER — ACCU-CHEK GUIDE ME W/DEVICE KIT: 1.0000 | PACK | Freq: Four times a day (QID) | 0 refills | Status: DC

## 2018-11-02 MED ORDER — ACCU-CHEK GUIDE VI STRP: ORAL_STRIP | 12 refills | Status: DC

## 2018-11-16 ENCOUNTER — Encounter: Payer: Self-pay | Admitting: Obstetrics & Gynecology

## 2018-11-16 ENCOUNTER — Ambulatory Visit (INDEPENDENT_AMBULATORY_CARE_PROVIDER_SITE_OTHER): Payer: Medicaid Other | Admitting: Obstetrics & Gynecology

## 2018-11-16 ENCOUNTER — Other Ambulatory Visit: Payer: Self-pay

## 2018-11-16 VITALS — BP 111/68 | HR 99 | Wt 177.0 lb

## 2018-11-16 DIAGNOSIS — O0990 Supervision of high risk pregnancy, unspecified, unspecified trimester: Secondary | ICD-10-CM

## 2018-11-16 DIAGNOSIS — Z3A29 29 weeks gestation of pregnancy: Secondary | ICD-10-CM

## 2018-11-16 DIAGNOSIS — O0993 Supervision of high risk pregnancy, unspecified, third trimester: Secondary | ICD-10-CM

## 2018-11-16 DIAGNOSIS — O24410 Gestational diabetes mellitus in pregnancy, diet controlled: Secondary | ICD-10-CM

## 2018-11-16 DIAGNOSIS — O30042 Twin pregnancy, dichorionic/diamniotic, second trimester: Secondary | ICD-10-CM

## 2018-11-16 DIAGNOSIS — O30043 Twin pregnancy, dichorionic/diamniotic, third trimester: Secondary | ICD-10-CM

## 2018-11-16 NOTE — Progress Notes (Signed)
   HIGH-RISK PREGNANCY VISIT Patient name: [name] MRN [id number]  Date of birth: [birth year] Chief Complaint:   High Risk Gestation (think has yeast infection)  History of Present Illness:   [name] is a 33 y.o. [id number] female at [redacted]w[redacted]d with an Estimated Date of Delivery: 01/31/19 being seen today for ongoing management of a high-risk pregnancy complicated by [id number] Today she reports no complaints. Contractions: Not present.  .  Movement: Present. denies leaking of fluid.  Review of Systems:   Pertinent items are noted in HPI Denies abnormal vaginal discharge w/ itching/odor/irritation, headaches, visual changes, shortness of breath, chest pain, abdominal pain, severe nausea/vomiting, or problems with urination or bowel movements unless otherwise stated above. Pertinent History Reviewed:  Reviewed past medical,surgical, social, obstetrical and family history.  Reviewed problem list, medications and allergies. Physical Assessment:   Vitals:   11/16/18 0900  BP: 111/68  Pulse: 99  Weight: 177 lb (80.3 kg)  Body mass index is 30.38 kg/m.           Physical Examination:   General appearance: alert, well appearing, and in no distress  Mental status: alert, oriented to person, place, and time  Skin: warm & dry   Extremities: Edema: Trace    Cardiovascular: normal heart rate noted  Respiratory: normal respiratory effort, no distress  Abdomen: gravid, soft, non-tender  Pelvic: Cervical exam deferred         Fetal Status:     Movement: Present    Fetal Surveillance Testing today:    No results found for this or any previous visit (from the past 24 hour(s)).  Assessment & Plan:  1) High-risk pregnancy [id number] at [redacted]w[redacted]d with an Estimated Date of Delivery: 01/31/19   2) DiDi twins, stable  3) Class A1 DM, stable, some are elevated but due to dietary indiscretions  Meds: No orders of the defined types were placed in this encounter.   Labs/procedures today:   Treatment  Plan:  Sonogram 32 weeks, CBG patterning  Reviewed: Preterm labor symptoms and general obstetric precautions including but not limited to vaginal bleeding, contractions, leaking of fluid and fetal movement were reviewed in detail with the patient.  All questions were answered.   Follow-up: Return in about 3 weeks (around 12/07/2018) for sonogram for twins growth, HROB, with Dr Elonda Husky.  Orders Placed This Encounter  Procedures  . US OB Follow Up  . US OB Follow Up   Florian Buff 11/16/2018 9:52 AM

## 2018-11-17 ENCOUNTER — Ambulatory Visit: Payer: Medicaid Other | Admitting: *Deleted

## 2018-11-17 ENCOUNTER — Encounter: Payer: Medicaid Other | Attending: Family Medicine | Admitting: *Deleted

## 2018-11-17 DIAGNOSIS — Z713 Dietary counseling and surveillance: Secondary | ICD-10-CM | POA: Diagnosis not present

## 2018-11-17 DIAGNOSIS — Z3A00 Weeks of gestation of pregnancy not specified: Secondary | ICD-10-CM | POA: Diagnosis not present

## 2018-11-17 DIAGNOSIS — O24410 Gestational diabetes mellitus in pregnancy, diet controlled: Secondary | ICD-10-CM

## 2018-11-17 DIAGNOSIS — O24419 Gestational diabetes mellitus in pregnancy, unspecified control: Secondary | ICD-10-CM | POA: Diagnosis not present

## 2018-11-18 NOTE — Progress Notes (Signed)
  Patient was seen on 11/17/2018 for Gestational Diabetes self-management. EDD 01/31/2019. She is pregnant with twin girls.  Patient states no history of GDM. Diet history obtained. Patient eats fair variety of all food groups with at least 2 meals a day from fast food and very few vegetables or fruits. . Beverages include propel water, occasional gatorade and sweet tea.  The following learning objectives were met by the patient :   States the definition of Gestational Diabetes  States why dietary management is important in controlling blood glucose  Describes the effects of carbohydrates on blood glucose levels  Demonstrates ability to create a balanced meal plan  Demonstrates carbohydrate counting   States when to check blood glucose levels  Demonstrates proper blood glucose monitoring techniques  States the effect of stress and exercise on blood glucose levels  States the importance of limiting caffeine and abstaining from alcohol and smoking  Plan:  With twins, aim for 4 Carb Choices per meal (60 grams) +/- 1 either way  Aim for 1-2 Carbs per snack Work on adding vegetables more often and consider cooking at home more often for better nutrition and also the cost savings. Include fewer sweetened beverages and try to increase water intake  Begin reading food labels for Total Carbohydrate of foods If OK with your MD, consider  increasing your activity level by walking, Arm Chair Exercises or other activity daily as tolerated Begin checking BG before breakfast and 2 hours after first bite of breakfast, lunch and dinner as directed by MD  Bring Log Book/Sheet and meter to every medical appointment OR use Baby Scripts (see below) Baby Scripts:  Patient was introduced to [employer] and plans to use as record of BG electronically Take medication if directed by MD  Patient already has a meter: Accu Chek Guide And is testing pre breakfast and occasionally 2 hours some meals but she did  not bring her meter or readings to this appointment. She states her fasting numbers are in the 80's and that she has noticed her blood sugars after a meal are higher when she drinks sweet tea.   Patient instructed to test pre breakfast and 2 hours each meal as directed by MD  Patient instructed to monitor glucose levels: FBS: 60 - 95 mg/dl 2 hour: <120 mg/dl  Patient received the following handouts:  Nutrition Diabetes and Pregnancy  Carbohydrate Counting List  Patient will be seen for follow-up as needed.

## 2018-11-24 ENCOUNTER — Other Ambulatory Visit: Payer: Self-pay

## 2018-11-24 DIAGNOSIS — R6889 Other general symptoms and signs: Secondary | ICD-10-CM | POA: Diagnosis not present

## 2018-11-24 DIAGNOSIS — Z20822 Contact with and (suspected) exposure to covid-19: Secondary | ICD-10-CM

## 2018-11-26 LAB — NOVEL CORONAVIRUS, NAA: SARS-CoV-2, NAA: NOT DETECTED

## 2018-12-09 ENCOUNTER — Encounter: Payer: Self-pay | Admitting: Obstetrics & Gynecology

## 2018-12-09 ENCOUNTER — Ambulatory Visit (INDEPENDENT_AMBULATORY_CARE_PROVIDER_SITE_OTHER): Payer: Medicaid Other | Admitting: Obstetrics & Gynecology

## 2018-12-09 ENCOUNTER — Ambulatory Visit (INDEPENDENT_AMBULATORY_CARE_PROVIDER_SITE_OTHER): Payer: Medicaid Other

## 2018-12-09 ENCOUNTER — Other Ambulatory Visit: Payer: Self-pay

## 2018-12-09 VITALS — BP 124/72 | HR 93 | Wt 184.0 lb

## 2018-12-09 DIAGNOSIS — O24410 Gestational diabetes mellitus in pregnancy, diet controlled: Secondary | ICD-10-CM

## 2018-12-09 DIAGNOSIS — Z3A32 32 weeks gestation of pregnancy: Secondary | ICD-10-CM

## 2018-12-09 DIAGNOSIS — Z331 Pregnant state, incidental: Secondary | ICD-10-CM

## 2018-12-09 DIAGNOSIS — O30042 Twin pregnancy, dichorionic/diamniotic, second trimester: Secondary | ICD-10-CM

## 2018-12-09 DIAGNOSIS — Z1389 Encounter for screening for other disorder: Secondary | ICD-10-CM

## 2018-12-09 DIAGNOSIS — O30043 Twin pregnancy, dichorionic/diamniotic, third trimester: Secondary | ICD-10-CM

## 2018-12-09 DIAGNOSIS — O0990 Supervision of high risk pregnancy, unspecified, unspecified trimester: Secondary | ICD-10-CM

## 2018-12-09 DIAGNOSIS — O0993 Supervision of high risk pregnancy, unspecified, third trimester: Secondary | ICD-10-CM

## 2018-12-09 LAB — POCT URINALYSIS DIPSTICK OB
Blood, UA: NEGATIVE
Glucose, UA: NEGATIVE
Ketones, UA: NEGATIVE
Leukocytes, UA: NEGATIVE
Nitrite, UA: NEGATIVE
POC,PROTEIN,UA: NEGATIVE

## 2018-12-09 NOTE — Progress Notes (Signed)
[place] DI/DI TWINS 32+3 wks BABY A:cephalic right,anterior placenta gr 2,svp of fluid 3.3 cm,fhr 150 bpm,EFW 2107 g 59%,discordance 3.9% [name]:transverse head right,anterior placenta gr 2,svp of fluid 3.2 cm,fhr 142 bpm,EFW 2192 g 71%

## 2018-12-09 NOTE — Progress Notes (Signed)
   HIGH-RISK PREGNANCY VISIT Patient name: [name] MRN [id number]  Date of birth: 22-Mar-1985 Chief Complaint:   Routine Prenatal Visit  History of Present Illness:   [name] is a 33 y.o. [id number] female at [redacted]w[redacted]d with an Estimated Date of Delivery: 01/31/19 being seen today for ongoing management of a high-risk pregnancy complicated by DiDi twins.  Today she reports no complaints. Contractions: Not present. Vag. Bleeding: None.  Movement: Present. denies leaking of fluid.  Review of Systems:   Pertinent items are noted in HPI Denies abnormal vaginal discharge w/ itching/odor/irritation, headaches, visual changes, shortness of breath, chest pain, abdominal pain, severe nausea/vomiting, or problems with urination or bowel movements unless otherwise stated above. Pertinent History Reviewed:  Reviewed past medical,surgical, social, obstetrical and family history.  Reviewed problem list, medications and allergies. Physical Assessment:   Vitals:   12/09/18 1534  BP: 124/72  Pulse: 93  Weight: 184 lb (83.5 kg)  Body mass index is 31.58 kg/m.           Physical Examination:   General appearance: alert, well appearing, and in no distress  Mental status: alert, oriented to person, place, and time  Skin: warm & dry   Extremities: Edema: Trace    Cardiovascular: normal heart rate noted  Respiratory: normal respiratory effort, no distress  Abdomen: gravid, soft, non-tender  Pelvic: Cervical exam deferred         Fetal Status: Fetal Heart Rate (bpm): [place] Fundal Height: 37 cm Movement: Present    Fetal Surveillance Testing today: sonogram see report   Results for orders placed or performed in visit on 12/09/18 (from the past 24 hour(s))  POC Urinalysis Dipstick OB   Collection Time: 12/09/18  3:39 PM  Result Value Ref Range   Color, UA     Clarity, UA     Glucose, UA Negative Negative   Bilirubin, UA     Ketones, UA neg    Spec Grav, UA     Blood, UA neg    pH, UA     POC,PROTEIN,UA Negative Negative, Trace, Small (1+), Moderate (2+), Large (3+), 4+   Urobilinogen, UA     Nitrite, UA neg    Leukocytes, UA Negative Negative   Appearance     Odor      Assessment & Plan:  1) High-risk pregnancy [id number] at [redacted]w[redacted]d with an Estimated Date of Delivery: 01/31/19   2) DiDi twins, stable, good interval growth equally grown twin A vertex  3) Class A1 DM, stable, overall pretty good  Meds: No orders of the defined types were placed in this encounter.   Labs/procedures today: sonogram  Treatment Plan:  EFW 4 weeks, HROB 2 weeks  Reviewed: Preterm labor symptoms and general obstetric precautions including but not limited to vaginal bleeding, contractions, leaking of fluid and fetal movement were reviewed in detail with the patient.  All questions were answered. Has home bp cuff. Rx faxed to  Check bp weekly, let us know if >140/90.   Follow-up: Return in about 2 weeks (around 12/23/2018) for [place].  Orders Placed This Encounter  Procedures  . POC Urinalysis Dipstick OB   Florian Buff  12/09/2018 3:55 PM

## 2018-12-22 ENCOUNTER — Other Ambulatory Visit: Payer: Self-pay

## 2018-12-22 ENCOUNTER — Ambulatory Visit (INDEPENDENT_AMBULATORY_CARE_PROVIDER_SITE_OTHER): Payer: Medicaid Other | Admitting: Obstetrics and Gynecology

## 2018-12-22 VITALS — BP 127/71 | HR 102 | Wt 186.8 lb

## 2018-12-22 DIAGNOSIS — Z331 Pregnant state, incidental: Secondary | ICD-10-CM

## 2018-12-22 DIAGNOSIS — Z3493 Encounter for supervision of normal pregnancy, unspecified, third trimester: Secondary | ICD-10-CM

## 2018-12-22 DIAGNOSIS — Z1389 Encounter for screening for other disorder: Secondary | ICD-10-CM

## 2018-12-22 DIAGNOSIS — Z3A34 34 weeks gestation of pregnancy: Secondary | ICD-10-CM

## 2018-12-22 LAB — POCT URINALYSIS DIPSTICK OB
Blood, UA: NEGATIVE
Glucose, UA: NEGATIVE
Ketones, UA: NEGATIVE
Leukocytes, UA: NEGATIVE
Nitrite, UA: NEGATIVE

## 2018-12-22 MED ORDER — METFORMIN HCL 500 MG PO TABS: 500.0000 mg | ORAL_TABLET | Freq: Every day | ORAL | 1 refills | Status: DC

## 2018-12-22 NOTE — Progress Notes (Signed)
Patient ID: [name], female   DOB: 09-05-1985, 33 y.o.   MRN: [id number]    [facility] PREGNANCY VISIT Patient name: [name] MRN [id number]  Date of birth: 1985/11/21 Chief Complaint:   Routine Prenatal Visit  History of Present Illness:   [name] is a 33 y.o. [id number] female at [redacted]w[redacted]d with an Estimated Date of Delivery: 01/31/19 being seen today for ongoing management of a high-risk pregnancy complicated by DiDi twins.  She lost first set of twins at 14 weeks. She had an epidural for her first child and didn't have any with her other children. Today she reports no complaints. Contractions: Not present. Vag. Bleeding: None.  Movement: Present. denies leaking of fluid.   CBGs are reviewed.  Most fasting blood sugars are under 95, 2 or above 100 this week.  Postprandials are almost all greater than 120 up to the highest level of 160.  Will add 500 mg Metformin q. morning with breakfast Review of Systems:   Pertinent items are noted in HPI  Denies abnormal vaginal discharge w/ itching/odor/irritation, headaches, visual changes, shortness of breath, chest pain, abdominal pain, severe nausea/vomiting, or problems with urination or bowel movements unless otherwise stated above. Pertinent History Reviewed:  Reviewed past medical,surgical, social, obstetrical and family history.  Reviewed problem list, medications and allergies. Physical Assessment:   Vitals:   12/22/18 0857  BP: 127/71  Pulse: (!) 102  Weight: 186 lb 12.8 oz (84.7 kg)  Body mass index is 32.06 kg/m.           Physical Examination:   General appearance: alert, well appearing, and in no distress  Mental status: alert, oriented to person, place, and time, normal mood, behavior, speech, dress, motor activity, and thought processes  Skin: warm & dry   Extremities:      Cardiovascular: normal heart rate noted  Respiratory: normal respiratory effort, no distress  Abdomen: gravid, soft, non-tender, small edema baby A  feels vertex [name] feels transverse, ultrasound not done  Pelvic: Cervical exam deferred         Fetal Status: Fetal Heart Rate (bpm): 150/143 Fundal Height: 42 cm Movement: Present    Fetal Surveillance Testing today: None   Chaperone: Arnette Norris    Results for orders placed or performed in visit on 12/22/18 (from the past 24 hour(s))  POC Urinalysis Dipstick OB   Collection Time: 12/22/18  8:54 AM  Result Value Ref Range   Color, UA     Clarity, UA     Glucose, UA Negative Negative   Bilirubin, UA     Ketones, UA n    Spec Grav, UA     Blood, UA n    pH, UA     POC,PROTEIN,UA Trace Negative, Trace, Small (1+), Moderate (2+), Large (3+), 4+   Urobilinogen, UA     Nitrite, UA n    Leukocytes, UA Negative Negative   Appearance     Odor      Assessment & Plan:  1) High-risk pregnancy [id number] at [redacted]w[redacted]d with an Estimated Date of Delivery: 01/31/19   2) DiDi twins, stable cephalic baby A at 32 weeks  3) A1 DM,, suboptimal control will add Metformin 500 mg once every morning  Meds: No orders of the defined types were placed in this encounter.  Labs/procedures today: None  Treatment Plan: 1 week NST, 2 weeks for ultrasound EFW, gbs, gcchl, B. NST to begin next week .  IOL at 38 weeks or  earlier if labor progresses.  Reviewed: Preterm labor symptoms and general obstetric precautions including but not limited to vaginal bleeding, contractions, leaking of fluid and fetal movement were reviewed in detail with the patient.  All questions were answered.   Follow-up: Follow-up NST 1 week  Orders Placed This Encounter  Procedures  . POC Urinalysis Dipstick OB    By signing my name below, I, Samul Dada, attest that this documentation has been prepared under the direction and in the presence of Jonnie Kind, MD. Electronically Signed: [place]. 12/22/18. 9:18 AM.  I personally performed the services described in this documentation, which was SCRIBED in  my presence. The recorded information has been reviewed and considered accurate. It has been edited as necessary during review. Jonnie Kind, MD

## 2018-12-29 ENCOUNTER — Encounter: Payer: Self-pay | Admitting: Obstetrics and Gynecology

## 2018-12-29 ENCOUNTER — Other Ambulatory Visit: Payer: Self-pay

## 2018-12-29 ENCOUNTER — Ambulatory Visit (INDEPENDENT_AMBULATORY_CARE_PROVIDER_SITE_OTHER): Payer: Medicaid Other | Admitting: Obstetrics and Gynecology

## 2018-12-29 VITALS — BP 132/71 | HR 89 | Wt 187.6 lb

## 2018-12-29 DIAGNOSIS — O30043 Twin pregnancy, dichorionic/diamniotic, third trimester: Secondary | ICD-10-CM | POA: Diagnosis not present

## 2018-12-29 DIAGNOSIS — O2240 Hemorrhoids in pregnancy, unspecified trimester: Secondary | ICD-10-CM | POA: Insufficient documentation

## 2018-12-29 DIAGNOSIS — Z1389 Encounter for screening for other disorder: Secondary | ICD-10-CM

## 2018-12-29 DIAGNOSIS — O0990 Supervision of high risk pregnancy, unspecified, unspecified trimester: Secondary | ICD-10-CM

## 2018-12-29 DIAGNOSIS — O24410 Gestational diabetes mellitus in pregnancy, diet controlled: Secondary | ICD-10-CM | POA: Diagnosis not present

## 2018-12-29 DIAGNOSIS — O0993 Supervision of high risk pregnancy, unspecified, third trimester: Secondary | ICD-10-CM

## 2018-12-29 DIAGNOSIS — Z23 Encounter for immunization: Secondary | ICD-10-CM | POA: Diagnosis not present

## 2018-12-29 DIAGNOSIS — O99013 Anemia complicating pregnancy, third trimester: Secondary | ICD-10-CM

## 2018-12-29 DIAGNOSIS — Z3A35 35 weeks gestation of pregnancy: Secondary | ICD-10-CM | POA: Diagnosis not present

## 2018-12-29 DIAGNOSIS — Z331 Pregnant state, incidental: Secondary | ICD-10-CM

## 2018-12-29 DIAGNOSIS — O2243 Hemorrhoids in pregnancy, third trimester: Secondary | ICD-10-CM

## 2018-12-29 LAB — POCT URINALYSIS DIPSTICK OB
Blood, UA: NEGATIVE
Glucose, UA: NEGATIVE
Ketones, UA: NEGATIVE
Leukocytes, UA: NEGATIVE
Nitrite, UA: NEGATIVE

## 2018-12-29 MED ORDER — HYDROCORTISONE 2.5 % EX CREA: TOPICAL_CREAM | Freq: Two times a day (BID) | CUTANEOUS | 1 refills | Status: DC

## 2018-12-29 MED ORDER — SENNOSIDES-DOCUSATE SODIUM 8.6-50 MG PO TABS: 1.0000 | ORAL_TABLET | Freq: Every day | ORAL | 2 refills | Status: DC

## 2018-12-29 MED ORDER — HYDROCORTISONE ACETATE 25 MG RE SUPP: 25.0000 mg | Freq: Two times a day (BID) | RECTAL | 1 refills | Status: DC

## 2018-12-29 NOTE — Progress Notes (Signed)
Subjective:  [name] is a 33 y.o. [id number] at [redacted]w[redacted]d being seen today for ongoing prenatal care.  She is currently monitored for the following issues for this high-risk pregnancy and has IBS (irritable bowel syndrome); Pica; Supervision of high risk pregnancy, antepartum; Dichorionic diamniotic twin gestation; Anemia in pregnancy; Gestational diabetes mellitus, class A1; and Hemorrhoids during pregnancy on their problem list.  Patient reports general discomforts of pregnancy and hemmorroids. .  Contractions: Not present. Vag. Bleeding: None.  Movement: Present. Denies leaking of fluid.   The following portions of the patient's history were reviewed and updated as appropriate: allergies, current medications, past family history, past medical history, past social history, past surgical history and problem list. Problem list updated.  Objective:   Vitals:   12/29/18 1007  BP: 132/71  Pulse: 89  Weight: 187 lb 9.6 oz (85.1 kg)    Fetal Status:     Movement: Present     General:  Alert, oriented and cooperative. Patient is in no acute distress.  Skin: Skin is warm and dry. No rash noted.   Cardiovascular: Normal heart rate noted  Respiratory: Normal respiratory effort, no problems with respiration noted  Abdomen: Soft, gravid, appropriate for gestational age. Pain/Pressure: Present     Pelvic:  Cervical exam deferred        Extremities: Normal range of motion.  Edema: Trace  Mental Status: Normal mood and affect. Normal behavior. Normal judgment and thought content.   Urinalysis:      Assessment and Plan:  Pregnancy: [id number] at [redacted]w[redacted]d  1. Supervision of high risk pregnancy, antepartum Stable  NST Twin A : Baseline 135, + accels, no decels, no ut ctx Twin B: Baseline 130, + accels, no decels, no ut ctx  2. Screening for genitourinary condition  - POC Urinalysis Dipstick OB  3. Pregnant state, incidental  - POC Urinalysis Dipstick OB  4. Gestational diabetes mellitus,  class A1 CBG's in goal range Continue with diet and Metformin  5. Dichorionic diamniotic twin pregnancy in third trimester Stable NST's as above BPP next visit  6. Anemia during pregnancy in third trimester Continue with iron  7. Hemorrhoids during pregnancy in third trimester  - hydrocortisone (ANUSOL-HC) 25 MG suppository; Place 1 suppository (25 mg total) rectally 2 (two) times daily.  Dispense: 12 suppository; Refill: 1 - hydrocortisone 2.5 % cream; Apply topically 2 (two) times daily.  Dispense: 30 g; Refill: 1 - senna-docusate (SENOKOT-S) 8.6-50 MG tablet; Take 1 tablet by mouth daily.  Dispense: 30 tablet; Refill: 2  Preterm labor symptoms and general obstetric precautions including but not limited to vaginal bleeding, contractions, leaking of fluid and fetal movement were reviewed in detail with the patient. Please refer to After Visit Summary for other counseling recommendations.  Return in about 1 week (around 01/05/2019) for OB visit, face to face.   Chancy Milroy, MD

## 2019-01-06 ENCOUNTER — Other Ambulatory Visit: Payer: Self-pay | Admitting: Obstetrics and Gynecology

## 2019-01-06 DIAGNOSIS — O30003 Twin pregnancy, unspecified number of placenta and unspecified number of amniotic sacs, third trimester: Secondary | ICD-10-CM

## 2019-01-06 DIAGNOSIS — O24410 Gestational diabetes mellitus in pregnancy, diet controlled: Secondary | ICD-10-CM

## 2019-01-07 ENCOUNTER — Ambulatory Visit (INDEPENDENT_AMBULATORY_CARE_PROVIDER_SITE_OTHER): Payer: Medicaid Other

## 2019-01-07 ENCOUNTER — Encounter (HOSPITAL_COMMUNITY): Payer: Self-pay | Admitting: *Deleted

## 2019-01-07 ENCOUNTER — Inpatient Hospital Stay (HOSPITAL_COMMUNITY): Payer: Medicaid Other | Admitting: Anesthesiology

## 2019-01-07 ENCOUNTER — Other Ambulatory Visit (HOSPITAL_COMMUNITY)
Admission: RE | Admit: 2019-01-07 | Discharge: 2019-01-07 | Disposition: A | Discharge status: Home / Self Care | Payer: Medicaid Other | Source: Ambulatory Visit | Attending: Obstetrics & Gynecology | Admitting: Obstetrics & Gynecology

## 2019-01-07 ENCOUNTER — Ambulatory Visit (INDEPENDENT_AMBULATORY_CARE_PROVIDER_SITE_OTHER): Payer: Medicaid Other | Admitting: Obstetrics & Gynecology

## 2019-01-07 ENCOUNTER — Inpatient Hospital Stay (HOSPITAL_COMMUNITY)
Admission: AD | Admit: 2019-01-07 | Discharge: 2019-01-10 | DRG: 796 | Disposition: A | Discharge status: Home / Self Care | Payer: Medicaid Other | Source: Ambulatory Visit | Attending: Obstetrics and Gynecology | Admitting: Obstetrics and Gynecology

## 2019-01-07 ENCOUNTER — Other Ambulatory Visit: Payer: Self-pay

## 2019-01-07 VITALS — BP 121/74 | HR 93 | Wt 193.0 lb

## 2019-01-07 DIAGNOSIS — O0992 Supervision of high risk pregnancy, unspecified, second trimester: Secondary | ICD-10-CM | POA: Diagnosis not present

## 2019-01-07 DIAGNOSIS — O30043 Twin pregnancy, dichorionic/diamniotic, third trimester: Principal | ICD-10-CM | POA: Diagnosis present

## 2019-01-07 DIAGNOSIS — O24410 Gestational diabetes mellitus in pregnancy, diet controlled: Secondary | ICD-10-CM

## 2019-01-07 DIAGNOSIS — Z3A36 36 weeks gestation of pregnancy: Secondary | ICD-10-CM

## 2019-01-07 DIAGNOSIS — D649 Anemia, unspecified: Secondary | ICD-10-CM | POA: Diagnosis present

## 2019-01-07 DIAGNOSIS — O24419 Gestational diabetes mellitus in pregnancy, unspecified control: Secondary | ICD-10-CM

## 2019-01-07 DIAGNOSIS — O24425 Gestational diabetes mellitus in childbirth, controlled by oral hypoglycemic drugs: Secondary | ICD-10-CM | POA: Diagnosis present

## 2019-01-07 DIAGNOSIS — O30003 Twin pregnancy, unspecified number of placenta and unspecified number of amniotic sacs, third trimester: Secondary | ICD-10-CM

## 2019-01-07 DIAGNOSIS — Z87891 Personal history of nicotine dependence: Secondary | ICD-10-CM

## 2019-01-07 DIAGNOSIS — Z302 Encounter for sterilization: Secondary | ICD-10-CM | POA: Diagnosis not present

## 2019-01-07 DIAGNOSIS — O328XX2 Maternal care for other malpresentation of fetus, fetus 2: Secondary | ICD-10-CM | POA: Diagnosis not present

## 2019-01-07 DIAGNOSIS — O0990 Supervision of high risk pregnancy, unspecified, unspecified trimester: Secondary | ICD-10-CM

## 2019-01-07 DIAGNOSIS — Z20828 Contact with and (suspected) exposure to other viral communicable diseases: Secondary | ICD-10-CM | POA: Diagnosis present

## 2019-01-07 DIAGNOSIS — O322XX1 Maternal care for transverse and oblique lie, fetus 1: Secondary | ICD-10-CM | POA: Diagnosis not present

## 2019-01-07 DIAGNOSIS — Z362 Encounter for other antenatal screening follow-up: Secondary | ICD-10-CM | POA: Diagnosis not present

## 2019-01-07 DIAGNOSIS — O30049 Twin pregnancy, dichorionic/diamniotic, unspecified trimester: Secondary | ICD-10-CM | POA: Diagnosis present

## 2019-01-07 DIAGNOSIS — Z8632 Personal history of gestational diabetes: Secondary | ICD-10-CM

## 2019-01-07 DIAGNOSIS — O24429 Gestational diabetes mellitus in childbirth, unspecified control: Secondary | ICD-10-CM | POA: Diagnosis not present

## 2019-01-07 DIAGNOSIS — O0993 Supervision of high risk pregnancy, unspecified, third trimester: Secondary | ICD-10-CM

## 2019-01-07 DIAGNOSIS — Z331 Pregnant state, incidental: Secondary | ICD-10-CM

## 2019-01-07 DIAGNOSIS — O9902 Anemia complicating childbirth: Secondary | ICD-10-CM | POA: Diagnosis present

## 2019-01-07 DIAGNOSIS — Z1389 Encounter for screening for other disorder: Secondary | ICD-10-CM

## 2019-01-07 LAB — GLUCOSE, CAPILLARY
Glucose-Capillary: 78 mg/dL (ref 70–99)
Glucose-Capillary: 66 mg/dL — ABNORMAL LOW (ref 70–99)
Glucose-Capillary: 100 mg/dL — ABNORMAL HIGH (ref 70–99)
Glucose-Capillary: 87 mg/dL (ref 70–99)

## 2019-01-07 LAB — POCT URINALYSIS DIPSTICK OB
Blood, UA: NEGATIVE
Glucose, UA: NEGATIVE
Ketones, UA: NEGATIVE
Leukocytes, UA: NEGATIVE
Nitrite, UA: NEGATIVE

## 2019-01-07 LAB — CBC
HCT: 37.1 % (ref 36.0–46.0)
Hemoglobin: 11.6 g/dL — ABNORMAL LOW (ref 12.0–15.0)
MCH: 24.7 pg — ABNORMAL LOW (ref 26.0–34.0)
MCHC: 31.3 g/dL (ref 30.0–36.0)
MCV: 78.9 fL — ABNORMAL LOW (ref 80.0–100.0)
Platelets: 225 10*3/uL (ref 150–400)
RBC: 4.7 MIL/uL (ref 3.87–5.11)
RDW: 18.7 % — ABNORMAL HIGH (ref 11.5–15.5)
WBC: 11 10*3/uL — ABNORMAL HIGH (ref 4.0–10.5)
nRBC: 0 % (ref 0.0–0.2)

## 2019-01-07 LAB — SARS CORONAVIRUS 2 BY RT PCR (HOSPITAL ORDER, PERFORMED IN ~~LOC~~ HOSPITAL LAB): SARS Coronavirus 2: NEGATIVE

## 2019-01-07 LAB — POCT FERN TEST: POCT Fern Test: NEGATIVE

## 2019-01-07 MED ORDER — PHENYLEPHRINE 40 MCG/ML (10ML) SYRINGE FOR IV PUSH (FOR BLOOD PRESSURE SUPPORT): 80.0000 ug | PREFILLED_SYRINGE | INTRAVENOUS | Status: DC | PRN

## 2019-01-07 MED ORDER — LIDOCAINE HCL (PF) 1 % IJ SOLN
INTRAMUSCULAR | Status: DC | PRN
  Administered 2019-01-07: 6 mL via EPIDURAL

## 2019-01-07 MED ORDER — SODIUM CHLORIDE (PF) 0.9 % IJ SOLN
INTRAMUSCULAR | Status: DC | PRN
  Administered 2019-01-07: 12 mL/h via EPIDURAL

## 2019-01-07 MED ORDER — FENTANYL-BUPIVACAINE-NACL 0.5-0.125-0.9 MG/250ML-% EP SOLN
12.0000 mL/h | EPIDURAL | Status: DC | PRN
  Filled 2019-01-07: qty 250

## 2019-01-07 MED ORDER — OXYCODONE-ACETAMINOPHEN 5-325 MG PO TABS: 2.0000 | ORAL_TABLET | ORAL | Status: DC | PRN

## 2019-01-07 MED ORDER — LACTATED RINGERS IV SOLN
500.0000 mL | Freq: Once | INTRAVENOUS | Status: AC
  Administered 2019-01-07: 500 mL via INTRAVENOUS

## 2019-01-07 MED ORDER — FENTANYL-BUPIVACAINE-NACL 0.5-0.125-0.9 MG/250ML-% EP SOLN: 12.0000 mL/h | EPIDURAL | Status: DC | PRN

## 2019-01-07 MED ORDER — LACTATED RINGERS IV SOLN
INTRAVENOUS | Status: DC
  Administered 2019-01-07: 21:00:00 via INTRAVENOUS

## 2019-01-07 MED ORDER — EPHEDRINE 5 MG/ML INJ: 10.0000 mg | INTRAVENOUS | Status: DC | PRN

## 2019-01-07 MED ORDER — SODIUM CHLORIDE 0.9 % IV SOLN
5.0000 10*6.[IU] | Freq: Once | INTRAVENOUS | Status: AC
  Administered 2019-01-07: 5 10*6.[IU] via INTRAVENOUS
  Filled 2019-01-07: qty 5

## 2019-01-07 MED ORDER — METFORMIN HCL 500 MG PO TABS
500.0000 mg | ORAL_TABLET | Freq: Every day | ORAL | Status: DC
  Filled 2019-01-07: qty 1

## 2019-01-07 MED ORDER — LACTATED RINGERS IV SOLN
500.0000 mL | INTRAVENOUS | Status: DC | PRN
  Administered 2019-01-07: 1000 mL via INTRAVENOUS

## 2019-01-07 MED ORDER — LIDOCAINE HCL (PF) 1 % IJ SOLN: 30.0000 mL | INTRAMUSCULAR | Status: DC | PRN

## 2019-01-07 MED ORDER — OXYTOCIN BOLUS FROM INFUSION
500.0000 mL | Freq: Once | INTRAVENOUS | Status: AC
  Administered 2019-01-08: 500 mL via INTRAVENOUS

## 2019-01-07 MED ORDER — SOD CITRATE-CITRIC ACID 500-334 MG/5ML PO SOLN: 30.0000 mL | ORAL | Status: DC | PRN

## 2019-01-07 MED ORDER — DIPHENHYDRAMINE HCL 50 MG/ML IJ SOLN: 12.5000 mg | INTRAMUSCULAR | Status: DC | PRN

## 2019-01-07 MED ORDER — OXYCODONE-ACETAMINOPHEN 5-325 MG PO TABS: 1.0000 | ORAL_TABLET | ORAL | Status: DC | PRN

## 2019-01-07 MED ORDER — ACETAMINOPHEN 325 MG PO TABS: 650.0000 mg | ORAL_TABLET | ORAL | Status: DC | PRN

## 2019-01-07 MED ORDER — ONDANSETRON HCL 4 MG/2ML IJ SOLN: 4.0000 mg | Freq: Four times a day (QID) | INTRAMUSCULAR | Status: DC | PRN

## 2019-01-07 MED ORDER — PENICILLIN G POT IN DEXTROSE 60000 UNIT/ML IV SOLN
3.0000 10*6.[IU] | INTRAVENOUS | Status: DC
  Administered 2019-01-07 – 2019-01-08 (×2): 3 10*6.[IU] via INTRAVENOUS
  Filled 2019-01-07 (×2): qty 50

## 2019-01-07 MED ORDER — OXYTOCIN 40 UNITS IN NORMAL SALINE INFUSION - SIMPLE MED
2.5000 [IU]/h | INTRAVENOUS | Status: DC
  Administered 2019-01-08: 2.5 [IU]/h via INTRAVENOUS

## 2019-01-07 NOTE — Anesthesia Procedure Notes (Signed)
Epidural Patient location during procedure: OB Start time: 01/07/2019 6:58 PM End time: 01/07/2019 7:02 PM  Staffing Anesthesiologist: Janeece Riggers, MD  Preanesthetic Checklist Completed: patient identified, site marked, surgical consent, pre-op evaluation, timeout performed, IV checked, risks and benefits discussed and monitors and equipment checked  Epidural Patient position: sitting Prep: site prepped and draped and DuraPrep Patient monitoring: continuous pulse ox and blood pressure Approach: midline Location: L3-L4 Injection technique: LOR air  Needle:  Needle type: Tuohy  Needle gauge: 17 G Needle length: 9 cm and 9 Needle insertion depth: 6 cm Catheter type: closed end flexible Catheter size: 19 Gauge Catheter at skin depth: 11 cm Test dose: negative  Assessment Events: blood not aspirated, injection not painful, no injection resistance, negative IV test and no paresthesia

## 2019-01-07 NOTE — Progress Notes (Signed)
Labor Progress Note [name] is a 33 y.o. [id number] at [redacted]w[redacted]d presented for preterm labor.  S: Comfortable with epidural.   O:  BP 124/70   Pulse 71   Temp 98.1 F (36.7 C) (Oral)   Resp 16   Ht 5\' 4"  (1.626 m)   Wt 88.5 kg   LMP 04/26/2018   SpO2 99%   BMI 33.47 kg/m  EFM: A&B: 145-150, moderate variability, pos accels, no decels  CVE: Dilation: 6 Effacement (%): 70 Presentation: Vertex(confirmed  by U/S) Exam by:: L.Leftwich-Kirby,CNM   A&P: 33 y.o. [id number] [redacted]w[redacted]d here for preterm labor with [name] twins.  #Labor: Progressing well. Will consider AROM after adequate treatment with PCN now that patient is in active labor. #Pain: Epidural  #FWB: Both Cat I #GBS unknown; PCN since  #GDMA2: Previously on Metformin. CBG q2h. Last glucose 78. #Preterm: D/w Dr. Glo Herring. Due to late preterm status, unlikelihood of receiving full effect of steroids given this late and GDMA2 status, will not give BMZ.  [name], MD 8:30 PM

## 2019-01-07 NOTE — Progress Notes (Signed)
   HIGH-RISK PREGNANCY VISIT Patient name: [name] MRN [id number]  Date of birth: 1985/04/15 Chief Complaint:   Routine Prenatal Visit  History of Present Illness:   [name] is a 33 y.o. T2I7124 female at [redacted]w[redacted]d with an Estimated Date of Delivery: 01/31/19 being seen today for ongoing management of a high-risk pregnancy complicated by DiDi twins.  Today she reports no complaints. Contractions: Irregular. Vag. Bleeding: None.  Movement: Present. denies leaking of fluid.  Review of Systems:   Pertinent items are noted in HPI Denies abnormal vaginal discharge w/ itching/odor/irritation, headaches, visual changes, shortness of breath, chest pain, abdominal pain, severe nausea/vomiting, or problems with urination or bowel movements unless otherwise stated above. Pertinent History Reviewed:  Reviewed past medical,surgical, social, obstetrical and family history.  Reviewed problem list, medications and allergies. Physical Assessment:   Vitals:   01/07/19 0935  BP: 121/74  Pulse: 93  Weight: 193 lb (87.5 kg)  Body mass index is 33.13 kg/m.           Physical Examination:   General appearance: alert, well appearing, and in no distress  Mental status: alert, oriented to person, place, and time  Skin: warm & dry   Extremities:      Cardiovascular: normal heart rate noted  Respiratory: normal respiratory effort, no distress  Abdomen: gravid, soft, non-tender  Pelvic: Cervical exam performed         Fetal Status:     Movement: Present    Fetal Surveillance Testing today: BPP 8/8 x 2   Chaperone: Diona Fanti    Results for orders placed or performed in visit on 01/07/19 (from the past 24 hour(s))  POC Urinalysis Dipstick OB   Collection Time: 01/07/19  9:33 AM  Result Value Ref Range   Color, UA     Clarity, UA     Glucose, UA Negative Negative   Bilirubin, UA     Ketones, UA n    Spec Grav, UA     Blood, UA n    pH, UA     POC,PROTEIN,UA Trace Negative, Trace, Small  (1+), Moderate (2+), Large (3+), 4+   Urobilinogen, UA     Nitrite, UA n    Leukocytes, UA Negative Negative   Appearance     Odor      Assessment & Plan:  1) High-risk pregnancy [id number] at [redacted]w[redacted]d with an Estimated Date of Delivery: 01/31/19   2) DiDi twins, stable, BPP 8/8 x 2, cx 4.5 cm, pt given labor precautions    Meds: No orders of the defined types were placed in this encounter.   Labs/procedures today: sonogram  Treatment Plan:  IOL 38 weeks, will probably deliver this week  Reviewed: Term labor symptoms and general obstetric precautions including but not limited to vaginal bleeding, contractions, leaking of fluid and fetal movement were reviewed in detail with the patient.  All questions were answered.  home bp cuff. Rx faxed to . Check bp weekly, let us know if >140/90.   Follow-up: No follow-ups on file.  Orders Placed This Encounter  Procedures  . Strep Gp B NAA  . POC Urinalysis Dipstick OB   Florian Buff  01/07/2019 9:54 AM

## 2019-01-07 NOTE — MAU Note (Signed)
Started leaking at 0300, clear fluid.  Had appt at 0830, was 5cm.  No bleeding. irreg at appt.getting stronger.  Urge to do "#2".

## 2019-01-07 NOTE — H&P (Signed)
[name] is a 33 y.o. female [phone] at [redacted]w[redacted]d with [name] twins pregnancy presenting for leaking fluid and painful contractions.  She reports onset of painful contractions today and was 4.5 cm dilated in the office. She reported leaking of clear fluid that wet her underwear but did not require a pad.    Pregnancy is complicated by twins gestation, A2 GDM on Metformin since 12/22/18, anemia, and PICA--flour.    Admission for labor with cervix 6/70/-3, no evidence of ROM with no pooling and ferning slide negative.      FAMILY TREE  LAB RESULTS  Language English Pap 08/02/18: neg w/ -HRHPV  Initiated care at 9wk GC/CT Initial:  -/-          36wks:  Dating by LMP c/w 9wk U/S    Support person [employer] NT/IT:     AFP:      MaterniT21: normal females    [place]/HgbE 07/28/16 -  Flu vaccine 11/4 CF 07/28/16 -  TDaP vaccine 11/4 SMA declined  Rhogam  Fragile X declined       Anatomy US  Blood Type --/--/O POS (08/18 0935)  Feeding Plan  Antibody NEG (08/18 0935)  Contraception  HBsAg    Circumcision  RPR    Pediatrician  Rubella     Prenatal Classes  HIV      GTT/A1C Early:      26-28wks: 82/163/153  BTL Consent  GBS        [ ]  PCN allergy  VBAC Consent     Waterbirth [ ] Class [ ] Consent [ ] CNM visit PP Needs         OB History    Gravida  6   Para  4   Term  4   Preterm      AB  1   Living  4     SAB  1   TAB      Ectopic      Multiple  0   Live Births  4          Past Medical History:  Diagnosis Date  . Anemia   . Blood transfusion without reported diagnosis   . Dislocated shoulder 03/31/2013   recurrant   . IBS (irritable bowel syndrome)    with constipation  . Shoulder dislocation, recurrent    Past Surgical History:  Procedure Laterality Date  . DILATION AND CURETTAGE OF UTERUS  10/11/2017   incomplete abortion  . DILATION AND EVACUATION N/A 10/11/2017   Procedure: suction dilation and evacuation;  Surgeon: Sloan Leiter, MD;  Location: [facility];   Service: Gynecology;  Laterality: N/A;  . TUBAL LIGATION Bilateral 02/21/2017   Procedure: POST PARTUM TUBAL LIGATION;  Surgeon: Aletha Halim, MD;  Location: [facility];  Service: Gynecology;  Laterality: Bilateral;  . WISDOM TOOTH EXTRACTION     Family History: family history includes Asthma in her son; Cancer in her paternal aunt and paternal grandmother; Diabetes in her maternal aunt and sister. Social History:  reports that she quit smoking about 14 years ago. Her smoking use included cigarettes. She has never used smokeless tobacco. She reports that she does not drink alcohol or use drugs.     Maternal Diabetes: Yes:  Diabetes Type:  Insulin/Medication controlled Genetic Screening: Normal Maternal Ultrasounds/Referrals: Normal Fetal Ultrasounds or other Referrals:  None Maternal Substance Abuse:  No Significant Maternal Medications:  Meds include: Other:  Significant Maternal Lab Results:  Other:  Other Comments:  Metformin 500 mg Q HS and Pt GBS unknown, collected in office 01/07/19  Review of Systems  Constitutional: Negative for chills and fever.  Respiratory: Negative for shortness of breath.   Cardiovascular: Negative for chest pain.  Gastrointestinal: Positive for abdominal pain. Negative for constipation, diarrhea and vomiting.  Neurological: Negative for dizziness and headaches.  All other systems reviewed and are negative.  Maternal Medical History:  Reason for admission: Contractions.   Contractions: Onset was 3-5 hours ago.   Frequency: regular.   Perceived severity is moderate.    Fetal activity: Perceived fetal activity is normal.   Last perceived fetal movement was within the past hour.    Prenatal complications: DiDi twins, anemia  Prenatal Complications - Diabetes: gestational. Diabetes is managed by oral agent (monotherapy).      Dilation: 6 Effacement (%): 70 Exam by:: L.Leftwich-Kirby,CNM Blood pressure 126/77, pulse (!) 106,  temperature 98.5 F (36.9 C), resp. rate 20, height 5\' 4"  (1.626 m), weight 88.5 kg, last menstrual period 04/26/2018, SpO2 100 %, unknown if currently breastfeeding. Maternal Exam:  Uterine Assessment: Contraction strength is mild.  Contraction frequency is regular.   Abdomen: Fetal presentation: vertex Baby A is confirmed vertex by bedside 06/26/2018   Introitus: Ferning test: negative.   Cervix: Cervix evaluated by digital exam.     Fetal Exam Fetal Monitor Review: Mode: ultrasound.   Baseline rate: 155.  Variability: moderate (6-25 bpm).   Pattern: accelerations present and no decelerations.   [name] FHR baseline 150, moderate, with accels and no decels  Fetal State Assessment: Category I - tracings are normal.     Physical Exam  Nursing note and vitals reviewed. Constitutional: She is oriented to person, place, and time. She appears well-developed and well-nourished.  Neck: Normal range of motion.  Cardiovascular: Normal rate, regular rhythm and normal heart sounds.  Respiratory: Effort normal and breath sounds normal.  GI: Soft.  Musculoskeletal: Normal range of motion.  Neurological: She is alert and oriented to person, place, and time.  Skin: Skin is warm and dry.  Psychiatric: She has a normal mood and affect. Her behavior is normal. Judgment and thought content normal.    Prenatal labs: ABO, Rh: O/Positive/-- (06/08 1518) Antibody: Negative (09/01 0925) Rubella: 3.30 (06/08 1518) RPR: Non Reactive (09/01 0925)  HBsAg: Negative (06/08 1518)  HIV: Non Reactive (09/01 0925)  GBS:   Unknown, collected 01/07/19 in office  Assessment/Plan: 01/09/19 at [redacted]w[redacted]d Preterm labor with [redacted]w[redacted]d twins, Baby A confirmed vertex GBS unknown GDM on Metformin  Admit to L&D  Metformin daily CBGs Q 4 hours Expectant management on admission May have epidural when desired   June 01/07/2019, 4:43 PM

## 2019-01-07 NOTE — Anesthesia Preprocedure Evaluation (Signed)
Anesthesia Evaluation  Patient identified by MRN, date of birth, ID band Patient awake    Reviewed: Allergy & Precautions, H&P , NPO status , Patient's Chart, lab work & pertinent test results, reviewed documented beta blocker date and time   Airway Mallampati: II  TM Distance: >3 FB Neck ROM: full    Dental no notable dental hx.    Pulmonary neg pulmonary ROS, former smoker,    Pulmonary exam normal breath sounds clear to auscultation       Cardiovascular negative cardio ROS Normal cardiovascular exam Rhythm:regular Rate:Normal     Neuro/Psych negative neurological ROS  negative psych ROS   GI/Hepatic negative GI ROS, Neg liver ROS,   Endo/Other  diabetes, Gestational  Renal/GU negative Renal ROS  negative genitourinary   Musculoskeletal   Abdominal   Peds  Hematology  (+) Blood dyscrasia, anemia ,   Anesthesia Other Findings   Reproductive/Obstetrics (+) Pregnancy                             Anesthesia Physical Anesthesia Plan  ASA: III  Anesthesia Plan: Epidural   Post-op Pain Management:    Induction:   PONV Risk Score and Plan:   Airway Management Planned:   Additional Equipment:   Intra-op Plan:   Post-operative Plan:   Informed Consent: I have reviewed the patients History and Physical, chart, labs and discussed the procedure including the risks, benefits and alternatives for the proposed anesthesia with the patient or authorized representative who has indicated his/her understanding and acceptance.     Dental Advisory Given  Plan Discussed with:   Anesthesia Plan Comments: (Labs checked- platelets confirmed with RN in room. Fetal heart tracing, per RN, reported to be stable enough for sitting procedure. Discussed epidural, and patient consents to the procedure:  included risk of possible headache,backache, failed block, allergic reaction, and nerve injury. This  patient was asked if she had any questions or concerns before the procedure started.)        Anesthesia Quick Evaluation

## 2019-01-07 NOTE — Progress Notes (Signed)
[place] DI/DI TWINS 36+4 wks BABY A :cephalic right,fhr 553 bpm,anterior placenta gr 2,svp of fluid 3.4 cm,BPP 8/8,EFW 2967 g 53% [name]:transverse head right/ breech left,anterior placenta gr 2,svp of fluid 4.3 cm,fhr 129 bpm,BPP 8/8,EFW 2679 g 26%,discordance 9.7%

## 2019-01-08 ENCOUNTER — Encounter (HOSPITAL_COMMUNITY): Payer: Self-pay | Admitting: *Deleted

## 2019-01-08 ENCOUNTER — Inpatient Hospital Stay (HOSPITAL_COMMUNITY): Payer: Medicaid Other | Admitting: Anesthesiology

## 2019-01-08 ENCOUNTER — Encounter (HOSPITAL_COMMUNITY)
Admission: AD | Disposition: A | Discharge status: Home / Self Care | Payer: Self-pay | Source: Ambulatory Visit | Attending: Obstetrics and Gynecology

## 2019-01-08 DIAGNOSIS — O328XX2 Maternal care for other malpresentation of fetus, fetus 2: Secondary | ICD-10-CM

## 2019-01-08 DIAGNOSIS — O24425 Gestational diabetes mellitus in childbirth, controlled by oral hypoglycemic drugs: Secondary | ICD-10-CM

## 2019-01-08 DIAGNOSIS — O30043 Twin pregnancy, dichorionic/diamniotic, third trimester: Secondary | ICD-10-CM

## 2019-01-08 DIAGNOSIS — Z3A36 36 weeks gestation of pregnancy: Secondary | ICD-10-CM

## 2019-01-08 DIAGNOSIS — O322XX1 Maternal care for transverse and oblique lie, fetus 1: Secondary | ICD-10-CM

## 2019-01-08 DIAGNOSIS — Z302 Encounter for sterilization: Secondary | ICD-10-CM

## 2019-01-08 HISTORY — PX: TUBAL LIGATION: SHX77

## 2019-01-08 LAB — CBC
HCT: 34.8 % — ABNORMAL LOW (ref 36.0–46.0)
Hemoglobin: 11 g/dL — ABNORMAL LOW (ref 12.0–15.0)
MCH: 24.6 pg — ABNORMAL LOW (ref 26.0–34.0)
MCHC: 31.6 g/dL (ref 30.0–36.0)
MCV: 77.7 fL — ABNORMAL LOW (ref 80.0–100.0)
Platelets: 195 10*3/uL (ref 150–400)
RBC: 4.48 MIL/uL (ref 3.87–5.11)
RDW: 18.7 % — ABNORMAL HIGH (ref 11.5–15.5)
WBC: 11.4 10*3/uL — ABNORMAL HIGH (ref 4.0–10.5)
nRBC: 0 % (ref 0.0–0.2)

## 2019-01-08 LAB — GLUCOSE, CAPILLARY
Glucose-Capillary: 84 mg/dL (ref 70–99)
Glucose-Capillary: 76 mg/dL (ref 70–99)

## 2019-01-08 LAB — RPR: RPR Ser Ql: NONREACTIVE

## 2019-01-08 SURGERY — LIGATION, FALLOPIAN TUBE, POSTPARTUM
Anesthesia: Choice

## 2019-01-08 SURGERY — LIGATION, FALLOPIAN TUBE, POSTPARTUM
Anesthesia: Epidural | Site: Abdomen | Laterality: Bilateral

## 2019-01-08 MED ORDER — METOCLOPRAMIDE HCL 10 MG PO TABS
10.0000 mg | ORAL_TABLET | Freq: Once | ORAL | Status: AC
  Administered 2019-01-08: 10 mg via ORAL
  Filled 2019-01-08: qty 1

## 2019-01-08 MED ORDER — MIDAZOLAM HCL 2 MG/2ML IJ SOLN
INTRAMUSCULAR | Status: AC
  Filled 2019-01-08: qty 2

## 2019-01-08 MED ORDER — PRENATAL MULTIVITAMIN CH
1.0000 | ORAL_TABLET | Freq: Every day | ORAL | Status: DC
  Administered 2019-01-10: 1 via ORAL
  Filled 2019-01-08 (×2): qty 1

## 2019-01-08 MED ORDER — SENNOSIDES-DOCUSATE SODIUM 8.6-50 MG PO TABS
2.0000 | ORAL_TABLET | ORAL | Status: DC
  Administered 2019-01-08 – 2019-01-09 (×2): 2 via ORAL
  Filled 2019-01-08 (×2): qty 2

## 2019-01-08 MED ORDER — PROMETHAZINE HCL 25 MG/ML IJ SOLN: 6.2500 mg | INTRAMUSCULAR | Status: DC | PRN

## 2019-01-08 MED ORDER — FENTANYL CITRATE (PF) 100 MCG/2ML IJ SOLN
INTRAMUSCULAR | Status: DC | PRN
  Administered 2019-01-08 (×2): 50 ug via INTRAVENOUS

## 2019-01-08 MED ORDER — COCONUT OIL OIL: 1.0000 "application " | TOPICAL_OIL | Status: DC | PRN

## 2019-01-08 MED ORDER — LACTATED RINGERS IV SOLN: INTRAVENOUS | Status: DC

## 2019-01-08 MED ORDER — BENZOCAINE-MENTHOL 20-0.5 % EX AERO
1.0000 "application " | INHALATION_SPRAY | CUTANEOUS | Status: DC | PRN
  Administered 2019-01-08: 1 via TOPICAL
  Filled 2019-01-08: qty 56

## 2019-01-08 MED ORDER — ONDANSETRON HCL 4 MG/2ML IJ SOLN
INTRAMUSCULAR | Status: AC
  Filled 2019-01-08: qty 2

## 2019-01-08 MED ORDER — BUPIVACAINE HCL 0.5 % IJ SOLN
INTRAMUSCULAR | Status: DC | PRN
  Administered 2019-01-08: 30 mL

## 2019-01-08 MED ORDER — OXYTOCIN 40 UNITS IN NORMAL SALINE INFUSION - SIMPLE MED
1.0000 m[IU]/min | INTRAVENOUS | Status: DC
  Administered 2019-01-08: 2 m[IU]/min via INTRAVENOUS
  Filled 2019-01-08: qty 1000

## 2019-01-08 MED ORDER — LIDOCAINE-EPINEPHRINE (PF) 2 %-1:200000 IJ SOLN
INTRAMUSCULAR | Status: AC
  Filled 2019-01-08: qty 10

## 2019-01-08 MED ORDER — FENTANYL CITRATE (PF) 100 MCG/2ML IJ SOLN
INTRAMUSCULAR | Status: DC | PRN
  Administered 2019-01-08: 100 ug via EPIDURAL

## 2019-01-08 MED ORDER — LIDOCAINE-EPINEPHRINE (PF) 2 %-1:200000 IJ SOLN
INTRAMUSCULAR | Status: AC
  Filled 2019-01-08: qty 20

## 2019-01-08 MED ORDER — SODIUM BICARBONATE 8.4 % IV SOLN
INTRAVENOUS | Status: DC | PRN
  Administered 2019-01-08: 7 mL via EPIDURAL
  Administered 2019-01-08: 3 mL via EPIDURAL
  Administered 2019-01-08: 6 mL via EPIDURAL

## 2019-01-08 MED ORDER — DIPHENHYDRAMINE HCL 25 MG PO CAPS: 25.0000 mg | ORAL_CAPSULE | Freq: Four times a day (QID) | ORAL | Status: DC | PRN

## 2019-01-08 MED ORDER — ONDANSETRON HCL 4 MG/2ML IJ SOLN
INTRAMUSCULAR | Status: DC | PRN
  Administered 2019-01-08: 4 mg via INTRAVENOUS

## 2019-01-08 MED ORDER — FENTANYL CITRATE (PF) 100 MCG/2ML IJ SOLN
INTRAMUSCULAR | Status: AC
  Filled 2019-01-08: qty 2

## 2019-01-08 MED ORDER — SODIUM BICARBONATE 8.4 % IV SOLN
INTRAVENOUS | Status: AC
  Filled 2019-01-08: qty 50

## 2019-01-08 MED ORDER — SODIUM CHLORIDE 0.9 % IR SOLN
Status: DC | PRN
  Administered 2019-01-08: 1

## 2019-01-08 MED ORDER — OXYCODONE HCL 5 MG PO TABS: 5.0000 mg | ORAL_TABLET | Freq: Once | ORAL | Status: DC | PRN

## 2019-01-08 MED ORDER — KETOROLAC TROMETHAMINE 30 MG/ML IJ SOLN
INTRAMUSCULAR | Status: AC
  Filled 2019-01-08: qty 1

## 2019-01-08 MED ORDER — IBUPROFEN 600 MG PO TABS
600.0000 mg | ORAL_TABLET | Freq: Four times a day (QID) | ORAL | Status: DC
  Administered 2019-01-08 – 2019-01-10 (×9): 600 mg via ORAL
  Filled 2019-01-08 (×9): qty 1

## 2019-01-08 MED ORDER — FENTANYL CITRATE (PF) 100 MCG/2ML IJ SOLN: INTRAMUSCULAR | Status: DC | PRN

## 2019-01-08 MED ORDER — DIBUCAINE (PERIANAL) 1 % EX OINT: 1.0000 "application " | TOPICAL_OINTMENT | CUTANEOUS | Status: DC | PRN

## 2019-01-08 MED ORDER — FAMOTIDINE 20 MG PO TABS
40.0000 mg | ORAL_TABLET | Freq: Once | ORAL | Status: AC
  Administered 2019-01-08: 40 mg via ORAL
  Filled 2019-01-08: qty 2

## 2019-01-08 MED ORDER — LACTATED RINGERS IV SOLN
INTRAVENOUS | Status: DC | PRN
  Administered 2019-01-08 (×2): via INTRAVENOUS

## 2019-01-08 MED ORDER — TETANUS-DIPHTH-ACELL PERTUSSIS 5-2.5-18.5 LF-MCG/0.5 IM SUSP: 0.5000 mL | Freq: Once | INTRAMUSCULAR | Status: DC

## 2019-01-08 MED ORDER — BUPIVACAINE HCL (PF) 0.5 % IJ SOLN
INTRAMUSCULAR | Status: AC
  Filled 2019-01-08: qty 30

## 2019-01-08 MED ORDER — WITCH HAZEL-GLYCERIN EX PADS: 1.0000 "application " | MEDICATED_PAD | CUTANEOUS | Status: DC | PRN

## 2019-01-08 MED ORDER — ZOLPIDEM TARTRATE 5 MG PO TABS: 5.0000 mg | ORAL_TABLET | Freq: Every evening | ORAL | Status: DC | PRN

## 2019-01-08 MED ORDER — SIMETHICONE 80 MG PO CHEW
80.0000 mg | CHEWABLE_TABLET | ORAL | Status: DC | PRN
  Administered 2019-01-09: 80 mg via ORAL
  Filled 2019-01-08: qty 1

## 2019-01-08 MED ORDER — ACETAMINOPHEN 325 MG PO TABS
650.0000 mg | ORAL_TABLET | ORAL | Status: DC | PRN
  Administered 2019-01-08 – 2019-01-10 (×5): 650 mg via ORAL
  Filled 2019-01-08 (×5): qty 2

## 2019-01-08 MED ORDER — TERBUTALINE SULFATE 1 MG/ML IJ SOLN: 0.2500 mg | Freq: Once | INTRAMUSCULAR | Status: DC | PRN

## 2019-01-08 MED ORDER — OXYCODONE HCL 5 MG/5ML PO SOLN: 5.0000 mg | Freq: Once | ORAL | Status: DC | PRN

## 2019-01-08 MED ORDER — ONDANSETRON HCL 4 MG PO TABS: 4.0000 mg | ORAL_TABLET | ORAL | Status: DC | PRN

## 2019-01-08 MED ORDER — ONDANSETRON HCL 4 MG/2ML IJ SOLN: 4.0000 mg | INTRAMUSCULAR | Status: DC | PRN

## 2019-01-08 MED ORDER — KETOROLAC TROMETHAMINE 30 MG/ML IJ SOLN
30.0000 mg | Freq: Once | INTRAMUSCULAR | Status: AC | PRN
  Administered 2019-01-08: 30 mg via INTRAVENOUS

## 2019-01-08 MED ORDER — MIDAZOLAM HCL 5 MG/5ML IJ SOLN
INTRAMUSCULAR | Status: DC | PRN
  Administered 2019-01-08 (×2): 1 mg via INTRAVENOUS

## 2019-01-08 MED ORDER — HYDROMORPHONE HCL 1 MG/ML IJ SOLN: 0.2500 mg | INTRAMUSCULAR | Status: DC | PRN

## 2019-01-08 SURGICAL SUPPLY — 33 items
ADH SKN CLS APL DERMABOND .7 (GAUZE/BANDAGES/DRESSINGS) ×1
CLIP FILSHIE TUBAL LIGA STRL (Clip) ×4 IMPLANT
CLOTH BEACON ORANGE TIMEOUT ST (SAFETY) ×2 IMPLANT
COVER LIGHT HANDLE  1/PK (MISCELLANEOUS) ×1
COVER LIGHT HANDLE 1/PK (MISCELLANEOUS) ×1 IMPLANT
DERMABOND ADVANCED (GAUZE/BANDAGES/DRESSINGS) ×1
DERMABOND ADVANCED .7 DNX12 (GAUZE/BANDAGES/DRESSINGS) ×1 IMPLANT
DRSG OPSITE POSTOP 3X4 (GAUZE/BANDAGES/DRESSINGS) ×2 IMPLANT
DRSG OPSITE POSTOP 4X10 (GAUZE/BANDAGES/DRESSINGS) ×1 IMPLANT
DURAPREP 26ML APPLICATOR (WOUND CARE) ×2 IMPLANT
ELECT REM PT RETURN 9FT ADLT (ELECTROSURGICAL) ×2
ELECTRODE REM PT RTRN 9FT ADLT (ELECTROSURGICAL) ×1 IMPLANT
GLOVE BIO SURGEON STRL SZ7 (GLOVE) ×2 IMPLANT
GLOVE BIOGEL PI IND STRL 7.0 (GLOVE) ×1 IMPLANT
GLOVE BIOGEL PI IND STRL 7.5 (GLOVE) ×1 IMPLANT
GLOVE BIOGEL PI INDICATOR 7.0 (GLOVE) ×1
GLOVE BIOGEL PI INDICATOR 7.5 (GLOVE) ×1
GOWN STRL REUS W/TWL LRG LVL3 (GOWN DISPOSABLE) ×4 IMPLANT
NEEDLE HYPO 22GX1.5 SAFETY (NEEDLE) ×2 IMPLANT
NS IRRIG 1000ML POUR BTL (IV SOLUTION) ×2 IMPLANT
PACK ABDOMINAL MINOR (CUSTOM PROCEDURE TRAY) ×2 IMPLANT
PENCIL BUTTON HOLSTER BLD 10FT (ELECTRODE) IMPLANT
PROTECTOR NERVE ULNAR (MISCELLANEOUS) ×2 IMPLANT
SPONGE LAP 18X18 RF (DISPOSABLE) ×1 IMPLANT
SPONGE LAP 4X18 RFD (DISPOSABLE) ×2 IMPLANT
SUT MNCRL AB 4-0 PS2 18 (SUTURE) ×2 IMPLANT
SUT PLAIN 2 0 (SUTURE)
SUT PLAIN ABS 2-0 CT1 27XMFL (SUTURE) IMPLANT
SUT VICRYL 0 TIES 12 18 (SUTURE) IMPLANT
SUT VICRYL 0 UR6 27IN ABS (SUTURE) ×2 IMPLANT
SYR CONTROL 10ML LL (SYRINGE) ×2 IMPLANT
TOWEL OR 17X24 6PK STRL BLUE (TOWEL DISPOSABLE) ×4 IMPLANT
TRAY FOLEY CATH SILVER 14FR (SET/KITS/TRAYS/PACK) ×2 IMPLANT

## 2019-01-08 NOTE — Anesthesia Postprocedure Evaluation (Signed)
Anesthesia Post Note  Patient: FRENCHIE PRIBYL  Procedure(s) Performed: POST PARTUM TUBAL LIGATION (Bilateral Abdomen)     Patient location during evaluation: PACU Anesthesia Type: Epidural Level of consciousness: awake and alert Pain management: pain level controlled Vital Signs Assessment: post-procedure vital signs reviewed and stable Respiratory status: spontaneous breathing, nonlabored ventilation and respiratory function stable Cardiovascular status: blood pressure returned to baseline and stable Postop Assessment: no apparent nausea or vomiting Anesthetic complications: no    Last Vitals:  Vitals:   01/08/19 1630 01/08/19 1645  BP: 114/68 115/74  Pulse: 62 72  Resp: (!) 26 14  Temp:    SpO2: 93% 98%    Last Pain:  Vitals:   01/08/19 1645  TempSrc:   PainSc: 3    Pain Goal: Patients Stated Pain Goal: 6 (01/07/19 1810)              Epidural/Spinal Function Cutaneous sensation: Pins and Needles (01/08/19 1645), Patient able to flex knees: Yes (01/08/19 1645), Patient able to lift hips off bed: Yes (01/08/19 1645), Back pain beyond tenderness at insertion site: No (01/08/19 1645), Progressively worsening motor and/or sensory loss: No (01/08/19 1645), Bowel and/or bladder incontinence post epidural: No (01/08/19 1645)  Lynda Rainwater

## 2019-01-08 NOTE — Progress Notes (Signed)
OB Note D/w pt re: r/b/a, namely surgical risks and permanency of procedure and she would like ppBTL. Consent signed. BTL papers UTD. Can proceed later today with procedure.   Durene Romans MD Attending Center for [employer] (Faculty Practice) 01/08/2019 Time: 6301

## 2019-01-08 NOTE — Anesthesia Preprocedure Evaluation (Signed)
Anesthesia Evaluation  Patient identified by MRN, date of birth, ID band Patient awake    Reviewed: Allergy & Precautions, H&P , NPO status , Patient's Chart, lab work & pertinent test results, reviewed documented beta blocker date and time   Airway Mallampati: II  TM Distance: >3 FB Neck ROM: full    Dental no notable dental hx.    Pulmonary neg pulmonary ROS, former smoker,    Pulmonary exam normal breath sounds clear to auscultation       Cardiovascular negative cardio ROS Normal cardiovascular exam Rhythm:regular Rate:Normal     Neuro/Psych negative neurological ROS  negative psych ROS   GI/Hepatic negative GI ROS, Neg liver ROS,   Endo/Other  diabetes, Gestational  Renal/GU negative Renal ROS  negative genitourinary   Musculoskeletal   Abdominal   Peds  Hematology  (+) Blood dyscrasia, anemia ,   Anesthesia Other Findings   Reproductive/Obstetrics                             Anesthesia Physical  Anesthesia Plan  ASA: III  Anesthesia Plan: Epidural   Post-op Pain Management:    Induction:   PONV Risk Score and Plan: Treatment may vary due to age or medical condition  Airway Management Planned: Natural Airway  Additional Equipment:   Intra-op Plan:   Post-operative Plan:   Informed Consent: I have reviewed the patients History and Physical, chart, labs and discussed the procedure including the risks, benefits and alternatives for the proposed anesthesia with the patient or authorized representative who has indicated his/her understanding and acceptance.     Dental Advisory Given  Plan Discussed with:   Anesthesia Plan Comments:         Anesthesia Quick Evaluation

## 2019-01-08 NOTE — Transfer of Care (Signed)
Immediate Anesthesia Transfer of Care Note  Patient: [name]  Procedure(s) Performed: POST PARTUM TUBAL LIGATION (Bilateral Abdomen)  Patient Location: PACU  Anesthesia Type:Epidural  Level of Consciousness: awake, alert  and oriented  Airway & Oxygen Therapy: Patient Spontanous Breathing  Post-op Assessment: Report given to RN and Post -op Vital signs reviewed and stable  Post vital signs: Reviewed and stable  Last Vitals:  Vitals Value Taken Time  BP 116/60 01/08/19 1537  Temp    Pulse 73 01/08/19 1539  Resp 21 01/08/19 1539  SpO2 96 % 01/08/19 1539  Vitals shown include unvalidated device data.  Last Pain:  Vitals:   01/08/19 1409  TempSrc: Oral  PainSc:       Patients Stated Pain Goal: 6 (21/11/55 2080)  Complications: No apparent anesthesia complications

## 2019-01-08 NOTE — Consult Note (Signed)
Delivery Note:  SVD    01/08/2019  2:44 AM  I was called to the delivery room at the request of the patient's obstetrician (Dr. Glo Herring) for twin delivery at [redacted] weeks gestation.  PRENATAL HX:  This is a 33 y/o [id number] at 73 and 5/[redacted] weeks gestation with di-di twins who was admitted in labor without evidence of ROM.  Her pregnancy has been complicated by A2 GDM (on Metformin).  She is GBS unknown but received adequate prophylaxis.  AROM x2 hours.      DELIVERY INFANT A:  Infant was vigorous at delivery, requiring no resuscitation other than standard warming, drying and stimulation.  APGARs 8 and 9.  Exam within normal limits.  After 5 minutes, baby left with nurse to assist parents with skin-to-skin care.   DELIVERY INFANT B:  Infant was vigorous at delivery, requiring no resuscitation other than standard warming, drying and stimulation.  APGARs 8 and 9.  Exam within normal limits.  After 5 minutes, baby left with nurse to assist parents with skin-to-skin care.  _____________________ Electronically Signed By: Clinton Gallant, MD Neonatologist

## 2019-01-08 NOTE — Progress Notes (Signed)
Labor Progress Note [name] is a 33 y.o. [id number] at [redacted]w[redacted]d presented for preterm labor.  S: Comfortable with epidural.   O:  BP 132/70   Pulse 72   Temp 98.6 F (37 C) (Oral)   Resp 18   Ht 5\' 4"  (1.626 m)   Wt 88.5 kg   LMP 04/26/2018   SpO2 99%   BMI 33.47 kg/m  EFM: A&B: 145-150, moderate variability, pos accels, no decels  CVE: Dilation: 7 Effacement (%): 80 Cervical Position: Middle Station: -2 Presentation: Vertex Exam by:: [name]   A&P: 33 y.o. [id number] [redacted]w[redacted]d here for preterm labor with [name] twins.  #Labor: Progressing well. AROM at this check of Twin A with clear fluid. SVE in 2 hours or as clinically indicated. Can start Pitocin as needed for augmentation. Will plan to start Pit after Twin A delivers. #Pain: Epidural  #FWB: Both Cat I #GBS unknown; PCN since  #GDMA2: Previously on Metformin. CBG q2h. Last glucose 87. #Preterm: D/w Dr. Glo Herring. Due to late preterm status, unlikelihood of receiving full effect of steroids given this late and GDMA2 status, will not give BMZ.  [name], MD 12:18 AM

## 2019-01-08 NOTE — Discharge Summary (Addendum)
Postpartum Discharge Summary    Patient Name: [name] DOB: [birth year] MRN: [id number]  Date of admission: 01/07/2019 Delivering Provider:    Rockell, Faulks [facility] [[id number]]  Marylynn, Rigdon [facility] [[id number]]  Barrington Ellison N    Date of discharge: 01/10/2019  Admitting diagnosis: CTX than less five Intrauterine pregnancy: [redacted]w[redacted]d    Secondary diagnosis:  Active Problems:   Dichorionic diamniotic twin gestation   Gestational diabetes mellitus, class A2   Preterm labor without delivery, third trimester   [redacted] weeks gestation of pregnancy  Additional problems: Undesired fertility     Discharge diagnosis: Preterm Pregnancy Delivered and Di/Di twins                                                                                                Post partum procedures:postpartum tubal ligation  Augmentation: AROM  Complications: None  Hospital course:  Onset of Labor With Vaginal Delivery     33y.o. yo [id number] 35w5das admitted in Active Labor on 01/07/2019. Patient had an uncomplicated labor course as follows. Patient presented to L&D for SOL. Initial SVE: 6/70/-3. Patient had AROM and no further augmentation. She then progressed to complete. Baby A delivered vertex and [name] delivered double footling breech. Membrane Rupture Time/Date:    NeJeena, ArnetthAngelina [facility]0[[id number]]12:10 AM    NeEllerie, ArenzhB[facility]thed[facility]0[[id number]]2:47 AM  ,   NeObelia, BonellohB[facility]0[[id number]]01/08/2019    NeTatjana, Turcott0[[id number]]01/08/2019    Intrapartum Procedures: Episiotomy:    NeJinelle, ButchkohMay[facility] Hospital0[[id number]]None [1]    NeShatyra, BeckahHaven [facility]0[[id number]]None [1]                                          Lacerations:     NeRana, HochsteinhAs[facility]0[[id number]]    NeMarysue, FaithDi[facility] Center0[[id number]]None [1];Perineal [11]   Patient had a delivery of a Viable infant.   NeTracy, GerkenhDecatur [facility]0[[id number]]01/08/2019    NeLuevenia, Mcavoy0[[id number]]    NeNorena, Bratton0[[id number]]01/08/2019  Information for the patient's newborn:  NeTamie, Minteer0[[id number]]Delivery Method: Vaginal, Spontaneous(Filed from Delivery Summary)  Information for the patient's newborn:  NeTatiyanna, Lashley0[[id number]]Delivery Method: Vag-Spont     Pateint had an uncomplicated postpartum course. Received BTL post-partum. Fasting AM glucose 77. She is ambulating, tolerating a regular diet, passing flatus, and urinating well. Patient is discharged home in stable condition on 01/10/19.  Delivery time:    NeShalynn, Jorstad0[[id number]]2:40 AM    NeDoniesha, Landau0[[id number]]   NeJazmina, MuhlenkamphUnity Point Health Trinity0[[id number]]2:48 AM    Magnesium Sulfate received: No BMZ received: No Rhophylac:No MMR:No Transfusion:No  Physical exam  Vitals:   01/09/19 0256 01/09/19 0605 01/09/19 1419 01/09/19 2055  BP: 123/79 120/78 128/82 119/75  Pulse: 66 61 78 65  Resp: 18  _0 Temp: 98 F (36.7 C) 98.2 F (36.8 C) 98.2 F (36.8 C) 98 F (36.7 C)  TempSrc: Oral Oral Oral Oral  SpO2: 99% 99% 100% 100%  Weight:      Height:       General: alert, cooperative and no distress Lochia: appropriate Uterine Fundus: firm Incision: Healing well with no significant drainage (from BTL) DVT Evaluation: No evidence of DVT seen on physical exam. Labs: Lab Results  Component Value Date   WBC 11.4 (H) 01/08/2019   HGB 11.0 (L) 01/08/2019   HCT 34.8 (L) 01/08/2019   MCV 77.7 (L) 01/08/2019   PLT 195 01/08/2019   CMP Latest Ref Rng & Units 11/17/2016  Glucose 65 - 99 mg/dL 114(H)  BUN 6 - 20 mg/dL 7  Creatinine 0.44 - 1.00 mg/dL 0.53  Sodium 135 - 145 mmol/L 131(L)  Potassium 3.5 - 5.1 mmol/L 3.0(L)  Chloride 101 - 111 mmol/L 100(L)  CO2 22 - 32 mmol/L 24  Calcium 8.9 - 10.3 mg/dL 8.5(L)  Total Protein 6.5 - 8.1 g/dL 6.8  Total Bilirubin 0.3 - 1.2 mg/dL 0.6  Alkaline Phos 38 - 126 U/L 65  AST 15 - 41 U/L 17  ALT 14 - 54 U/L 10(L)    Discharge  instruction: per After Visit Summary and "Baby and Me Booklet".  After visit meds:  Allergies as of 01/10/2019      Reactions   Sulfa Antibiotics Rash      Medication List    STOP taking these medications   Accu-Chek FastClix Lancets Misc   Accu-Chek Guide Me w/Device Kit   Accu-Chek Guide test strip Generic drug: glucose blood   metFORMIN 500 MG tablet Commonly known as: GLUCOPHAGE   senna-docusate 8.6-50 MG tablet Commonly known as: Senokot-S     TAKE these medications   acetaminophen 325 MG tablet Commonly known as: Tylenol Take 2 tablets (650 mg total) by mouth every 6 (six) hours as needed (for pain scale < 4).   ferrous sulfate 325 (65 FE) MG tablet Take 1 tablet (325 mg total) by mouth 2 (two) times daily with a meal.   hydrocortisone 2.5 % cream Apply topically 2 (two) times daily.   hydrocortisone 25 MG suppository Commonly known as: ANUSOL-HC Place 1 suppository (25 mg total) rectally 2 (two) times daily.   ibuprofen 600 MG tablet Commonly known as: ADVIL Take 1 tablet (600 mg total) by mouth every 6 (six) hours as needed for mild pain.   oxyCODONE 5 MG immediate release tablet Commonly known as: Oxy IR/ROXICODONE Take 1 tablet (5 mg total) by mouth every 4 (four) hours as needed for moderate pain.   prenatal vitamin w/FE, FA 27-1 MG Tabs tablet Take 1 tablet by mouth daily at 12 noon.   witch hazel-glycerin pad Commonly known as: TUCKS Apply 1 application topically as needed for itching.       Diet: routine diet  Activity: Advance as tolerated. Pelvic rest for 6 weeks.   Outpatient follow up:4 weeks Follow up Appt:No future appointments. Follow up Visit:   Please schedule this patient for Postpartum visit in: 4 weeks with the following provider: Any provider High risk pregnancy complicated by: twins and GDM Delivery mode:  SVD Anticipated Birth Control:  BTL done PP PP Procedures needed: 2 hour GTT  Schedule Integrated BH visit:  no    Newborn Data:   Audrina, Marten [[id number]]  Live born female  Birth Weight: 2585g  APGAR: 8, 9  Newborn Delivery   Birth date/time: 01/08/2019 02:40:00 Delivery type: Vaginal, Spontaneous              Monesha, Monreal [[id number]]  Live born female  Birth Weight: 5 lb 4.3 oz (2390 g) APGAR: 8, 9  Newborn Delivery   Birth date/time: 01/08/2019 02:48:00 Delivery type: Vaginal, Spontaneous      Baby Feeding: Bottle Disposition:home with mother   01/10/2019 [name], MD

## 2019-01-08 NOTE — Progress Notes (Signed)
2026 - CBG - 66, pt asymptomatic, apple juice given and repeated - 100. Will continue to monitor

## 2019-01-08 NOTE — Op Note (Signed)
Operative Note   01/08/2019  PRE-OP DIAGNOSIS: Desire for permanent sterilization.  Postpartum Day #0   POST-OP DIAGNOSIS: Same  SURGEON: Surgeon(s) and Role:    * Aletha Halim, MD - Primary  ASSISTANT: None  ANESTHESIA: epidural and local  PROCEDURE: mini-laparotomy, bilateral tubal ligation Filshie Clips method  ESTIMATED BLOOD LOSS: 43mL  DRAINS: per anesthesia note  TOTAL IV FLUIDS: per anesthesia note  SPECIMENS: none  VTE PROPHYLAXIS: SCDs to the bilateral lower extremities  ANTIBIOTICS: not indicated  COMPLICATIONS: none  DISPOSITION: PACU - hemodynamically stable.  CONDITION: stable  FINDINGS: No intra-abdominal adhesions noted. Smooth, normally contoured uterine fundus and bilateral tubes. Normal appearing tubes and normal ovaries on palpation.  PROCEDURE IN DETAIL: The patient was taken to the OR where anesthesia was administed. The patient was positioned in dorsal supine. The patient was prepped and draped in the normal sterile fashion a 4cm horizontal incision was made approximately 2 finger breadths below the umbilicus, after injection with local anesthesia. The skin was then incised with the scalpel and the underlying tissue dissected with the bovie and the fascia nicked in the midline with the scalpel and then extended laterally sharply.  The abdomen was then entered bluntly and a moist lap sponge used to displace the bowel.   The left Fallopian tube was identified by tracing out to the fimbraie, grasped with the Babcock clamps. An avascular midsection of the tube approximately 3-4cm from the cornua was grasped with the babcock clamps and the filshie clip was applied, taking care to incorporate the entire tube.  Attention was then turned to the right fallopian tube after confirmation by tracing the tube out to the fimbriae. The same procedure was then performed on the right Fallopian tube, with excellent hemostasis was noted from both BTL sites.   The lap  sponge was then removed from the abdomen and the fascia closed in running fashion with 0 vicryl. The skin was then closed with 4-0 monocryl.   The patient tolerated the procedure well. All counts were correct x 2. The patient was transferred to the recovery room awake, alert and breathing independently.   Durene Romans MD Attending Center for [employer] Fish farm manager)

## 2019-01-08 NOTE — Plan of Care (Signed)
  Problem: Education: Goal: Ability to state and carry out methods to decrease the pain will improve Outcome: Completed/Met Goal: Individualized Educational Video(s) Outcome: Completed/Met   Problem: Life Cycle: Goal: Ability to make normal progression through stages of labor will improve Outcome: Completed/Met Goal: Ability to effectively push during vaginal delivery will improve Outcome: Completed/Met   Problem: Role Relationship: Goal: Will demonstrate positive interactions with the child Outcome: Completed/Met   Problem: Safety: Goal: Risk of complications during labor and delivery will decrease Outcome: Completed/Met   Problem: Pain Management: Goal: Relief or control of pain from uterine contractions will improve Outcome: Completed/Met

## 2019-01-08 NOTE — Lactation Note (Signed)
This note was copied from a baby's chart. Lactation Consultation Note  Patient Name: [name] [id number] Date: 01/08/2019 Reason for consult: Late-preterm 34-36.6wks;Infant < 6lbs;1st time breastfeeding  P6 mother whose infant twin girls are now 77 hours old.  These are LPTI girls with a gestational age of 36+5 weeks weighing < 6 lbs.  Mother's feeding preference on admission was breast/bottle.  Mother did not breast feed her other 4 children (ages 76, 90, 60 and 2).  Mother and grandmother were each holding a swaddled infant when I arrived.  Mother had no questions regarding breast feeding at this time.  Reviewed the LPTI policy in depth with both of them.  Encouraged a lot of STS with the babies.  Developed a feeding plan and educated on feeding cues, hand expression, supplementing with mother's EBM prior to using any formula, how to awaken a sleepy baby, recording of feeds, voids/stools, and calling for assistance immediately if the babies do not want to feed at all with the supplementation.  Provided the advantages to beginning pumping with the DEBP and mother willing to begin.  Pump parts, assembly, disassembly and cleaning reviewed.  Mother was able to return demonstrate putting the pump together.  Observed her pumping for approximately 10 minutes while continuing to educate on breast feeding basics.  #24 flange size changed to a #27 flange size for greater fit and comfort.  Colostrum container provided and milk storage times reviewed.  Finger feeding demonstrated.  Mother does not have a DEBP for home use.  Explained the importance of having a pump at discharge.  Offered to fax a [place] referral to mother's home county of [place] and, initially, she gave me permisission.  However, after some thought she asked me not to do this yet because she may end up giving formula and wanted to "see how things would go in the hospital."  She verbalized that [facility] will not give her formula if she takes  the DEBP.  I acknowledged her wishes and asked her to alert any LC to faxing a referral if she changes her mind.  Also explained about our [facility] loaner program.  Mother verbalized understanding.    Encouraged mother to call for latch assistance as needed or for any further questions/concerns.  Mother is scheduled to go for a tubal ligation today at 1200.  Asked her to pump again prior to leaving for this if possible.  RN in room and updated.  Mom made aware of O/P services, breastfeeding support groups, community resources, and our phone # for post-discharge questions. Grandmother present and supportive.   Maternal Data Formula Feeding for Exclusion: Yes Reason for exclusion: Mother's choice to formula and breast feed on admission Has patient been taught Hand Expression?: Yes Does the patient have breastfeeding experience prior to this delivery?: No  Feeding Feeding Type: Bottle Fed - Formula Nipple Type: Slow - flow  LATCH Score                   Interventions    Lactation Tools Discussed/Used WIC Program: Yes Pump Review: Setup, frequency, and cleaning;Milk Storage Initiated by:: [name] Date initiated:: 01/08/19   Consult Status Consult Status: Follow-up Date: 01/09/19 Follow-up type: In-patient    [name] [name] 01/08/2019, 9:49 AM

## 2019-01-08 NOTE — Anesthesia Postprocedure Evaluation (Signed)
Anesthesia Post Note  Patient: TAELAR GRONEWOLD  Procedure(s) Performed: AN AD [place]     Patient location during evaluation: Mother Baby Anesthesia Type: Epidural Level of consciousness: awake and alert, oriented and patient cooperative Pain management: pain level controlled Vital Signs Assessment: post-procedure vital signs reviewed and stable Respiratory status: spontaneous breathing Cardiovascular status: stable Postop Assessment: no headache, epidural receding, patient able to bend at knees and no signs of nausea or vomiting Anesthetic complications: no Comments: Pt. States she is bending her knees. Pain score 4.     Last Vitals:  Vitals:   01/08/19 1715 01/08/19 1815  BP: 97/74 116/75  Pulse: 62 63  Resp: 16 18  Temp: 36.5 C 36.7 C  SpO2:      Last Pain:  Vitals:   01/08/19 1815  TempSrc: Oral  PainSc:    Pain Goal: Patients Stated Pain Goal: 6 (01/07/19 1810)                 Rico Sheehan

## 2019-01-09 LAB — STREP GP B NAA: Strep Gp B NAA: POSITIVE — AB

## 2019-01-09 LAB — GLUCOSE, CAPILLARY: Glucose-Capillary: 77 mg/dL (ref 70–99)

## 2019-01-09 MED ORDER — OXYCODONE HCL 5 MG PO TABS
5.0000 mg | ORAL_TABLET | ORAL | Status: DC | PRN
  Administered 2019-01-09 – 2019-01-10 (×3): 5 mg via ORAL
  Filled 2019-01-09 (×3): qty 1

## 2019-01-09 NOTE — Progress Notes (Addendum)
POSTPARTUM PROGRESS NOTE  Post Partum and post op Day 1  Subjective:  [name] is a 33 y.o. [id number] s/p VD and BTL at [redacted]w[redacted]d.  She reports she is doing well. No acute events overnight. She denies any problems with ambulating, voiding or po intake. Denies nausea or vomiting.  Patient reports flatus but no bowel movements pain is well controlled.  Lochia is appropriate.  Objective: Blood pressure 120/78, pulse 61, temperature 98.2 F (36.8 C), temperature source Oral, resp. rate 16, height 5\' 4"  (1.626 m), weight 88.5 kg, last menstrual period 04/26/2018, SpO2 99 %, unknown if currently breastfeeding.  Physical Exam:  General: alert, cooperative and no distress Chest: no respiratory distress Heart:regular rate, distal pulses intact Abdomen: soft, nontender, incision site is clean, dry, intact, no erythema Uterine Fundus: firm, appropriately tender DVT Evaluation: No calf swelling or tenderness Extremities: No lower extremity edema Skin: warm, dry  Recent Labs    01/07/19 1650 01/08/19 0607  HGB 11.6* 11.0*  HCT 37.1 34.8*    Assessment/Plan: [name] is a 33 y.o. [id number] s/p VD and BTL at [redacted]w[redacted]d   PPD#1 - Doing well  Routine postpartum care  Contraception: S/p bilateral tubal ligation.  Pain is well controlled.  Continue to monitor for signs of infection Feeding: Bottle Dispo: Plan for discharge tomorrow.   LOS: 2 days   Gifford Shave, MD  PGY-1, [facility]  01/09/2019, 7:44 AM   I saw and evaluated the patient. I agree with the findings and the plan of care as documented in the resident's note. Fasting AM glucose 77.  Barrington Ellison, MD [facility] Fellow, [facility] for [employer], [place]

## 2019-01-09 NOTE — Anesthesia Postprocedure Evaluation (Signed)
Anesthesia Post Note  Patient: [name]  Procedure(s) Performed: AN AD [place]     Patient location during evaluation: Mother Baby Anesthesia Type: Epidural Level of consciousness: awake and alert and oriented Pain management: satisfactory to patient Vital Signs Assessment: post-procedure vital signs reviewed and stable Respiratory status: respiratory function stable Cardiovascular status: stable Postop Assessment: no headache, no backache, epidural receding, patient able to bend at knees, no signs of nausea or vomiting and adequate PO intake Anesthetic complications: no    Last Vitals:  Vitals:   01/09/19 0256 01/09/19 0605  BP: 123/79 120/78  Pulse: 66 61  Resp: 18 16  Temp: 36.7 C 36.8 C  SpO2: 99% 99%    Last Pain:  Vitals:   01/09/19 0605  TempSrc: Oral  PainSc: 4    Pain Goal: Patients Stated Pain Goal: 6 (01/07/19 1810)                 Katherina Mires

## 2019-01-09 NOTE — Lactation Note (Signed)
This note was copied from a baby's chart. Lactation Consultation Note  Patient Name: [name] [id number] Date: 01/09/2019 Reason for consult: Follow-up assessment   Twins 55 hours old.  Mother has chosen to exclusively formula feed.  Discussed cabbage leaves.    Maternal Data    Feeding Feeding Type: Formula Nipple Type: Extra Slow Flow  LATCH Score                   Interventions    Lactation Tools Discussed/Used     Consult Status Consult Status: Complete    Carlye Grippe 01/09/2019, 10:01 AM

## 2019-01-10 MED ORDER — ACETAMINOPHEN 325 MG PO TABS: 650.0000 mg | ORAL_TABLET | Freq: Four times a day (QID) | ORAL | 0 refills | Status: DC | PRN

## 2019-01-10 MED ORDER — IBUPROFEN 600 MG PO TABS: 600.0000 mg | ORAL_TABLET | Freq: Four times a day (QID) | ORAL | 0 refills | Status: DC | PRN

## 2019-01-10 MED ORDER — OXYCODONE HCL 5 MG PO TABS: 5.0000 mg | ORAL_TABLET | ORAL | 0 refills | Status: DC | PRN

## 2019-01-11 LAB — BPAM RBC
Blood Product Expiration Date: 202012092359
Blood Product Expiration Date: 202012172359
Blood Product Expiration Date: 202012192359
ISSUE DATE / TIME: 202011121332
Unit Type and Rh: 5100
Unit Type and Rh: 5100
Unit Type and Rh: 5100

## 2019-01-11 LAB — TYPE AND SCREEN
ABO/RH(D): O POS
Antibody Screen: NEGATIVE
Donor AG Type: NEGATIVE
Donor AG Type: NEGATIVE
Unit division: 0
Unit division: 0
Unit division: 0

## 2019-01-11 LAB — CERVICOVAGINAL ANCILLARY ONLY
Chlamydia: NEGATIVE
Comment: NEGATIVE
Comment: NORMAL
Neisseria Gonorrhea: NEGATIVE

## 2019-01-12 ENCOUNTER — Encounter: Payer: Self-pay | Admitting: *Deleted

## 2019-01-14 ENCOUNTER — Encounter: Payer: Self-pay | Admitting: Obstetrics and Gynecology

## 2019-01-14 ENCOUNTER — Ambulatory Visit (INDEPENDENT_AMBULATORY_CARE_PROVIDER_SITE_OTHER): Payer: Medicaid Other | Admitting: Obstetrics and Gynecology

## 2019-01-14 ENCOUNTER — Other Ambulatory Visit: Payer: Self-pay

## 2019-01-14 VITALS — BP 147/87 | HR 46 | Ht 64.0 in | Wt 176.6 lb

## 2019-01-14 DIAGNOSIS — Z9889 Other specified postprocedural states: Secondary | ICD-10-CM

## 2019-01-14 DIAGNOSIS — Z09 Encounter for follow-up examination after completed treatment for conditions other than malignant neoplasm: Secondary | ICD-10-CM

## 2019-01-14 NOTE — Progress Notes (Signed)
Patient ID: [name], female   DOB: Dec 11, 1985, 33 y.o.   MRN: [id number]  Subjective:  [name] is a 33 y.o. female now 1 weeks status post BTL incision check.    is bottle feeding but plans to start breast feeding today Review of Systems Negative except none   Diet:   normal   Bowel movements : normal.  Pain is controlled with current analgesics. Medications being used: narcotic analgesics including oxycodone.  Objective:  LMP 04/26/2018  General:Well developed, well nourished.  No acute distress. Abdomen: Bowel sounds normal, soft, non-tender, still abdominal puffiness after delivery Pelvic Exam: Not examined Incision(s): Healing well, no drainage, no erythema, no hernia, no swelling, no dehiscence,     Assessment:  Post-Op 1 weeks s/p BTL   Doing well postoperatively.   Plan:  1. current medications.oxycodone 2. Activity restrictions: none 3. Follow up in 4 weeks for PP  By signing my name below, I, Samul Dada, attest that this documentation has been prepared under the direction and in the presence of Jonnie Kind, MD. Electronically Signed: [place]. 01/14/19. 10:35 AM.  I personally performed the services described in this documentation, which was SCRIBED in my presence. The recorded information has been reviewed and considered accurate. It has been edited as necessary during review. Jonnie Kind, MD

## 2019-02-14 ENCOUNTER — Encounter: Payer: Self-pay | Admitting: Women's Health

## 2019-02-14 ENCOUNTER — Telehealth (INDEPENDENT_AMBULATORY_CARE_PROVIDER_SITE_OTHER): Payer: Medicaid Other | Admitting: Women's Health

## 2019-02-14 ENCOUNTER — Other Ambulatory Visit: Payer: Self-pay

## 2019-02-14 DIAGNOSIS — Z1389 Encounter for screening for other disorder: Secondary | ICD-10-CM

## 2019-02-14 DIAGNOSIS — Z8632 Personal history of gestational diabetes: Secondary | ICD-10-CM

## 2019-02-14 NOTE — Progress Notes (Signed)
TELEHEALTH VIRTUAL POSTPARTUM VISIT ENCOUNTER NOTE Patient name: [name] MRN [id number]  Date of birth: 10-01-1985  I connected with patient on 02/14/19 at  2:30 PM EST by phone (unable to get video to work) and verified that I am speaking with the correct person using two identifiers. Due to COVID-19 recommendations, pt is not currently in our office.    I discussed the limitations, risks, security and privacy concerns of performing an evaluation and management service by telephone and the availability of in person appointments. I also discussed with the patient that there may be a patient responsible charge related to this service. The patient expressed understanding and agreed to proceed.  Chief Complaint:   Postpartum Care  History of Present Illness:   [name] is a 33 y.o. [phone] African American female being evaluated today for a postpartum visit. She is 5 weeks postpartum following a spontaneous vaginal delivery at 36.5 gestational weeks d/t labor w/ twins. Anesthesia: epidural. Laceration: 1st degree. Had pp BTL. I have fully reviewed the prenatal and intrapartum course. Pregnancy complicated by A2DM, di-di twins. Postpartum course has been uncomplicated. Bleeding no bleeding. Bowel function is normal. Bladder function is normal.  Patient is sexually active. No problems Contraception method is tubal ligation.  Last pap 08/02/18.  Results were normal .  Patient's last menstrual period was 04/26/2018.  Baby's course has been uncomplicated. Baby is feeding by bottle   Edinburgh Postpartum Depression Screening: negative Edinburgh Postnatal Depression Scale - 02/14/19 1427      Edinburgh Postnatal Depression Scale:  In the Past 7 Days   I have been able to laugh and see the funny side of things.  0    I have looked forward with enjoyment to things.  0    I have blamed myself unnecessarily when things went wrong.  0    I have been anxious or worried for no good reason.  2     I have felt scared or panicky for no good reason.  0    Things have been getting on top of me.  0    I have been so unhappy that I have had difficulty sleeping.  0    I have felt sad or miserable.  0    I have been so unhappy that I have been crying.  0    The thought of harming myself has occurred to me.  0    Edinburgh Postnatal Depression Scale Total  2      Review of Systems:   Pertinent items are noted in HPI Denies Abnormal vaginal discharge w/ itching/odor/irritation, headaches, visual changes, shortness of breath, chest pain, abdominal pain, severe nausea/vomiting, or problems with urination or bowel movements. Pertinent History Reviewed:  Reviewed past medical,surgical, obstetrical and family history.  Reviewed problem list, medications and allergies. OB History  Gravida Para Term Preterm AB Living  6 5 4 1 1 6   SAB TAB Ectopic Multiple Live Births  1     1 6     # Outcome Date GA Lbr Len/2nd Weight Sex Delivery Anes PTL Lv  6A Preterm 01/08/19 [redacted]w[redacted]d 08:30 / 00:10 5 lb 11.2 oz (2.585 kg) F Vag-Spont EPI  LIV  6B Preterm 01/08/19 [redacted]w[redacted]d 08:30 / 00:18 5 lb 4.3 oz (2.39 kg) F Vag-Spont EPI  LIV  5 SAB 10/11/17 [redacted]w[redacted]d         4 Term 02/21/17 [redacted]w[redacted]d  7 lb 15.3 oz (3.609 kg) F Vag-Spont None  LIV  3 Term 12/02/08 [redacted]w[redacted]d  7 lb 10 oz (3.459 kg) M Vag-Spont None N LIV  2 Term 08/18/06 [redacted]w[redacted]d  7 lb 4 oz (3.289 kg) F Vag-Spont None Y LIV  1 Term 08/14/04 [redacted]w[redacted]d  7 lb 9 oz (3.43 kg) M Vag-Spont EPI N LIV   Physical Assessment:  There were no vitals filed for this visit.There is no height or weight on file to calculate BMI.       Physical Examination:  General:  Alert, oriented and cooperative.   Mental Status: Normal mood and affect perceived. Normal judgment and thought content.  Rest of physical exam deferred due to type of encounter       No results found for this or any previous visit (from the past 24 hour(s)).  Assessment & Plan:  1) Postpartum exam 2) 5 wks s/p SVB at 36.5wks  di-di twins d/t PTL 3) Bottlefeeding 4) Depression screening 5) S/P BTL 6) A2DM during pregnancy> pp 2hr GTT in 3wks  Meds: No orders of the defined types were placed in this encounter.   I discussed the assessment and treatment plan with the patient. The patient was provided an opportunity to ask questions and all were answered. The patient agreed with the plan and demonstrated an understanding of the instructions.   The patient was advised to call back or seek an in-person evaluation/go to the ED for any concerning postpartum symptoms.  I provided 15 minutes of non-face-to-face time during this encounter.  Follow-up: Return in about 3 weeks (around 03/07/2019) for 2hr pp GTT.   No orders of the defined types were placed in this encounter.   [place], [facility] 02/14/2019 2:51 PM

## 2019-02-14 NOTE — Patient Instructions (Signed)
You will have your sugar test next visit.  Please do not eat or drink anything after midnight the night before you come, not even water.  You will be here for at least two hours.  Please make an appointment online for the bloodwork at [email or web address] for 8:30am (or as close to this as possible). Make sure you select the [address] service center. The day of the appointment, check in with our office first, then you will go to [facility] to start the sugar test.   

## 2019-03-07 ENCOUNTER — Other Ambulatory Visit: Payer: Medicaid Other

## 2019-03-14 ENCOUNTER — Other Ambulatory Visit: Payer: Medicaid Other

## 2019-04-01 ENCOUNTER — Telehealth: Payer: Self-pay | Admitting: Obstetrics & Gynecology

## 2019-04-01 MED ORDER — TERCONAZOLE 0.4 % VA CREA: 1.0000 | TOPICAL_CREAM | Freq: Every day | VAGINAL | 0 refills | Status: DC

## 2019-04-25 ENCOUNTER — Ambulatory Visit: Payer: Medicaid Other | Attending: Internal Medicine

## 2019-04-25 ENCOUNTER — Other Ambulatory Visit: Payer: Self-pay

## 2019-04-25 DIAGNOSIS — Z20822 Contact with and (suspected) exposure to covid-19: Secondary | ICD-10-CM | POA: Diagnosis not present

## 2019-04-26 ENCOUNTER — Telehealth: Payer: Self-pay | Admitting: *Deleted

## 2019-04-26 ENCOUNTER — Telehealth: Payer: Self-pay

## 2019-04-26 NOTE — Telephone Encounter (Signed)
Pt. Checking on COVID 19 results. Not ready yet.

## 2019-04-26 NOTE — Telephone Encounter (Signed)
Pt advised that her covid 19 test result is not available at this time. She voiced understanding.

## 2019-04-27 LAB — NOVEL CORONAVIRUS, NAA: SARS-CoV-2, NAA: NOT DETECTED

## 2019-04-28 ENCOUNTER — Telehealth: Payer: Self-pay

## 2019-04-28 NOTE — Telephone Encounter (Signed)
Pt notified of negative COVID-19 results. Understanding verbalized.   

## 2019-04-28 NOTE — Telephone Encounter (Signed)
Attempted to call patient to discuss her COVID exposure and protocol for testing after exposure. Left message to return call on voice mail.

## 2019-08-02 ENCOUNTER — Other Ambulatory Visit: Payer: Medicaid Other

## 2019-08-02 ENCOUNTER — Other Ambulatory Visit: Payer: Self-pay

## 2019-08-02 ENCOUNTER — Ambulatory Visit: Payer: Medicaid Other | Attending: Internal Medicine

## 2019-08-02 DIAGNOSIS — Z20822 Contact with and (suspected) exposure to covid-19: Secondary | ICD-10-CM | POA: Diagnosis not present

## 2019-08-03 LAB — NOVEL CORONAVIRUS, NAA: SARS-CoV-2, NAA: NOT DETECTED

## 2019-08-03 LAB — SARS-COV-2, NAA 2 DAY TAT

## 2019-10-26 ENCOUNTER — Ambulatory Visit: Payer: Medicaid Other | Admitting: Adult Health

## 2019-10-26 ENCOUNTER — Ambulatory Visit (INDEPENDENT_AMBULATORY_CARE_PROVIDER_SITE_OTHER): Payer: Medicaid Other | Admitting: Adult Health

## 2019-10-26 ENCOUNTER — Encounter: Payer: Self-pay | Admitting: Adult Health

## 2019-10-26 VITALS — BP 115/74 | HR 76 | Ht 64.0 in | Wt 193.5 lb

## 2019-10-26 DIAGNOSIS — R1032 Left lower quadrant pain: Secondary | ICD-10-CM | POA: Diagnosis not present

## 2019-10-26 DIAGNOSIS — M545 Low back pain, unspecified: Secondary | ICD-10-CM | POA: Insufficient documentation

## 2019-10-26 DIAGNOSIS — K5900 Constipation, unspecified: Secondary | ICD-10-CM | POA: Diagnosis not present

## 2019-10-26 DIAGNOSIS — Z8719 Personal history of other diseases of the digestive system: Secondary | ICD-10-CM | POA: Diagnosis not present

## 2019-10-26 LAB — POCT URINALYSIS DIPSTICK
Blood, UA: NEGATIVE
Glucose, UA: NEGATIVE
Ketones, UA: NEGATIVE
Leukocytes, UA: NEGATIVE
Nitrite, UA: NEGATIVE
Protein, UA: NEGATIVE

## 2019-10-26 LAB — POCT URINE PREGNANCY: Preg Test, Ur: NEGATIVE

## 2019-10-26 MED ORDER — LINACLOTIDE 145 MCG PO CAPS: 145.0000 ug | ORAL_CAPSULE | Freq: Every day | ORAL | 1 refills | Status: DC

## 2019-10-26 NOTE — Progress Notes (Signed)
  Subjective:     Patient ID: [name], female   DOB: [birth year], 34 y.o.   MRN: [id number]  HPI [name] is a 34 year old black female, married, [id number], sp tubal in complaining of low back pain and pelvic/adomianl pain esp on the left,started after last period. PCP is [facility].   Review of Systems Pain low back,started after period, LMP 8/21 Pain in pelvic/abdomen esp Left side Denies any fever, nausea,vomiting or diarrhea +constipation, no BM in over a week   Reviewed past medical,surgical, social and family history. Reviewed medications and allergies.      Objective:   Physical Exam BP 115/74 (BP Location: Left Arm, Patient Position: Sitting, Cuff Size: Normal)   Pulse 76   Ht 5\' 4"  (1.626 m)   Wt 193 lb 8 oz (87.8 kg)   LMP 10/15/2019   Breastfeeding No   BMI 33.21 kg/m UPT is negative,urine dipstick is negative. Skin warm and dry. Abdomen, has BS in all 4 quardrants and  is soft, no HSM,+ tenderness mid epigastric to left mid area. Pelvic: external genitalia is normal in appearance no lesions, vagina:scant discharge without odor,urethra has no lesions or masses noted, cervix:smooth and bulbous,no CMT. uterus: normal size, shape and contour, non tender, no masses felt, adnexa: no masses, LLQ tenderness noted, no rebound. Bladder is non tender and no masses felt. No CVAT Fall risk is low   [facility] - 10/26/19 0925      Pregnancy Intention Screening   Does the patient want to become pregnant in the next year? N/A    Does the patient's partner want to become pregnant in the next year? N/A    Would the patient like to discuss contraceptive options today? N/A      Contraception Wrap Up   Current Method Female Sterilization    End Method Female Sterilization    Contraception Counseling Provided No         Examination chaperoned by 12/26/19 LPN    Assessment:     1. Acute bilateral low back pain without sciatica  2. LLQ pain I fell it is related to  constipation, will try linzess   3. Constipation, unspecified constipation type Will try Linzess  Meds ordered this encounter  Medications  . linaclotide (LINZESS) 145 MCG CAPS capsule    Sig: Take 1 capsule (145 mcg total) by mouth daily before breakfast.    Dispense:  30 capsule    Refill:  1    Order Specific Question:   Supervising Provider    Answer:   Malachy Mood, LUTHER H [[id number]]    4. History of IBS     Plan:     Follow up in 1 week, if not better will get CT or [facility]  If any fever or increased pain go to ER

## 2019-11-02 ENCOUNTER — Ambulatory Visit: Payer: Medicaid Other | Admitting: Adult Health

## 2019-12-13 ENCOUNTER — Other Ambulatory Visit: Payer: Medicaid Other

## 2019-12-13 ENCOUNTER — Other Ambulatory Visit: Payer: Self-pay

## 2019-12-13 DIAGNOSIS — Z20822 Contact with and (suspected) exposure to covid-19: Secondary | ICD-10-CM | POA: Diagnosis not present

## 2019-12-14 LAB — NOVEL CORONAVIRUS, NAA: SARS-CoV-2, NAA: NOT DETECTED

## 2019-12-14 LAB — SARS-COV-2, NAA 2 DAY TAT

## 2020-01-06 ENCOUNTER — Encounter: Payer: Self-pay | Admitting: Emergency Medicine

## 2020-01-06 ENCOUNTER — Other Ambulatory Visit: Payer: Self-pay

## 2020-01-06 ENCOUNTER — Ambulatory Visit
Admission: EM | Admit: 2020-01-06 | Discharge: 2020-01-06 | Disposition: A | Discharge status: Home / Self Care | Payer: Medicaid Other | Attending: Emergency Medicine | Admitting: Emergency Medicine

## 2020-01-06 DIAGNOSIS — B029 Zoster without complications: Secondary | ICD-10-CM | POA: Diagnosis not present

## 2020-01-06 MED ORDER — PREDNISONE 10 MG (21) PO TBPK: ORAL_TABLET | Freq: Every day | ORAL | 0 refills | Status: DC

## 2020-01-06 MED ORDER — VALACYCLOVIR HCL 1 G PO TABS: 1000.0000 mg | ORAL_TABLET | Freq: Three times a day (TID) | ORAL | 0 refills | Status: AC

## 2020-01-06 MED ORDER — DEXAMETHASONE SODIUM PHOSPHATE 10 MG/ML IJ SOLN
10.0000 mg | Freq: Once | INTRAMUSCULAR | Status: AC
  Administered 2020-01-06: 10 mg via INTRAMUSCULAR

## 2020-01-06 NOTE — Discharge Instructions (Addendum)
Rest and use ice/heat as needed for symptomatic relief Prescribed valacyclovir 1000mg 3x/day for 10 days Prescribed prednisone taper for inflammation and pain Use OTC medications such as ibuprofen/ tylenol.  Follow up with PCP in 7-10 days if rash is still present Follow up with PCP if symptoms of burning, stinging, tingling or numbness occur after rash resolves, you may need additional treatment Return here or go to ER if you have any new or worsening symptoms (such as eye involvement, severe pain, or signs of secondary infection such as fever, chills, nausea, vomiting, discharge, redness or warmth over site of rash) 

## 2020-01-06 NOTE — ED Provider Notes (Signed)
[facility]   [id number] 01/06/20 Arrival Time: 1314  CC: Rash  SUBJECTIVE:  [name] is a 34 y.o. female who presents with a rash x 2 weeks.  Denies precipitating event or trauma.  Localizes the rash to RT flank.  Describes it as painful, itchy, stinging and spreading.  Has tried OTC hydrocortisone and rubbing alcohol with minimal relief.  Symptoms are made worse with scratching.  Denies similar symptoms in the past.   Denies fever, chills, nausea, vomiting, swelling, discharge, oral lesions, SOB, chest pain, abdominal pain, changes in bowel or bladder function.    ROS: As per HPI.  All other pertinent ROS negative.     Past Medical History:  Diagnosis Date  . Anemia   . Blood transfusion without reported diagnosis   . Dislocated shoulder 03/31/2013   recurrant   . IBS (irritable bowel syndrome)    with constipation  . Shoulder dislocation, recurrent    Past Surgical History:  Procedure Laterality Date  . DILATION AND CURETTAGE OF UTERUS  10/11/2017   incomplete abortion  . DILATION AND EVACUATION N/A 10/11/2017   Procedure: suction dilation and evacuation;  Surgeon: Conan Bowens, MD;  Location: [facility] ORS;  Service: Gynecology;  Laterality: N/A;  . TUBAL LIGATION Bilateral 02/21/2017   pt decided to not have this done  . TUBAL LIGATION Bilateral 01/08/2019   Procedure: POST PARTUM TUBAL LIGATION;  Surgeon: L[place] Bing, MD;  Location: [facility] LD ORS;  Service: Gynecology;  Laterality: Bilateral;  . WISDOM TOOTH EXTRACTION     Allergies  Allergen Reactions  . Sulfa Antibiotics Rash   No current facility-administered medications on file prior to encounter.   Current Outpatient Medications on File Prior to Encounter  Medication Sig Dispense Refill  . ibuprofen (ADVIL) 600 MG tablet Take 1 tablet (600 mg total) by mouth every 6 (six) hours as needed for mild pain. (Patient taking differently: Take 500 mg by mouth every 6 (six) hours as needed for mild pain. ) 30  tablet 0  . linaclotide (LINZESS) 145 MCG CAPS capsule Take 1 capsule (145 mcg total) by mouth daily before breakfast. 30 capsule 1   Social History   Socioeconomic History  . Marital status: Married    Spouse name: Nayman  . Number of children: 4  . Years of education: 32  . Highest education level: 12th grade  Occupational History  . Not on file  Tobacco Use  . Smoking status: Former Smoker    Types: Cigarettes    Quit date: 07/22/2004    Years since quitting: 15.4  . Smokeless tobacco: Never Used  Vaping Use  . Vaping Use: Never used  Substance and Sexual Activity  . Alcohol use: No    Comment: none since +UPT  . Drug use: No  . Sexual activity: Yes    Birth control/protection: Surgical    Comment: tubal  Other Topics Concern  . Not on file  Social History Narrative  . Not on file   Social Determinants of Health   Financial Resource Strain:   . Difficulty of Paying Living Expenses: Not on file  Food Insecurity:   . Worried About Programme researcher, broadcasting/film/video in the Last Year: Not on file  . Ran Out of Food in the Last Year: Not on file  Transportation Needs:   . Lack of Transportation (Medical): Not on file  . Lack of Transportation (Non-Medical): Not on file  Physical Activity:   . Days of Exercise per  Week: Not on file  . Minutes of Exercise per Session: Not on file  Stress:   . Feeling of Stress : Not on file  Social Connections:   . Frequency of Communication with Friends and Family: Not on file  . Frequency of Social Gatherings with Friends and Family: Not on file  . Attends Religious Services: Not on file  . Active Member of Clubs or Organizations: Not on file  . Attends Banker Meetings: Not on file  . Marital Status: Not on file  Intimate Partner Violence:   . Fear of Current or Ex-Partner: Not on file  . Emotionally Abused: Not on file  . Physically Abused: Not on file  . Sexually Abused: Not on file   Family History  Problem Relation Age of  Onset  . Diabetes Sister   . Cancer Paternal Grandmother        breast  . Asthma Son   . Diabetes Maternal Aunt   . Cancer Paternal Aunt        cervical, breast  . Colon cancer Neg Hx     OBJECTIVE: Vitals:   01/06/20 1320 01/06/20 1323  BP: 129/79   Pulse: 92   Resp: 19   Temp: 98.2 F (36.8 C)   TempSrc: Oral   SpO2: 97%   Weight:  175 lb (79.4 kg)  Height:  5\' 4"  (1.626 m)    General appearance: alert; no distress Head: NCAT Lungs: normal respiratory effort Extremities: no edema Skin: warm and dry; crops of red/purplish papules over t10 dermatome on the RT flank; some crusting Psychological: alert and cooperative; normal mood and affect  ASSESSMENT & PLAN:  1. Herpes zoster without complication     Meds ordered this encounter  Medications  . predniSONE (STERAPRED UNI-PAK 21 TAB) 10 MG (21) TBPK tablet    Sig: Take by mouth daily. Take 6 tabs by mouth daily  for 2 days, then 5 tabs for 2 days, then 4 tabs for 2 days, then 3 tabs for 2 days, 2 tabs for 2 days, then 1 tab by mouth daily for 2 days    Dispense:  42 tablet    Refill:  0    Order Specific Question:   Supervising Provider    Answer:   Eustace Moore  . valACYclovir (VALTREX) 1000 MG tablet    Sig: Take 1 tablet (1,000 mg total) by mouth 3 (three) times daily for 10 days.    Dispense:  30 tablet    Refill:  0    Order Specific Question:   Supervising Provider    Answer:   [[id number]] Eustace Moore  . dexamethasone (DECADRON) injection 10 mg    Rest and use ice/heat as needed for symptomatic relief Prescribed valacyclovir 1000mg  3x/day for 10 days Prescribed prednisone taper for inflammation and pain Use OTC medications such as ibuprofen/ tylenol.   Follow up with PCP in 7-10 days if rash is still present Follow up with PCP if symptoms of burning, stinging, tingling or numbness occur after rash resolves, you may need additional treatment Return here or go to ER if you have any new  or worsening symptoms (such as eye involvement, severe pain, or signs of secondary infection such as fever, chills, nausea, vomiting, discharge, redness or warmth over site of rash)  Reviewed expectations re: course of current medical issues. Questions answered. Outlined signs and symptoms indicating need for more acute intervention. Patient verbalized understanding. After Visit Summary given.  Rennis Harding, PA-C 01/06/20 1351

## 2020-01-06 NOTE — ED Triage Notes (Addendum)
Rash on LT flank that is itchy, tingling and burns x 2 weeks.  Pt has been putting alcohol and cortisone cream on area with no relief.

## 2020-03-08 ENCOUNTER — Encounter: Payer: Self-pay | Admitting: Adult Health

## 2020-03-08 ENCOUNTER — Ambulatory Visit (INDEPENDENT_AMBULATORY_CARE_PROVIDER_SITE_OTHER): Payer: Medicaid Other | Admitting: Adult Health

## 2020-03-08 ENCOUNTER — Other Ambulatory Visit: Payer: Self-pay

## 2020-03-08 ENCOUNTER — Other Ambulatory Visit (HOSPITAL_COMMUNITY)
Admission: RE | Admit: 2020-03-08 | Discharge: 2020-03-08 | Disposition: A | Discharge status: Home / Self Care | Payer: Medicaid Other | Source: Ambulatory Visit | Attending: Adult Health | Admitting: Adult Health

## 2020-03-08 VITALS — BP 138/68 | HR 86 | Ht 64.0 in | Wt 184.6 lb

## 2020-03-08 DIAGNOSIS — B9689 Other specified bacterial agents as the cause of diseases classified elsewhere: Secondary | ICD-10-CM

## 2020-03-08 DIAGNOSIS — N760 Acute vaginitis: Secondary | ICD-10-CM | POA: Diagnosis not present

## 2020-03-08 DIAGNOSIS — R102 Pelvic and perineal pain: Secondary | ICD-10-CM | POA: Insufficient documentation

## 2020-03-08 DIAGNOSIS — N898 Other specified noninflammatory disorders of vagina: Secondary | ICD-10-CM | POA: Insufficient documentation

## 2020-03-08 DIAGNOSIS — K5900 Constipation, unspecified: Secondary | ICD-10-CM

## 2020-03-08 LAB — POCT WET PREP (WET MOUNT)
Clue Cells Wet Prep Whiff POC: POSITIVE
WBC, Wet Prep HPF POC: POSITIVE

## 2020-03-08 MED ORDER — METRONIDAZOLE 500 MG PO TABS: 500.0000 mg | ORAL_TABLET | Freq: Two times a day (BID) | ORAL | 0 refills | Status: DC

## 2020-03-08 MED ORDER — LINACLOTIDE 145 MCG PO CAPS: 145.0000 ug | ORAL_CAPSULE | Freq: Every day | ORAL | 6 refills | Status: DC

## 2020-03-08 NOTE — Progress Notes (Signed)
  Subjective:     Patient ID: [name], female   DOB: 16-Nov-1985, 35 y.o.   MRN: [id number]  HPI [name] is a 35 year old black female, married, [id number] in complaining of vaginal discharge for about 2 weeks and vaginal odor for 2 days, and has low pelvic to back pain at times, and has constipation.   Review of Systems Has vaginal odor for 2 days Has vaginal discharge for about 2 weeks Has low pelvic to back pain  +constipation, linzess did help Reviewed past medical,surgical, social and family history. Reviewed medications and allergies.     Objective:   Physical Exam BP 138/68 (BP Location: Right Arm, Patient Position: Sitting, Cuff Size: Normal)   Pulse 86   Ht 5\' 4"  (1.626 m)   Wt 184 lb 9.6 oz (83.7 kg)   LMP 02/27/2020 (Approximate)   BMI 31.69 kg/m  Skin warm and dry.Pelvic: external genitalia is normal in appearance no lesions, vagina: white discharge with odor,urethra has no lesions or masses noted, cervix:smooth and bulbous, uterus: normal size, shape and contour, non tender, no masses felt, adnexa: no masses,some tenderness noted, esp left side over bowel. Bladder is non tender and no masses felt. Wet prep: + for clue cells and +WBCs. CV swab obtained.     Upstream - 03/08/20 1410      Pregnancy Intention Screening   Does the patient want to become pregnant in the next year? No    Does the patient's partner want to become pregnant in the next year? No    Would the patient like to discuss contraceptive options today? No      Contraception Wrap Up   Current Method Female Sterilization    End Method Female Sterilization    Contraception Counseling Provided No         Examination chaperoned by 03/10/20  Assessment:     1. Vaginal odor CV swab sent   2. Vaginal discharge CV swab sent  3. Tenderness of female pelvic organs   4. BV (bacterial vaginosis) Will rx flagyl, no sex or alcohol during treatment   Meds ordered this encounter  Medications  .  linaclotide (LINZESS) 145 MCG CAPS capsule    Sig: Take 1 capsule (145 mcg total) by mouth daily before breakfast.    Dispense:  30 capsule    Refill:  6    Order Specific Question:   Supervising Provider    Answer:   Psychologist, prison and probation services, LUTHER H [[id number]]  . metroNIDAZOLE (FLAGYL) 500 MG tablet    Sig: Take 1 tablet (500 mg total) by mouth 2 (two) times daily.    Dispense:  14 tablet    Refill:  0    Order Specific Question:   Supervising Provider    Answer:   Despina Hidden, LUTHER H [[id number]]    5. Constipation, unspecified constipation type Will refill linzess    Plan:     Return in 6 weeks for physical and ROS, may need Despina Hidden

## 2020-03-12 LAB — CERVICOVAGINAL ANCILLARY ONLY
Bacterial Vaginitis (gardnerella): POSITIVE — AB
Candida Glabrata: NEGATIVE
Candida Vaginitis: NEGATIVE
Chlamydia: NEGATIVE
Comment: NEGATIVE
Comment: NEGATIVE
Comment: NEGATIVE
Comment: NEGATIVE
Comment: NEGATIVE
Comment: NORMAL
Neisseria Gonorrhea: NEGATIVE
Trichomonas: NEGATIVE

## 2020-04-03 ENCOUNTER — Other Ambulatory Visit: Payer: Self-pay | Admitting: Adult Health

## 2020-04-03 MED ORDER — METRONIDAZOLE 500 MG PO TABS: 500.0000 mg | ORAL_TABLET | Freq: Two times a day (BID) | ORAL | 2 refills | Status: DC

## 2020-04-03 NOTE — Progress Notes (Signed)
Refill flagyl  

## 2020-04-19 ENCOUNTER — Other Ambulatory Visit: Payer: Medicaid Other | Admitting: Adult Health

## 2020-05-24 ENCOUNTER — Other Ambulatory Visit (INDEPENDENT_AMBULATORY_CARE_PROVIDER_SITE_OTHER): Payer: Medicaid Other | Admitting: *Deleted

## 2020-05-24 DIAGNOSIS — R350 Frequency of micturition: Secondary | ICD-10-CM

## 2020-05-24 DIAGNOSIS — R309 Painful micturition, unspecified: Secondary | ICD-10-CM | POA: Diagnosis not present

## 2020-05-24 LAB — POCT URINALYSIS DIPSTICK
Glucose, UA: NEGATIVE
Ketones, UA: NEGATIVE
Nitrite, UA: POSITIVE
Protein, UA: POSITIVE — AB

## 2020-05-24 MED ORDER — NITROFURANTOIN MONOHYD MACRO 100 MG PO CAPS: 100.0000 mg | ORAL_CAPSULE | Freq: Two times a day (BID) | ORAL | 0 refills | Status: DC

## 2020-05-24 NOTE — Progress Notes (Addendum)
   NURSE VISIT- UTI SYMPTOMS   SUBJECTIVE:  [name] is a 35 y.o. [id number] female here for UTI symptoms. She is a GYN patient. She reports pain with urination and urinary frequency.  OBJECTIVE:  There were no vitals taken for this visit.  Appears well, in no apparent distress  Results for orders placed or performed in visit on 05/24/20 (from the past 24 hour(s))  POCT Urinalysis Dipstick   Collection Time: 05/24/20  9:49 AM  Result Value Ref Range   Color, UA     Clarity, UA     Glucose, UA Negative Negative   Bilirubin, UA     Ketones, UA neg    Spec Grav, UA     Blood, UA 3+    pH, UA     Protein, UA Positive (A) Negative   Urobilinogen, UA     Nitrite, UA positive    Leukocytes, UA Moderate (2+) (A) Negative   Appearance     Odor      ASSESSMENT: GYN patient with UTI symptoms and positive nitrites  PLAN: Discussed with [name], CNM, [facility]   Rx sent by provider today: Yes Urine culture sent Call or return to clinic prn if these symptoms worsen or fail to improve as anticipated. Follow-up: as needed   Malachy Mood  05/24/2020 9:54 AM   Chart reviewed for nurse visit. Agree with plan of care. Rx macrobid Cheral Marker, [place] 05/24/2020 9:57 AM

## 2020-05-24 NOTE — Addendum Note (Signed)
Addended by: Cheral Marker on: 05/24/2020 09:57 AM   Modules accepted: Orders

## 2020-05-28 LAB — URINE CULTURE

## 2020-05-30 ENCOUNTER — Other Ambulatory Visit (HOSPITAL_COMMUNITY)
Admission: RE | Admit: 2020-05-30 | Discharge: 2020-05-30 | Disposition: A | Discharge status: Home / Self Care | Payer: Medicaid Other | Source: Ambulatory Visit | Attending: Obstetrics & Gynecology | Admitting: Obstetrics & Gynecology

## 2020-05-30 ENCOUNTER — Other Ambulatory Visit (INDEPENDENT_AMBULATORY_CARE_PROVIDER_SITE_OTHER): Payer: Medicaid Other

## 2020-05-30 ENCOUNTER — Other Ambulatory Visit: Payer: Self-pay

## 2020-05-30 DIAGNOSIS — Z202 Contact with and (suspected) exposure to infections with a predominantly sexual mode of transmission: Secondary | ICD-10-CM | POA: Insufficient documentation

## 2020-05-30 NOTE — Progress Notes (Signed)
Chart reviewed for nurse visit. Agree with plan of care.  Tapanga Ottaway A, NP 05/30/2020 1:38 PM  

## 2020-05-30 NOTE — Progress Notes (Signed)
   NURSE VISIT- VAGINITIS/STD/POC  SUBJECTIVE:  [name] is a 35 y.o. [id number] GYN patientfemale here for a vaginal swab for vaginitis screening, STD screen.  She reports the following symptoms: none for N/A Denies abnormal vaginal bleeding, significant pelvic pain, fever, or UTI symptoms.  OBJECTIVE:  There were no vitals taken for this visit.  Appears well, in no apparent distress  ASSESSMENT: Vaginal swab for Vaginitis screening & STD screening  PLAN: Self-collected vaginal probe for Gonorrhea, Chlamydia, Trichomonas, Bacterial Vaginosis, Yeast sent to lab Treatment: to be determined once results are received Follow-up as needed if symptoms persist/worsen, or new symptoms develop  Donne Baley A Oleg Oleson  05/30/2020 11:03 AM

## 2020-05-31 ENCOUNTER — Other Ambulatory Visit: Payer: Self-pay | Admitting: Adult Health

## 2020-05-31 LAB — CERVICOVAGINAL ANCILLARY ONLY
Bacterial Vaginitis (gardnerella): POSITIVE — AB
Candida Glabrata: NEGATIVE
Candida Vaginitis: NEGATIVE
Chlamydia: NEGATIVE
Comment: NEGATIVE
Comment: NEGATIVE
Comment: NEGATIVE
Comment: NEGATIVE
Comment: NEGATIVE
Comment: NORMAL
Neisseria Gonorrhea: NEGATIVE
Trichomonas: NEGATIVE

## 2020-05-31 MED ORDER — METRONIDAZOLE 500 MG PO TABS: 500.0000 mg | ORAL_TABLET | Freq: Two times a day (BID) | ORAL | 0 refills | Status: DC

## 2020-05-31 NOTE — Progress Notes (Signed)
Vaginal swab +BV, rx flagyl

## 2020-06-19 ENCOUNTER — Telehealth: Payer: Self-pay | Admitting: *Deleted

## 2020-06-19 NOTE — Telephone Encounter (Signed)
Patient states she is still having the same symptoms from before despite taking the Flagyl.  Can she be treated with another prescription or does she need to be seen? Please advise.

## 2020-06-19 NOTE — Telephone Encounter (Signed)
Pt to make appt for self swab, before prescribing any meds

## 2020-06-20 ENCOUNTER — Other Ambulatory Visit (HOSPITAL_COMMUNITY)
Admission: RE | Admit: 2020-06-20 | Discharge: 2020-06-20 | Disposition: A | Discharge status: Home / Self Care | Payer: Medicaid Other | Source: Ambulatory Visit | Attending: Obstetrics & Gynecology | Admitting: Obstetrics & Gynecology

## 2020-06-20 ENCOUNTER — Other Ambulatory Visit: Payer: Self-pay

## 2020-06-20 ENCOUNTER — Other Ambulatory Visit (INDEPENDENT_AMBULATORY_CARE_PROVIDER_SITE_OTHER): Payer: Medicaid Other

## 2020-06-20 DIAGNOSIS — Z113 Encounter for screening for infections with a predominantly sexual mode of transmission: Secondary | ICD-10-CM | POA: Insufficient documentation

## 2020-06-20 DIAGNOSIS — N898 Other specified noninflammatory disorders of vagina: Secondary | ICD-10-CM

## 2020-06-20 NOTE — Progress Notes (Addendum)
   NURSE VISIT- VAGINITIS/STD/POC  SUBJECTIVE:  [name] is a 35 y.o. [id number] GYN patientfemale here for a vaginal swab for vaginitis screening, STD screen.  She reports the following symptoms: discharge described as clear and vulvar erythema noted, local irritation and vulvar itching for 1 month. Denies abnormal vaginal bleeding, significant pelvic pain, fever, or UTI symptoms.  OBJECTIVE:  There were no vitals taken for this visit.  Appears well, in no apparent distress  ASSESSMENT: Vaginal swab for vaginitis & STD screening  PLAN: Self-collected vaginal probe for Gonorrhea, Chlamydia, Trichomonas, Bacterial Vaginosis, Yeast sent to lab Treatment: to be determined once results are received Follow-up as needed if symptoms persist/worsen, or new symptoms develop  Bohdan Macho A Bernhard Koskinen  06/20/2020 11:31 AM   Chart reviewed for nurse visit. Agree with plan of care.  Arabella Merles, CNM 06/20/2020 5:35 PM

## 2020-06-21 LAB — CERVICOVAGINAL ANCILLARY ONLY
Bacterial Vaginitis (gardnerella): POSITIVE — AB
Candida Glabrata: NEGATIVE
Candida Vaginitis: NEGATIVE
Chlamydia: NEGATIVE
Comment: NEGATIVE
Comment: NEGATIVE
Comment: NEGATIVE
Comment: NEGATIVE
Comment: NEGATIVE
Comment: NORMAL
Neisseria Gonorrhea: NEGATIVE
Trichomonas: NEGATIVE

## 2020-06-22 ENCOUNTER — Other Ambulatory Visit: Payer: Self-pay | Admitting: Adult Health

## 2020-06-22 MED ORDER — TINIDAZOLE 500 MG PO TABS: ORAL_TABLET | ORAL | 1 refills | Status: DC

## 2020-06-22 NOTE — Progress Notes (Signed)
rx tindamax +BV

## 2020-09-02 ENCOUNTER — Emergency Department (HOSPITAL_COMMUNITY): Payer: Medicaid Other

## 2020-09-02 ENCOUNTER — Emergency Department (HOSPITAL_COMMUNITY)
Admission: EM | Admit: 2020-09-02 | Discharge: 2020-09-02 | Disposition: A | Discharge status: Home / Self Care | Payer: Medicaid Other | Attending: Emergency Medicine | Admitting: Emergency Medicine

## 2020-09-02 ENCOUNTER — Encounter (HOSPITAL_COMMUNITY): Payer: Self-pay

## 2020-09-02 ENCOUNTER — Other Ambulatory Visit: Payer: Self-pay

## 2020-09-02 DIAGNOSIS — S43014A Anterior dislocation of right humerus, initial encounter: Secondary | ICD-10-CM | POA: Insufficient documentation

## 2020-09-02 DIAGNOSIS — N898 Other specified noninflammatory disorders of vagina: Secondary | ICD-10-CM | POA: Diagnosis not present

## 2020-09-02 DIAGNOSIS — Z87891 Personal history of nicotine dependence: Secondary | ICD-10-CM | POA: Insufficient documentation

## 2020-09-02 DIAGNOSIS — X501XXA Overexertion from prolonged static or awkward postures, initial encounter: Secondary | ICD-10-CM | POA: Insufficient documentation

## 2020-09-02 DIAGNOSIS — S4991XA Unspecified injury of right shoulder and upper arm, initial encounter: Secondary | ICD-10-CM | POA: Diagnosis present

## 2020-09-02 DIAGNOSIS — S43004A Unspecified dislocation of right shoulder joint, initial encounter: Secondary | ICD-10-CM

## 2020-09-02 DIAGNOSIS — M24411 Recurrent dislocation, right shoulder: Secondary | ICD-10-CM | POA: Diagnosis not present

## 2020-09-02 LAB — I-STAT CHEM 8, ED
BUN: 13 mg/dL (ref 6–20)
Calcium, Ion: 1.2 mmol/L (ref 1.15–1.40)
Chloride: 105 mmol/L (ref 98–111)
Creatinine, Ser: 0.7 mg/dL (ref 0.44–1.00)
Glucose, Bld: 115 mg/dL — ABNORMAL HIGH (ref 70–99)
HCT: 32 % — ABNORMAL LOW (ref 36.0–46.0)
Hemoglobin: 10.9 g/dL — ABNORMAL LOW (ref 12.0–15.0)
Potassium: 3.2 mmol/L — ABNORMAL LOW (ref 3.5–5.1)
Sodium: 143 mmol/L (ref 135–145)
TCO2: 24 mmol/L (ref 22–32)

## 2020-09-02 LAB — I-STAT BETA HCG BLOOD, ED (MC, WL, AP ONLY): I-stat hCG, quantitative: <5 m[IU]/mL (ref <5)

## 2020-09-02 MED ORDER — HYDROMORPHONE HCL 1 MG/ML IJ SOLN
1.0000 mg | Freq: Once | INTRAMUSCULAR | Status: AC
  Administered 2020-09-02: 1 mg via INTRAVENOUS
  Filled 2020-09-02: qty 1

## 2020-09-02 MED ORDER — ONDANSETRON HCL 4 MG/2ML IJ SOLN
4.0000 mg | Freq: Once | INTRAMUSCULAR | Status: AC
  Administered 2020-09-02: 4 mg via INTRAVENOUS
  Filled 2020-09-02: qty 2

## 2020-09-02 MED ORDER — MIDAZOLAM HCL 2 MG/2ML IJ SOLN: 2.0000 mg | Freq: Once | INTRAMUSCULAR | Status: DC

## 2020-09-02 NOTE — ED Provider Notes (Signed)
Reduction of dislocation  Date/Time: 09/02/2020 3:50 PM Performed by: Carroll Sage, PA-C Authorized by: Carroll Sage, PA-C  Consent: Verbal consent obtained. Risks and benefits: risks, benefits and alternatives were discussed Consent given by: patient Patient understanding: patient does not state understanding of the procedure being performed Patient identity confirmed: verbally with patient Local anesthesia used: no  Anesthesia: Local anesthesia used: no  Sedation: Patient sedated: no  Patient tolerance: patient tolerated the procedure well with no immediate complications Comments: Was able to reduce right shoulder without sedation.  Patient tolerated procedure well, neurovascularly intact after procedure.  Patient was placed in a sling and x-ray was ordered for reevaluation.      Carroll Sage, PA-C 09/02/20 1552    Vanetta Mulders, MD 09/04/20 [phone]

## 2020-09-02 NOTE — Discharge Instructions (Addendum)
Keep the sling on at all times even while sleeping.  Can remove it to bathe but keep your arm against her chest.  Make an appointment to follow-up with orthopedics Dr. Mort Sawyers information provided above.

## 2020-09-02 NOTE — ED Triage Notes (Signed)
Pt presents to ED with complaints of right shoulder pain. States she felt her shoulder slipped out when she went to grab something from her daughter and she was unable to get it back in this time.

## 2020-09-02 NOTE — ED Provider Notes (Signed)
[facility] EMERGENCY DEPARTMENT Provider Note   CSN: [id number] Arrival date & time: 09/02/20  1218     History Chief Complaint  Patient presents with   Shoulder Injury    [name] is a 35 y.o. female.  Patient with a longstanding history of frequent right shoulder dislocations.  At 1 point was plugged in for repair.  But patient lost insurance.  Used to be followed by Dr. Hilda Lias.  Patient was reaching to grab her daughter and the shoulder came out.  Normally she is able to get it back in but not able to get back in today.  No other injuries.  Patient with a little bit of tingling sensation in the fingers of the right hand.  The pain does radiate from the shoulder down into the forearm.      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/31/2013   recurrant    IBS (irritable bowel syndrome)    with constipation   Shoulder dislocation, recurrent     Patient Active Problem List   Diagnosis Date Noted   Tenderness of female pelvic organs 03/08/2020   Vaginal discharge 03/08/2020   Vaginal odor 03/08/2020   History of IBS 10/26/2019   Constipation 10/26/2019   LLQ pain 10/26/2019   Acute bilateral low back pain without sciatica 10/26/2019   History of gestational diabetes 11/02/2018   IBS (irritable bowel syndrome) 10/25/2015   BV (bacterial vaginosis) 01/18/2014    Past Surgical History:  Procedure Laterality Date   DILATION AND CURETTAGE OF UTERUS  10/11/2017   incomplete abortion   DILATION AND EVACUATION N/A 10/11/2017   Procedure: suction dilation and evacuation;  Surgeon: Conan Bowens, MD;  Location: [facility] ORS;  Service: Gynecology;  Laterality: N/A;   TUBAL LIGATION Bilateral 02/21/2017   pt decided to not have this done   TUBAL LIGATION Bilateral 01/08/2019   Procedure: POST PARTUM TUBAL LIGATION;  Surgeon: Cove Bing, MD;  Location: [facility] LD ORS;  Service: Gynecology;   Laterality: Bilateral;   WISDOM TOOTH EXTRACTION       OB History     Gravida  6   Para  5   Term  4   Preterm  1   AB  1   Living  6      SAB  1   IAB      Ectopic      Multiple  1   Live Births  6           Family History  Problem Relation Age of Onset   Diabetes Sister    Cancer Paternal Grandmother        breast   Asthma Son    Diabetes Maternal Aunt    Cancer Paternal Aunt        cervical, breast   Colon cancer Neg Hx     Social History   Tobacco Use   Smoking status: Former    Pack years: 0.00    Types: Cigarettes    Quit date: 07/22/2004    Years since quitting: 16.1   Smokeless tobacco: Never  Vaping Use   Vaping Use: Never used  Substance Use Topics   Alcohol use: No    Comment: none since +UPT   Drug use: No    Home Medications Prior to Admission medications   Medication Sig Start Date End Date Taking? Authorizing Provider  ibuprofen (  ADVIL) 200 MG tablet Take 800 mg by mouth as needed.    [provider]  tinidazole (TINDAMAX) 500 MG tablet Take 4 today and 4 in am 06/22/20   Adline Potter, NP    Allergies    Sulfa antibiotics  Review of Systems   Review of Systems  Constitutional:  Negative for chills and fever.  HENT:  Negative for ear pain and sore throat.   Eyes:  Negative for pain and visual disturbance.  Respiratory:  Negative for cough and shortness of breath.   Cardiovascular:  Negative for chest pain and palpitations.  Gastrointestinal:  Negative for abdominal pain and vomiting.  Genitourinary:  Negative for dysuria and hematuria.  Musculoskeletal:  Negative for arthralgias and back pain.  Skin:  Negative for color change and rash.  Neurological:  Positive for numbness. Negative for seizures and syncope.  All other systems reviewed and are negative.  Physical Exam Updated Vital Signs BP 117/78 (BP Location: Left Arm)   Pulse 73   Temp 98.9 F (37.2 C) (Oral)   Resp 18   Ht 1.626 m (5\' 4" )    Wt 79.4 kg   LMP 08/28/2020   SpO2 100%   BMI 30.04 kg/m   Physical Exam Vitals and nursing note reviewed.  Constitutional:      General: She is not in acute distress.    Appearance: Normal appearance. She is well-developed.  HENT:     Head: Normocephalic and atraumatic.  Eyes:     Extraocular Movements: Extraocular movements intact.     Conjunctiva/sclera: Conjunctivae normal.     Pupils: Pupils are equal, round, and reactive to light.  Cardiovascular:     Rate and Rhythm: Normal rate and regular rhythm.     Heart sounds: No murmur heard. Pulmonary:     Effort: Pulmonary effort is normal. No respiratory distress.     Breath sounds: Normal breath sounds.  Abdominal:     Palpations: Abdomen is soft.     Tenderness: There is no abdominal tenderness.  Musculoskeletal:        General: Deformity present.     Cervical back: Normal range of motion and neck supple.     Comments: Deformity to the right shoulder.  Radial pulse distally 2+.  Good movement of fingers wrist and elbow.  Some subjective numbness to the fingers.  Skin:    General: Skin is warm and dry.  Neurological:     General: No focal deficit present.     Mental Status: She is alert and oriented to person, place, and time.    ED Results / Procedures / Treatments   Labs (all labs ordered are listed, but only abnormal results are displayed) Labs Reviewed  I-STAT CHEM 8, ED - Abnormal; Notable for the following components:      Result Value   Potassium 3.2 (*)    Glucose, Bld 115 (*)    Hemoglobin 10.9 (*)    HCT 32.0 (*)    All other components within normal limits  I-STAT BETA HCG BLOOD, ED ([facility], [facility], [facility] ONLY)    EKG None  Radiology DG Shoulder Right  Result Date: 09/02/2020 CLINICAL DATA:  Post reduction. EXAM: RIGHT SHOULDER - 2+ VIEW COMPARISON:  Earlier same day. FINDINGS: Reduction of patient's previously seen anterior shoulder dislocation with normal alignment at the glenohumeral joint. 4 mm  well-defined ossific density adjacent the greater tuberosity unchanged. No definite fracture. IMPRESSION: Normal alignment of the glenohumeral joint post reduction of previously  seen anterior shoulder dislocation. No definite fracture. Electronically Signed   By: Elberta Fortis M.D.   On: 09/02/2020 15:30   DG Shoulder Right  Result Date: 09/02/2020 CLINICAL DATA:  Previous right shoulder dislocation possible re-injury. EXAM: RIGHT SHOULDER - 2+ VIEW COMPARISON:  06/15/2015 FINDINGS: Exam demonstrates evidence of recurrent anterior shoulder dislocation. No definite fracture. Well corticated 4 mm ossific density just superior to the greater tuberosity. IMPRESSION: 1. Recurrent anterior shoulder dislocation. No definite fracture. 2. 4 mm well corticated ossific density just superior to the greater tuberosity which may be sequelae from previous injury versus calcific tendinitis. Electronically Signed   By: Elberta Fortis M.D.   On: 09/02/2020 14:15    Procedures Procedures   Medications Ordered in ED Medications  midazolam (VERSED) injection 2 mg (has no administration in time range)  HYDROmorphone (DILAUDID) injection 1 mg (1 mg Intravenous Given 09/02/20 1301)  ondansetron (ZOFRAN) injection 4 mg (4 mg Intravenous Given 09/02/20 1259)    ED Course  I have reviewed the triage vital signs and the nursing notes.  Pertinent labs & imaging results that were available during my care of the patient were reviewed by me and considered in my medical decision making (see chart for details).    MDM Rules/Calculators/A&P                          Patient clinically with a dislocation of the right shoulder.  Patient not able to get it back and this is happened multiple times.  We will going get x-ray.  But prior to x-ray patient given some IV Dilaudid and some IV Versed.  Will reassess after x-rays.  X-ray confirms anterior shoulder dislocation.  Physician assistant Uvaldo Rising was able to reduce the shoulder  without conscious sedation.  Which is to be expected since it has been out many times in the past.  Follow-up x-ray confirms that the shoulder is back in place.  Patient is in a sling and swath.  We will have patient follow-up with Dr. Romeo Apple from orthopedics.  I provided a substantive portion of the care of this patient.  I personally performed the entirety of the history, exam, and medical decision making for this encounter.  CRITICAL CARE Performed by: Vanetta Mulders Total critical care time: 35 minutes Critical care time was exclusive of separately billable procedures and treating other patients. Critical care was necessary to treat or prevent imminent or life-threatening deterioration. Critical care was time spent personally by me on the following activities: development of treatment plan with patient and/or surrogate as well as nursing, discussions with consultants, evaluation of patient's response to treatment, examination of patient, obtaining history from patient or surrogate, ordering and performing treatments and interventions, ordering and review of laboratory studies, ordering and review of radiographic studies, pulse oximetry and re-evaluation of patient's condition.   Post reduction patient with good radial pulse.  Sensation intact.  Good movement of fingers.     Final Clinical Impression(s) / ED Diagnoses Final diagnoses:  Dislocation of right shoulder joint, initial encounter    Rx / DC Orders ED Discharge Orders     None        Vanetta Mulders, MD 09/02/20 1540

## 2020-09-03 ENCOUNTER — Telehealth: Payer: Self-pay

## 2020-09-03 DIAGNOSIS — Z9189 Other specified personal risk factors, not elsewhere classified: Secondary | ICD-10-CM

## 2020-09-03 NOTE — Telephone Encounter (Signed)
Transition Care Management Follow-up Telephone Call Date of discharge and from where: 09/02/2020- [facility] How have you been since you were released from the hospital? Feeling Better. Just sore and a little weak. Any questions or concerns? No  Items Reviewed: Did the pt receive and understand the discharge instructions provided? Yes  Medications obtained and verified? Yes  Other? No  Any new allergies since your discharge? No  Dietary orders reviewed? No Do you have support at home? Yes   Home Care and Equipment/Supplies: Were home health services ordered? not applicable If so, what is the name of the agency? N/A  Has the agency set up a time to come to the patient's home? not applicable Were any new equipment or medical supplies ordered?  No What is the name of the medical supply agency? N/A Were you able to get the supplies/equipment? not applicable Do you have any questions related to the use of the equipment or supplies? No  Functional Questionnaire: (I = Independent and D = Dependent) ADLs: I  Bathing/Dressing- I  Meal Prep- I  Eating- I  Maintaining continence- I  Transferring/Ambulation- I  Managing Meds- I  Follow up appointments reviewed:  PCP Hospital f/u appt confirmed? No  Referral being placed per patient request for Assistance finding new PCP. Specialist Hospital f/u appt confirmed? No  Patient has been advised to contact Dr. Romeo Apple with Ortho.  Are transportation arrangements needed? No  If their condition worsens, is the pt aware to call PCP or go to the Emergency Dept.? Yes Was the patient provided with contact information for the PCP's office or ED? Yes Was to pt encouraged to call back with questions or concerns? Yes

## 2020-09-06 ENCOUNTER — Encounter: Payer: Self-pay | Admitting: Orthopedic Surgery

## 2020-09-06 ENCOUNTER — Other Ambulatory Visit: Payer: Self-pay

## 2020-09-06 ENCOUNTER — Ambulatory Visit: Payer: Medicaid Other | Admitting: Orthopedic Surgery

## 2020-09-06 VITALS — BP 134/95 | HR 95 | Ht 64.0 in | Wt 172.0 lb

## 2020-09-06 DIAGNOSIS — M2420 Disorder of ligament, unspecified site: Secondary | ICD-10-CM | POA: Diagnosis not present

## 2020-09-06 DIAGNOSIS — M24411 Recurrent dislocation, right shoulder: Secondary | ICD-10-CM

## 2020-09-06 NOTE — Progress Notes (Signed)
NEW PROBLEM//OFFICE VISIT  Summary assessment and plan:   35 year old female with ligament laxity multiple chronic dislocations.  Recommending consultation with Dr. Karlton Lemon for shoulder stabilization  Stay out of work until you see Dr. Minus Liberty the sling until you see Dr. Karlton Lemon  Chief Complaint  Patient presents with   Shoulder Problem    Recurrent dislocations, right shoulder while folding clothes, it dislocated, usually can get back in but had to go to ER to have reduced, previously was scheduled for surgery but had not insurance    35 year old female with chronic multiple dislocations of the right shoulder.  Redislocation from minor trauma.  Previously scheduled for surgery but did not have insurance.  The patient can usually place her shoulder back in place but on 3 or 4 occasions over the last 12 years she has had to go to the emergency room as she did on this occasion.  She had a closed reduction.  She is here today with a sling she has no other complaints other than shoulder discomfort and pain  Note she has 6 children.  She was scheduled to start a job tomorrow but could not because of the injury.  She is lost multiple jobs due to the dislocating shoulder.    MEDICAL DECISION MAKING  A.  Encounter Diagnoses  Name Primary?   Chronic dislocation of right shoulder Yes   Ligament laxity     B. DATA ANALYSED:   IMAGING: Interpretation of images: X-rays series #1 anterior dislocation right shoulder  X-ray series #2 reduction right shoulder dislocation  Orders: None  Outside records reviewed: ER records   C. MANAGEMENT   Referral for shoulder stabilization  No orders of the defined types were placed in this encounter.    BP (!) 134/95   Pulse 95   Ht 5\' 4"  (1.626 m)   Wt 172 lb (78 kg)   LMP 08/28/2020   BMI 29.52 kg/m    General appearance: Well-developed well-nourished no gross deformities  Cardiovascular normal pulse and perfusion normal color  without edema  Neurologically no sensation loss or deficits or pathologic reflexes  Psychological: Awake alert and oriented x3 mood and affect normal  Skin no lacerations or ulcerations no nodularity no palpable masses, no erythema or nodularity  Musculoskeletal:   Patient exhibits multiple signs of ligament laxity the most notable is the wrist flexion thumb to forearm sign.  She has normal sensation over the right deltoid and anterior osseous nerve function is intact  No further examination was done due to the ease at which the shoulder comes out   Exam under anesthesia would be prudent at the time of surgery  Review of Systems  All other systems reviewed and are negative.   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/31/2013   recurrant    IBS (irritable bowel syndrome)    with constipation   Shoulder dislocation, recurrent     Past Surgical History:  Procedure Laterality Date   DILATION AND CURETTAGE OF UTERUS  10/11/2017   incomplete abortion   DILATION AND EVACUATION N/A 10/11/2017   Procedure: suction dilation and evacuation;  Surgeon: 10/13/2017, MD;  Location: [facility];  Service: Gynecology;  Laterality: N/A;   TUBAL LIGATION Bilateral 02/21/2017   pt decided to not have this done   TUBAL LIGATION Bilateral 01/08/2019   Procedure: POST PARTUM TUBAL LIGATION;  Surgeon: 01/10/2019,  MD;  Location: [facility];  Service: Gynecology;  Laterality: Bilateral;   WISDOM TOOTH EXTRACTION      Family History  Problem Relation Age of Onset   Diabetes Sister    Cancer Paternal Grandmother        breast   Asthma Son    Diabetes Maternal Aunt    Cancer Paternal Aunt        cervical, breast   Colon cancer Neg Hx    Social History   Tobacco Use   Smoking status: Former    Types: Cigarettes    Quit date: 07/22/2004    Years since quitting: 16.1   Smokeless tobacco: Never  Vaping Use    Vaping Use: Never used  Substance Use Topics   Alcohol use: No    Comment: none since +UPT   Drug use: No    Allergies  Allergen Reactions   Sulfa Antibiotics Rash    No outpatient medications have been marked as taking for the 09/06/20 encounter (Office Visit) with Vickki Hearing, MD.        Fuller Canada, MD  09/06/2020 12:13 PM

## 2020-09-06 NOTE — Patient Instructions (Signed)
SCHED TO SEE DR CAIRNS  2.  OOW UNTIL SEES DR C   3.  WEAR SLING ALL THE TIME   4.  FILL OUT HER CIOXX FORMS

## 2020-09-19 ENCOUNTER — Ambulatory Visit (INDEPENDENT_AMBULATORY_CARE_PROVIDER_SITE_OTHER): Payer: Medicaid Other | Admitting: Orthopedic Surgery

## 2020-09-19 ENCOUNTER — Encounter: Payer: Self-pay | Admitting: Orthopedic Surgery

## 2020-09-19 ENCOUNTER — Other Ambulatory Visit: Payer: Self-pay

## 2020-09-19 VITALS — BP 140/82 | HR 89 | Ht 64.0 in | Wt 173.0 lb

## 2020-09-19 DIAGNOSIS — M24411 Recurrent dislocation, right shoulder: Secondary | ICD-10-CM | POA: Diagnosis not present

## 2020-09-19 NOTE — Progress Notes (Signed)
New Patient Visit  Assessment: [name] is a 35 y.o. female with the following: 1. Chronic dislocation of right shoulder  Plan: Patient has a history of multiple right shoulder dislocations, including regular subluxation events.  In addition, she has had at least 4 shoulder dislocations which required sedation and reduction in the emergency department.  She is now experiencing these events during everyday activities including putting on her bra strap, reaching for something, or folding laundry.  She has obvious apprehension on physical exam testing, and the shoulder feels unstable with anterior translation.  She is interested in considering surgery once again, and I recommended an MRI for further evaluation of the shoulder.  Given the number of dislocations, it is unclear what kind of procedure she will need, as this may require an open bony procedure.  This has been briefly discussed with the patient.  Once the MRIs been scheduled, she will schedule follow-up appointment.  At that time, we will discuss the results of the MRI, potential surgical options.   Follow-up: Return for After MRI.  Subjective:  Chief Complaint  Patient presents with   Shoulder Pain    History of rt shoulder dislocations last DOI 09/02/20.     History of Present Illness: [name] is a 36 y.o. RHD female who presents for evaluation of her right shoulder.  She has previously been evaluated by my partner, Dr. Romeo Apple.  Her most recent shoulder dislocation was approximately 3 weeks ago.  However, she has had multiple dislocations in the past.  In addition, she estimates that she has had at least 75 subluxation/dislocation events, with only a handful requiring formal sedation in the emergency department.  In 2014, she states that she was scheduled to undergo a surgical procedure, but due to insurance issues, this was ultimately canceled.  Greater than 10 years ago, she sustained her first dislocation, while  playing basketball with her husband.  She states that she has lost job opportunities due to dislocations.  Currently, she not having any pain in her shoulder.  She is not using a sling.   Review of Systems: No fevers or chills No numbness or tingling No chest pain No shortness of breath No bowel or bladder dysfunction No GI distress No headaches   Medical History:  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/31/2013   recurrant    IBS (irritable bowel syndrome)    with constipation   Shoulder dislocation, recurrent     Past Surgical History:  Procedure Laterality Date   DILATION AND CURETTAGE OF UTERUS  10/11/2017   incomplete abortion   DILATION AND EVACUATION N/A 10/11/2017   Procedure: suction dilation and evacuation;  Surgeon: Conan Bowens, MD;  Location: [facility];  Service: Gynecology;  Laterality: N/A;   TUBAL LIGATION Bilateral 02/21/2017   pt decided to not have this done   TUBAL LIGATION Bilateral 01/08/2019   Procedure: POST PARTUM TUBAL LIGATION;  Surgeon: [place] Bing, MD;  Location: [facility];  Service: Gynecology;  Laterality: Bilateral;   WISDOM TOOTH EXTRACTION      Family History  Problem Relation Age of Onset   Diabetes Sister    Cancer Paternal Grandmother        breast   Asthma Son    Diabetes Maternal Aunt    Cancer Paternal Aunt        cervical, breast   Colon cancer Neg  Hx    Social History   Tobacco Use   Smoking status: Former    Types: Cigarettes    Quit date: 07/22/2004    Years since quitting: 16.1   Smokeless tobacco: Never  Vaping Use   Vaping Use: Never used  Substance Use Topics   Alcohol use: No    Comment: none since +UPT   Drug use: No    Allergies  Allergen Reactions   Sulfa Antibiotics Rash    No outpatient medications have been marked as taking for the 09/19/20 encounter (Office Visit) with Oliver Barre, MD.    Objective: BP  140/82   Pulse 89   Ht 5\' 4"  (1.626 m)   Wt 173 lb (78.5 kg)   LMP 08/28/2020   BMI 29.70 kg/m   Physical Exam:  General: Alert and oriented. and No acute distress. Gait: Normal gait.  Evaluation of right shoulder demonstrates no deformity.  No obvious sulcus sign.  sensation is intact in the axillary patch.  Positive apprehension in the 90/90 position.  Positive Jobe's relocation test.  The humeral head is easily translated to the rim of the glenoid, but it does not translated further by design.  Fingers are warm and well-perfused.  IMAGING: I personally reviewed images previously obtained from the ED  X-ray of the right shoulder was previously obtained in the emergency department following reduction of the glenohumeral joint.  There is no obvious bony involvement of the glenoid.  No obvious large Hill-Sachs lesion.  New Medications:  No orders of the defined types were placed in this encounter.     10/29/2020, MD  09/19/2020 10:05 PM

## 2020-10-02 ENCOUNTER — Encounter: Payer: Self-pay | Admitting: Emergency Medicine

## 2020-10-02 ENCOUNTER — Ambulatory Visit
Admission: EM | Admit: 2020-10-02 | Discharge: 2020-10-02 | Disposition: A | Discharge status: Home / Self Care | Payer: Medicaid Other | Attending: Family Medicine | Admitting: Family Medicine

## 2020-10-02 DIAGNOSIS — Z1152 Encounter for screening for COVID-19: Secondary | ICD-10-CM

## 2020-10-02 DIAGNOSIS — J069 Acute upper respiratory infection, unspecified: Secondary | ICD-10-CM

## 2020-10-02 DIAGNOSIS — H65193 Other acute nonsuppurative otitis media, bilateral: Secondary | ICD-10-CM

## 2020-10-02 MED ORDER — IPRATROPIUM BROMIDE 0.03 % NA SOLN: 2.0000 | Freq: Two times a day (BID) | NASAL | 0 refills | Status: DC

## 2020-10-02 MED ORDER — PREDNISONE 20 MG PO TABS: 40.0000 mg | ORAL_TABLET | Freq: Every day | ORAL | 0 refills | Status: DC

## 2020-10-02 NOTE — ED Provider Notes (Signed)
[facility]    CSN: [id number] Arrival date & time: 10/02/20  1015      History   Chief Complaint Chief Complaint  Patient presents with   Headache   Cough   Nasal Congestion    HPI [name] is a 35 y.o. female.   HPI Sore throat, ears, headache, breathing chest tightness and back pain. Taking ibuprofen and Theraflu . No history of asthma. No sick exposure. No history of asthma or chronic respiratory disease. No shortness of breath, chest pain, nausea, or vomiting.  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/31/2013   recurrant    IBS (irritable bowel syndrome)    with constipation   Shoulder dislocation, recurrent     Patient Active Problem List   Diagnosis Date Noted   Tenderness of female pelvic organs 03/08/2020   Vaginal discharge 03/08/2020   Vaginal odor 03/08/2020   History of IBS 10/26/2019   Constipation 10/26/2019   LLQ pain 10/26/2019   Acute bilateral low back pain without sciatica 10/26/2019   History of gestational diabetes 11/02/2018   IBS (irritable bowel syndrome) 10/25/2015   BV (bacterial vaginosis) 01/18/2014    Past Surgical History:  Procedure Laterality Date   DILATION AND CURETTAGE OF UTERUS  10/11/2017   incomplete abortion   DILATION AND EVACUATION N/A 10/11/2017   Procedure: suction dilation and evacuation;  Surgeon: Conan Bowens, MD;  Location: [facility];  Service: Gynecology;  Laterality: N/A;   TUBAL LIGATION Bilateral 02/21/2017   pt decided to not have this done   TUBAL LIGATION Bilateral 01/08/2019   Procedure: POST PARTUM TUBAL LIGATION;  Surgeon: Marianna Bing, MD;  Location: [facility];  Service: Gynecology;  Laterality: Bilateral;   WISDOM TOOTH EXTRACTION      OB History     Gravida  6   Para  5   Term  4   Preterm  1   AB  1   Living  6      SAB  1   IAB      Ectopic      Multiple  1   Live Births   6            Home Medications    Prior to Admission medications   Medication Sig Start Date End Date Taking? Authorizing Provider  ipratropium (ATROVENT) 0.03 % nasal spray Place 2 sprays into both nostrils 2 (two) times daily. 10/02/20  Yes Bing Neighbors, FNP  predniSONE (DELTASONE) 20 MG tablet Take 2 tablets (40 mg total) by mouth daily with breakfast. 10/02/20  Yes Bing Neighbors, FNP  ibuprofen (ADVIL) 200 MG tablet Take 800 mg by mouth as needed. Patient not taking: No sig reported    [provider]  tinidazole (TINDAMAX) 500 MG tablet Take 4 today and 4 in am Patient not taking: No sig reported 06/22/20   Adline Potter, NP    Family History Family History  Problem Relation Age of Onset   Diabetes Sister    Cancer Paternal Grandmother        breast   Asthma Son    Diabetes Maternal Aunt    Cancer Paternal Aunt        cervical, breast   Colon cancer Neg Hx     Social History Social History   Tobacco Use   Smoking status: Former  Types: Cigarettes    Quit date: 07/22/2004    Years since quitting: 16.2   Smokeless tobacco: Never  Vaping Use   Vaping Use: Never used  Substance Use Topics   Alcohol use: No    Comment: none since +UPT   Drug use: No     Allergies   Sulfa antibiotics   Review of Systems Review of Systems Pertinent negatives listed in HPI  Physical Exam Triage Vital Signs ED Triage Vitals [10/02/20 1109]  Enc Vitals Group     BP 113/71     Pulse Rate 72     Resp 18     Temp 98 F (36.7 C)     Temp Source Oral     SpO2 99 %     Weight      Height      Head Circumference      Peak Flow      Pain Score      Pain Loc      Pain Edu?      Excl. in GC?    No data found.  Updated Vital Signs BP 113/71 (BP Location: Right Arm)   Pulse 72   Temp 98 F (36.7 C) (Oral)   Resp 18   LMP 09/26/2020 (Exact Date)   SpO2 99%   Visual Acuity Right Eye Distance:   Left Eye Distance:   Bilateral Distance:     Right Eye Near:   Left Eye Near:    Bilateral Near:     Physical Exam  General Appearance:    Alert, cooperative, no distress  HENT:   Normocephalic, B/L MEE , nares mucosal edema with congestion, rhinorrhea, oropharynx  negative   Eyes:    PERRL, conjunctiva/corneas clear, EOM's intact       Lungs:     Clear to auscultation bilaterally, respirations unlabored  Heart:    Regular rate and rhythm  Neurologic:   Awake, alert, oriented x 3. No apparent focal neurological           defect.      UC Treatments / Results  Labs (all labs ordered are listed, but only abnormal results are displayed) Labs Reviewed  COVID-19, FLU A+B NAA   Narrative:    Performed at:  [address], [place], [place]  [id number] Lab Director: Jolene Schimke MD, Phone:  [phone]    EKG   Radiology No results found.  Procedures Procedures (including critical care time)  Medications Ordered in UC Medications - No data to display  Initial Impression / Assessment and Plan / UC Course  I have reviewed the triage vital signs and the nursing notes.  Pertinent labs & imaging results that were available during my care of the patient were reviewed by me and considered in my medical decision making (see chart for details). Encounter for screening for COVID-19, Viral URI and Acute MEE (middle ear effusion) Treatment per discharge medication orders. Return precautions given Final Clinical Impressions(s) / UC Diagnoses   Final diagnoses:  Encounter for screening for COVID-19  Viral URI  Acute MEE (middle ear effusion), bilateral     Discharge Instructions      Your COVID 19 results should result within 2-4 days. Negative results are immediately resulted to Mychart. Positive results will receive a follow-up call from our clinic. If symptoms are present, I recommend home quarantine until results are known.  Alternate Tylenol and ibuprofen as needed for body aches and fever.   Symptom  management per recommendations discussed today.  If any breathing difficulty or chest pain develops go immediately to the closest emergency department for evaluation.      ED Prescriptions     Medication Sig Dispense Auth. Provider   predniSONE (DELTASONE) 20 MG tablet Take 2 tablets (40 mg total) by mouth daily with breakfast. 10 tablet Bing Neighbors, FNP   ipratropium (ATROVENT) 0.03 % nasal spray Place 2 sprays into both nostrils 2 (two) times daily. 30 mL Bing Neighbors, FNP      PDMP not reviewed this encounter.   Bing Neighbors, FNP 10/10/20 [phone]

## 2020-10-02 NOTE — ED Triage Notes (Signed)
Pt presents today with headache, cough, ear pain and nasal congestion x 2 days. Denies fever.

## 2020-10-02 NOTE — Discharge Instructions (Signed)
Your COVID 19 results should result within 2-4 days. Negative results are immediately resulted to Mychart. Positive results will receive a follow-up call from our clinic. If symptoms are present, I recommend home quarantine until results are known.  Alternate Tylenol and ibuprofen as needed for body aches and fever.  Symptom management per recommendations discussed today.  If any breathing difficulty or chest pain develops go immediately to the closest emergency department for evaluation.  

## 2020-10-03 LAB — COVID-19, FLU A+B NAA
Influenza A, NAA: NOT DETECTED
Influenza B, NAA: NOT DETECTED
SARS-CoV-2, NAA: NOT DETECTED

## 2020-10-04 DIAGNOSIS — M11011 Hydroxyapatite deposition disease, right shoulder: Secondary | ICD-10-CM | POA: Diagnosis not present

## 2020-10-04 DIAGNOSIS — S43491A Other sprain of right shoulder joint, initial encounter: Secondary | ICD-10-CM | POA: Diagnosis not present

## 2020-10-04 DIAGNOSIS — M19011 Primary osteoarthritis, right shoulder: Secondary | ICD-10-CM | POA: Diagnosis not present

## 2020-10-07 ENCOUNTER — Emergency Department (HOSPITAL_COMMUNITY)
Admission: EM | Admit: 2020-10-07 | Discharge: 2020-10-07 | Discharge status: Against Medical Advice | Payer: Medicaid Other

## 2020-10-07 ENCOUNTER — Other Ambulatory Visit: Payer: Self-pay

## 2020-10-08 ENCOUNTER — Telehealth: Payer: Self-pay | Admitting: Orthopedic Surgery

## 2020-10-08 NOTE — Telephone Encounter (Signed)
Spoke with patient regarding having the MRI - states was done on 10/04/20 at [facility] in [place]; said has the CD - appointment offered; patient will call back when she has work schedule.

## 2020-10-15 ENCOUNTER — Telehealth: Payer: Self-pay | Admitting: Orthopedic Surgery

## 2020-10-15 NOTE — Telephone Encounter (Signed)
Patient called and wanting some pain medicine for her right shoulder.  She said her shoulder was dislocated again on Sunday 10/07/20  She went to [facility]  but while she was waiting to be seen she pushed her shoulder up against the chair and she put it back in place.   She is in pain and Tylenol is not working now.  She is requesting some pain medicine.    She has had her MRI done and she has the DISC, she just hasn't made an appt to be seen again.     I have made her an appt for tomorrow 10/16/20 at  9:50.  I advised her to be here at 9:30 am you might want her to have a X-RAY done and to make sure she brings the disc with her tomorrow too.

## 2020-10-16 ENCOUNTER — Ambulatory Visit: Payer: Medicaid Other | Admitting: Orthopedic Surgery

## 2020-10-16 ENCOUNTER — Encounter: Payer: Self-pay | Admitting: Orthopedic Surgery

## 2020-10-16 ENCOUNTER — Other Ambulatory Visit: Payer: Self-pay

## 2020-10-16 VITALS — BP 135/83 | HR 84 | Ht 64.0 in | Wt 172.0 lb

## 2020-10-16 DIAGNOSIS — M24411 Recurrent dislocation, right shoulder: Secondary | ICD-10-CM

## 2020-10-16 MED ORDER — DICLOFENAC SODIUM 50 MG PO TBEC: 50.0000 mg | DELAYED_RELEASE_TABLET | Freq: Two times a day (BID) | ORAL | 0 refills | Status: DC

## 2020-10-16 NOTE — Progress Notes (Signed)
Orthopaedic Clinic Return  Assessment: [name] is a 35 y.o. female with the following: Chronic right shoulder instability; recurrent dislocation, most recent 1 week ago  Plan: Reviewed the MRI with the patient in clinic today of her right shoulder.  This demonstrates an anterior and inferior labrum tear, with diminutive labrum throughout the right shoulder.  There is a small Hill-Sachs lesion.  Given the chronicity of her symptoms, and recurrent dislocations, I have recommended surgery.  Based on my evaluation at this time, I think we can proceed with a right shoulder anterior stabilization arthroscopically.  There does not appear to be any bony involvement.  Risks and benefits of the surgery, including, but not limited to infection, bleeding, persistent pain, need for further surgery, recurrent dislocations and more severe complications associated with anesthesia were discussed with the patient.  The patient has elected to proceed.  Due to pain related to her recent dislocation, I have provided her with a prescription for diclofenac.  She should limit her activities to the planned date of surgery.  Expected recovery, including the use of the sling postop was discussed with the patient.  She is interested in proceeding.  All questions were answered.  Surgical Plan  Procedure: Right shoulder arthroscopy, with anterior stabilization and labral repair; 11/15/2020 Disposition: Outpatient Anesthesia: General, with a block Medical Comorbidities: None Notes: Patient will be lateral, on a beanbag.    Meds ordered this encounter  Medications   diclofenac (VOLTAREN) 50 MG EC tablet    Sig: Take 1 tablet (50 mg total) by mouth 2 (two) times daily.    Dispense:  60 tablet    Refill:  0    Body mass index is 29.52 kg/m.  Follow-up: Return for After surgery; DOS 11/15/20.   Subjective:  Chief Complaint  Patient presents with   Shoulder Injury    Pt with chronic shoulder dislocations  and states her pain is 6/10. Pt states she's been having more pain since her last dislocation that she went to the ER about, states she's having more dislocations and it's making a noise now.     History of Present Illness: [name] is a 35 y.o. female who returns to clinic for repeat evaluation of her right shoulder.  She has a history of many dislocation and subluxation events in the past.  She was previously scheduled for surgery on her right shoulder, but insurance issues effectively canceled the surgery.  Since she was last seen, in the days immediately following the MRI, she did sustain a repeat dislocation.  She went to the emergency department, but was able to get it reduced prior to being evaluated.  Since then, she continues to have pain in the right shoulder.  She is not using a sling.  She demonstrates apprehension, and also describes apprehension.  She has some radiating pains into her hand.  All of her pain is in the anterior aspect of her right shoulder.  Review of Systems: No fevers or chills No numbness or tingling No chest pain No shortness of breath No bowel or bladder dysfunction No GI distress No headaches   Objective: BP 135/83   Pulse 84   Ht 5\' 4"  (1.626 m)   Wt 172 lb (78 kg)   LMP 09/26/2020 (Exact Date)   BMI 29.52 kg/m   Physical Exam:  Alert and oriented.  No acute distress.  Right shoulder without obvious deformity.  Limited range of motion, with obvious apprehension.  Sensation intact over the  axillary patch.  Sensation is intact in the superficial radial, median and ulnar nerve distribution.  Abduction at her side to 75 degrees before apprehension.  Active motion intact in the AIN/PIN/U nerve distribution.  Fingers are warm and well-perfused.  2+ radial pulse.  IMAGING: I personally ordered and reviewed the following images:  MRI of the right shoulder demonstrates a small, chronic appearing Hill-Sachs lesion.  The anterior and inferior labrum is  torn.  The remaining portions of the labrum are diminutive in size.  Oliver Barre, MD 10/16/2020 1:18 PM

## 2020-10-18 ENCOUNTER — Ambulatory Visit: Payer: Self-pay | Admitting: Orthopedic Surgery

## 2020-10-26 ENCOUNTER — Telehealth: Payer: Self-pay | Admitting: Orthopedic Surgery

## 2020-10-26 NOTE — Telephone Encounter (Signed)
Done

## 2020-10-26 NOTE — Telephone Encounter (Signed)
Patient requests to reschedule surgery date from 11/15/20, due to having to be out of town - states unable to get a refund; therefore, requests to schedule it around the 3rd week in October. Please advise.

## 2020-11-03 ENCOUNTER — Other Ambulatory Visit: Payer: Self-pay | Admitting: Adult Health

## 2020-11-19 NOTE — Telephone Encounter (Signed)
Per chart note, patient aware of surgery date change to 12/10/20.

## 2020-12-05 NOTE — Patient Instructions (Signed)
[name]  12/05/2020     @PREFPERIOPPHARMACY @   Your procedure is scheduled on  12/10/2020.   Report to 12/12/2020 at  [phone] A.M.   Call this number if you have problems the morning of surgery:  [phone]   Remember:  Do not eat or drink after midnight.      Take these medicines the morning of surgery with A SIP OF WATER                                        None     Do not wear jewelry, make-up or nail polish.  Do not wear lotions, powders, or perfumes, or deodorant.  Do not shave 48 hours prior to surgery.  Men may shave face and neck.  Do not bring valuables to the hospital.  [facility] is not responsible for any belongings or valuables.  Contacts, dentures or bridgework may not be worn into surgery.  Leave your suitcase in the car.  After surgery it may be brought to your room.  For patients admitted to the hospital, discharge time will be determined by your treatment team.  Patients discharged the day of surgery will not be allowed to drive home and must have someone with them for 24 hours.    Special instructions:   DO NOT smoke tobacco or vape for 24 hours before your procedure.  Please read over the following fact sheets that you were given. Anesthesia Post-op Instructions and Care and Recovery After Surgery      Shoulder Arthroscopy, Care After The following information offers guidance on how to care for yourself after your procedure. Your health care provider may also give you more specific instructions. If you have problems or questions, contact your health care provider. What can I expect after the procedure? After the procedure, it is common to have: Pain. Swelling. A small amount of fluid from the incision. Stiffness that improves over time. Follow these instructions at home: If you have a sling or an immobilizer: Wear it as told by your health care provider. Remove it only as told by your health care provider. These devices  protect your shoulder and help it heal by keeping it in place. Check the skin around it every day. Tell your health care provider about any concerns. Loosen it if your fingers tingle, become numb, or turn cold and blue. Keep the sling or immobilizer clean. If it is not waterproof: Do not let it get wet. Cover it with a watertight covering when you take a bath or a shower. Incision care  Follow instructions from your health care provider about how to take care of your incisions. Make sure you: Wash your hands with soap and water for at least 20 seconds before and after you change your bandage (dressing). If soap and water are not available, use hand sanitizer. Change your dressing as told by your health care provider. Leave stitches (sutures), skin glue, or adhesive strips in place. These skin closures may need to stay in place for 2 weeks or longer. If adhesive strip edges start to loosen and curl up, you may trim the loose edges. Do not remove adhesive strips completely unless your health care provider tells you to do that. Check your incision areas every day for signs of infection. Check for: Redness. More swelling or pain. Blood or  more fluid. Warmth. Pus or a bad smell. Do not take baths, swim, or use a hot tub until your health care provider approves. Ask your health care provider if you may take showers. You may only be allowed to take sponge baths. Managing pain, stiffness, and swelling  If directed, put ice on the affected area. To do this: If you have a removable sling or immobilizer, remove it as told by your health care provider. Put ice in a plastic bag or use the icing device (cold therapy unit) that you were given. Follow instructions from your health care provider about how to use the icing device. Place a towel between your skin and the bag or between your skin and the icing device. Leave the ice on for 20 minutes, 2-3 times a day. Remove the ice if your skin turns bright  red. This is very important. If you cannot feel pain, heat, or cold, you have a greater risk of damage to the area. Move your fingers often to reduce stiffness and swelling. Raise (elevate) the injured area above the level of your heart while you are lying down. It may help to sleep in a sitting position for a few days after your procedure. Try sleeping in a reclining chair or propping yourself up with extra pillows in bed. Activity Ask your health care provider what activities are safe for you during recovery. Do not lift with your affected shoulder until your health care provider approves. Avoid pulling and pushing with the arm on your affected side. If physical therapy was prescribed, do exercises as directed. Doing exercises may help to improve shoulder movement and flexibility (range of motion). Driving Ask your health care provider when it is safe to drive if you have a sling or immobilizer. Ask your health care provider if the medicine prescribed to you requires you to avoid driving or using machinery. General instructions Take over-the-counter and prescription medicines only as told by your health care provider. Ask your health care provider if the medicine prescribed to you can cause constipation. You may need to take these actions to prevent or treat constipation: Drink enough fluid to keep your urine pale yellow. Take over-the-counter or prescription medicines. Eat foods that are high in fiber, such as beans, whole grains, and fresh fruits and vegetables. Limit foods that are high in fat and processed sugars, such as fried or sweet foods. Do not use any products that contain nicotine or tobacco. These products include cigarettes, chewing tobacco, and vaping devices, such as e-cigarettes. These can delay incision healing after surgery. If you need help quitting, ask your health care provider. Keep all follow-up visits. This is important. Contact a health care provider if: You have a  fever or chills. You have severe pain. You have any of these signs of infection: Redness around an incision. More swelling or pain in an incision area. Blood or more fluid coming from an incision. Warmth coming from an incision. Pus or a bad smell coming from an incision. You notice that an incision has opened up. You develop a rash. Get help right away if: You have difficulty breathing. You have chest pain. You notice that your fingers tingle, are numb, or are cold and blue even after you loosen your sling or immobilizer. You develop pain in your lower leg or at the back of your knee. These symptoms may represent a serious problem that is an emergency. Do not wait to see if the symptoms will go away. Get medical  help right away. Call your local emergency services (911 in the [place] Do not drive yourself to the hospital. Summary If you have a sling or an immobilizer, wear it as told by your health care provider. It may help to sleep in a sitting position for a few days after your procedure. If physical therapy was prescribed, do exercises as directed. Doing exercises may help to improve shoulder movement and flexibility (range of motion). Keep all follow-up visits. This is important. This information is not intended to replace advice given to you by your health care provider. Make sure you discuss any questions you have with your health care provider. Document Revised: 10/10/2019 Document Reviewed: 10/10/2019 Elsevier Patient Education  2022 [employer] General Anesthesia, Adult, Care After This sheet gives you information about how to care for yourself after your procedure. Your health care provider may also give you more specific instructions. If you have problems or questions, contact your health care provider. What can I expect after the procedure? After the procedure, the following side effects are common: Pain or discomfort at the IV site. Nausea. Vomiting. Sore  throat. Trouble concentrating. Feeling cold or chills. Feeling weak or tired. Sleepiness and fatigue. Soreness and body aches. These side effects can affect parts of the body that were not involved in surgery. Follow these instructions at home: For the time period you were told by your health care provider:  Rest. Do not participate in activities where you could fall or become injured. Do not drive or use machinery. Do not drink alcohol. Do not take sleeping pills or medicines that cause drowsiness. Do not make important decisions or sign legal documents. Do not take care of children on your own. Eating and drinking Follow any instructions from your health care provider about eating or drinking restrictions. When you feel hungry, start by eating small amounts of foods that are soft and easy to digest (bland), such as toast. Gradually return to your regular diet. Drink enough fluid to keep your urine pale yellow. If you vomit, rehydrate by drinking water, juice, or clear broth. General instructions If you have sleep apnea, surgery and certain medicines can increase your risk for breathing problems. Follow instructions from your health care provider about wearing your sleep device: Anytime you are sleeping, including during daytime naps. While taking prescription pain medicines, sleeping medicines, or medicines that make you drowsy. Have a responsible adult stay with you for the time you are told. It is important to have someone help care for you until you are awake and alert. Return to your normal activities as told by your health care provider. Ask your health care provider what activities are safe for you. Take over-the-counter and prescription medicines only as told by your health care provider. If you smoke, do not smoke without supervision. Keep all follow-up visits as told by your health care provider. This is important. Contact a health care provider if: You have nausea or  vomiting that does not get better with medicine. You cannot eat or drink without vomiting. You have pain that does not get better with medicine. You are unable to pass urine. You develop a skin rash. You have a fever. You have redness around your IV site that gets worse. Get help right away if: You have difficulty breathing. You have chest pain. You have blood in your urine or stool, or you vomit blood. Summary After the procedure, it is common to have a sore throat or nausea. It is also common  to feel tired. Have a responsible adult stay with you for the time you are told. It is important to have someone help care for you until you are awake and alert. When you feel hungry, start by eating small amounts of foods that are soft and easy to digest (bland), such as toast. Gradually return to your regular diet. Drink enough fluid to keep your urine pale yellow. Return to your normal activities as told by your health care provider. Ask your health care provider what activities are safe for you. This information is not intended to replace advice given to you by your health care provider. Make sure you discuss any questions you have with your health care provider. Document Revised: 10/27/2019 Document Reviewed: 05/26/2019 Elsevier Patient Education  2022 [employer] How to Use Chlorhexidine for Bathing Chlorhexidine gluconate (CHG) is a germ-killing (antiseptic) solution that is used to clean the skin. It can get rid of the bacteria that normally live on the skin and can keep them away for about 24 hours. To clean your skin with CHG, you may be given: A CHG solution to use in the shower or as part of a sponge bath. A prepackaged cloth that contains CHG. Cleaning your skin with CHG may help lower the risk for infection: While you are staying in the intensive care unit of the hospital. If you have a vascular access, such as a central line, to provide short-term or long-term access to your  veins. If you have a catheter to drain urine from your bladder. If you are on a ventilator. A ventilator is a machine that helps you breathe by moving air in and out of your lungs. After surgery. What are the risks? Risks of using CHG include: A skin reaction. Hearing loss, if CHG gets in your ears and you have a perforated eardrum. Eye injury, if CHG gets in your eyes and is not rinsed out. The CHG product catching fire. Make sure that you avoid smoking and flames after applying CHG to your skin. Do not use CHG: If you have a chlorhexidine allergy or have previously reacted to chlorhexidine. On babies younger than 5 months of age. How to use CHG solution Use CHG only as told by your health care provider, and follow the instructions on the label. Use the full amount of CHG as directed. Usually, this is one bottle. During a shower Follow these steps when using CHG solution during a shower (unless your health care provider gives you different instructions): Start the shower. Use your normal soap and shampoo to wash your face and hair. Turn off the shower or move out of the shower stream. Pour the CHG onto a clean washcloth. Do not use any type of brush or rough-edged sponge. Starting at your neck, lather your body down to your toes. Make sure you follow these instructions: If you will be having surgery, pay special attention to the part of your body where you will be having surgery. Scrub this area for at least 1 minute. Do not use CHG on your head or face. If the solution gets into your ears or eyes, rinse them well with water. Avoid your genital area. Avoid any areas of skin that have broken skin, cuts, or scrapes. Scrub your back and under your arms. Make sure to wash skin folds. Let the lather sit on your skin for 1-2 minutes or as long as told by your health care provider. Thoroughly rinse your entire body in the shower. Make sure  that all body creases and crevices are rinsed  well. Dry off with a clean towel. Do not put any substances on your body afterward--such as powder, lotion, or perfume--unless you are told to do so by your health care provider. Only use lotions that are recommended by the manufacturer. Put on clean clothes or pajamas. If it is the night before your surgery, sleep in clean sheets.  During a sponge bath Follow these steps when using CHG solution during a sponge bath (unless your health care provider gives you different instructions): Use your normal soap and shampoo to wash your face and hair. Pour the CHG onto a clean washcloth. Starting at your neck, lather your body down to your toes. Make sure you follow these instructions: If you will be having surgery, pay special attention to the part of your body where you will be having surgery. Scrub this area for at least 1 minute. Do not use CHG on your head or face. If the solution gets into your ears or eyes, rinse them well with water. Avoid your genital area. Avoid any areas of skin that have broken skin, cuts, or scrapes. Scrub your back and under your arms. Make sure to wash skin folds. Let the lather sit on your skin for 1-2 minutes or as long as told by your health care provider. Using a different clean, wet washcloth, thoroughly rinse your entire body. Make sure that all body creases and crevices are rinsed well. Dry off with a clean towel. Do not put any substances on your body afterward--such as powder, lotion, or perfume--unless you are told to do so by your health care provider. Only use lotions that are recommended by the manufacturer. Put on clean clothes or pajamas. If it is the night before your surgery, sleep in clean sheets. How to use CHG prepackaged cloths Only use CHG cloths as told by your health care provider, and follow the instructions on the label. Use the CHG cloth on clean, dry skin. Do not use the CHG cloth on your head or face unless your health care provider tells  you to. When washing with the CHG cloth: Avoid your genital area. Avoid any areas of skin that have broken skin, cuts, or scrapes. Before surgery Follow these steps when using a CHG cloth to clean before surgery (unless your health care provider gives you different instructions): Using the CHG cloth, vigorously scrub the part of your body where you will be having surgery. Scrub using a back-and-forth motion for 3 minutes. The area on your body should be completely wet with CHG when you are done scrubbing. Do not rinse. Discard the cloth and let the area air-dry. Do not put any substances on the area afterward, such as powder, lotion, or perfume. Put on clean clothes or pajamas. If it is the night before your surgery, sleep in clean sheets.  For general bathing Follow these steps when using CHG cloths for general bathing (unless your health care provider gives you different instructions). Use a separate CHG cloth for each area of your body. Make sure you wash between any folds of skin and between your fingers and toes. Wash your body in the following order, switching to a new cloth after each step: The front of your neck, shoulders, and chest. Both of your arms, under your arms, and your hands. Your stomach and groin area, avoiding the genitals. Your right leg and foot. Your left leg and foot. The back of your neck, your back, and  your buttocks. Do not rinse. Discard the cloth and let the area air-dry. Do not put any substances on your body afterward--such as powder, lotion, or perfume--unless you are told to do so by your health care provider. Only use lotions that are recommended by the manufacturer. Put on clean clothes or pajamas. Contact a health care provider if: Your skin gets irritated after scrubbing. You have questions about using your solution or cloth. You swallow any chlorhexidine. Call your local poison control center ([phone] in the [place] Get help right away if: Your  eyes itch badly, or they become very red or swollen. Your skin itches badly and is red or swollen. Your hearing changes. You have trouble seeing. You have swelling or tingling in your mouth or throat. You have trouble breathing. These symptoms may represent a serious problem that is an emergency. Do not wait to see if the symptoms will go away. Get medical help right away. Call your local emergency services (911 in the [place] Do not drive yourself to the hospital. Summary Chlorhexidine gluconate (CHG) is a germ-killing (antiseptic) solution that is used to clean the skin. Cleaning your skin with CHG may help to lower your risk for infection. You may be given CHG to use for bathing. It may be in a bottle or in a prepackaged cloth to use on your skin. Carefully follow your health care provider's instructions and the instructions on the product label. Do not use CHG if you have a chlorhexidine allergy. Contact your health care provider if your skin gets irritated after scrubbing. This information is not intended to replace advice given to you by your health care provider. Make sure you discuss any questions you have with your health care provider. Document Revised: 04/23/2020 Document Reviewed: 04/23/2020 Elsevier Patient Education  2022 [employer].

## 2020-12-06 ENCOUNTER — Encounter (HOSPITAL_COMMUNITY): Payer: Self-pay

## 2020-12-06 ENCOUNTER — Encounter (HOSPITAL_COMMUNITY)
Admission: RE | Admit: 2020-12-06 | Discharge: 2020-12-06 | Disposition: A | Discharge status: Home / Self Care | Payer: Medicaid Other | Source: Ambulatory Visit | Attending: Orthopedic Surgery | Admitting: Orthopedic Surgery

## 2020-12-06 ENCOUNTER — Encounter (HOSPITAL_COMMUNITY): Payer: Medicaid Other

## 2020-12-06 NOTE — Pre-Procedure Instructions (Signed)
Interoffice message sent to [name] to inform her that patient did not show for her pre-op appointment.

## 2020-12-07 ENCOUNTER — Telehealth: Payer: Self-pay

## 2020-12-07 NOTE — Telephone Encounter (Signed)
Pt was a no show for scheduled preop on 10/13 for surgery on 10/17. Called pt to see what happened, states she has children at home sick awaiting COVID results. Pt is also emergency contact for her father who is in the hospital. Pt states she will give [place] a call when she's able to reschedule surgery. Provider notified.

## 2020-12-10 ENCOUNTER — Encounter (HOSPITAL_COMMUNITY): Admission: RE | Payer: Self-pay | Source: Home / Self Care

## 2020-12-10 ENCOUNTER — Ambulatory Visit (HOSPITAL_COMMUNITY): Admission: RE | Admit: 2020-12-10 | Payer: Medicaid Other | Source: Home / Self Care | Admitting: Orthopedic Surgery

## 2020-12-10 SURGERY — SHOULDER ARTHROSCOPY WITH BANKART REPAIR
Anesthesia: Choice | Site: Shoulder | Laterality: Right

## 2020-12-13 ENCOUNTER — Other Ambulatory Visit: Payer: Self-pay | Admitting: Adult Health

## 2020-12-13 MED ORDER — FLUCONAZOLE 150 MG PO TABS: ORAL_TABLET | ORAL | 1 refills | Status: DC

## 2020-12-13 MED ORDER — TINIDAZOLE 500 MG PO TABS: ORAL_TABLET | ORAL | 0 refills | Status: DC

## 2020-12-13 NOTE — Progress Notes (Signed)
Rx diflucan and tindamax

## 2021-01-16 ENCOUNTER — Telehealth: Payer: Self-pay | Admitting: Orthopedic Surgery

## 2021-01-16 NOTE — Telephone Encounter (Signed)
Patient called to relay that she has just dislocated her same shoulder again, and is asking if it is something she needs to go to the emergency room for - I relayed to patient that is correct, as this is a procedure done at the hospital, and cannot be performed in the office. Patient  said she will go now to [facility] Emergency room.

## 2021-02-10 ENCOUNTER — Emergency Department (HOSPITAL_COMMUNITY): Payer: Medicaid Other

## 2021-02-10 ENCOUNTER — Encounter (HOSPITAL_COMMUNITY): Payer: Self-pay | Admitting: Emergency Medicine

## 2021-02-10 ENCOUNTER — Emergency Department (HOSPITAL_COMMUNITY)
Admission: EM | Admit: 2021-02-10 | Discharge: 2021-02-10 | Disposition: A | Discharge status: Home / Self Care | Payer: Medicaid Other | Attending: Emergency Medicine | Admitting: Emergency Medicine

## 2021-02-10 ENCOUNTER — Other Ambulatory Visit: Payer: Self-pay

## 2021-02-10 DIAGNOSIS — S43004A Unspecified dislocation of right shoulder joint, initial encounter: Secondary | ICD-10-CM | POA: Diagnosis not present

## 2021-02-10 DIAGNOSIS — W06XXXA Fall from bed, initial encounter: Secondary | ICD-10-CM | POA: Insufficient documentation

## 2021-02-10 DIAGNOSIS — Z87891 Personal history of nicotine dependence: Secondary | ICD-10-CM | POA: Diagnosis not present

## 2021-02-10 DIAGNOSIS — S43014A Anterior dislocation of right humerus, initial encounter: Secondary | ICD-10-CM | POA: Diagnosis not present

## 2021-02-10 DIAGNOSIS — S4991XA Unspecified injury of right shoulder and upper arm, initial encounter: Secondary | ICD-10-CM | POA: Diagnosis present

## 2021-02-10 MED ORDER — HYDROMORPHONE HCL 1 MG/ML IJ SOLN
1.0000 mg | Freq: Once | INTRAMUSCULAR | Status: AC
  Administered 2021-02-10: 05:00:00 1 mg via INTRAVENOUS
  Filled 2021-02-10: qty 1

## 2021-02-10 MED ORDER — ONDANSETRON HCL 4 MG/2ML IJ SOLN
4.0000 mg | Freq: Once | INTRAMUSCULAR | Status: AC
  Administered 2021-02-10: 04:00:00 4 mg via INTRAVENOUS
  Filled 2021-02-10: qty 2

## 2021-02-10 MED ORDER — MIDAZOLAM HCL 2 MG/2ML IJ SOLN
2.0000 mg | Freq: Once | INTRAMUSCULAR | Status: AC
  Administered 2021-02-10: 04:00:00 2 mg via INTRAVENOUS
  Filled 2021-02-10: qty 2

## 2021-02-10 NOTE — ED Notes (Signed)
Dr Bero at bedside.  

## 2021-02-10 NOTE — ED Provider Notes (Signed)
AP-EMERGENCY DEPT [facility] Emergency Department Provider Note MRN:  [id number]  Arrival date & time: 02/10/21     Chief Complaint   Dislocated shoulder   History of Present Illness   [name] is a 35 y.o. year-old female with a history of shoulder dislocation presenting to the ED with chief complaint of shoulder dislocation.  Patient was reaching for something on the ground and fell off of the bed and then she experienced sudden pain in the right shoulder similar to prior shoulder dislocations.  Denies falling, no trauma.  No other complaints.  Pain is moderate to severe, constant, worse with attempted motion of the arm.  Review of Systems  A complete 10 system review of systems was obtained and all systems are negative except as noted in the HPI and PMH.   Patient's Health History    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/31/2013   recurrant    IBS (irritable bowel syndrome)    with constipation   Shoulder dislocation, recurrent     Past Surgical History:  Procedure Laterality Date   DILATION AND CURETTAGE OF UTERUS  10/11/2017   incomplete abortion   DILATION AND EVACUATION N/A 10/11/2017   Procedure: suction dilation and evacuation;  Surgeon: Conan Bowens, MD;  Location: [facility] ORS;  Service: Gynecology;  Laterality: N/A;   TUBAL LIGATION Bilateral 02/21/2017   pt decided to not have this done   TUBAL LIGATION Bilateral 01/08/2019   Procedure: POST PARTUM TUBAL LIGATION;  Surgeon: [place] Bing, MD;  Location: [facility] ORS;  Service: Gynecology;  Laterality: Bilateral;   WISDOM TOOTH EXTRACTION      Family History  Problem Relation Age of Onset   Diabetes Sister    Cancer Paternal Grandmother        breast   Asthma Son    Diabetes Maternal Aunt    Cancer Paternal Aunt        cervical, breast   Colon cancer Neg Hx     Social History   Socioeconomic History   Marital  status: Married    Spouse name: Nayman   Number of children: 6   Years of education: 12   Highest education level: 12th grade  Occupational History   Not on file  Tobacco Use   Smoking status: Former    Types: Cigarettes    Quit date: 07/22/2004    Years since quitting: 16.5   Smokeless tobacco: Never  Vaping Use   Vaping Use: Never used  Substance and Sexual Activity   Alcohol use: No    Comment: none since +UPT   Drug use: No   Sexual activity: Yes    Birth control/protection: Surgical    Comment: tubal  Other Topics Concern   Not on file  Social History Narrative   Not on file   Social Determinants of Health   Financial Resource Strain: Not on file  Food Insecurity: Not on file  Transportation Needs: Not on file  Physical Activity: Not on file  Stress: Not on file  Social Connections: Not on file  Intimate Partner Violence: Not on file     Physical Exam   Vitals:   02/10/21 0425 02/10/21 0430  BP: (!) 118/55 123/66  Pulse: 84 90  Resp: 18 18  Temp:  98 F (36.7 C)  SpO2: 100% 100%    CONSTITUTIONAL: Well-appearing, NAD NEURO:  Alert and  oriented x 3, no focal deficits EYES:  eyes equal and reactive ENT/NECK:  no LAD, no JVD CARDIO: Regular rate, well-perfused, normal S1 and S2 PULM:  CTAB no wheezing or rhonchi GI/GU:  normal bowel sounds, non-distended, non-tender MSK/SPINE:  No gross deformities, no edema; palpable concavity of the right shoulder, limb is neurovascularly intact SKIN:  no rash, atraumatic PSYCH:  Appropriate speech and behavior  *Additional and/or pertinent findings included in MDM below  Diagnostic and Interventional Summary    EKG Interpretation  Date/Time:    Ventricular Rate:    PR Interval:    QRS Duration:   QT Interval:    QTC Calculation:   R Axis:     Text Interpretation:         Labs Reviewed - No data to display  DG Shoulder Right  Final Result    DG Shoulder Right    (Results Pending)    Medications   HYDROmorphone (DILAUDID) injection 1 mg (1 mg Intravenous Given 02/10/21 0430)  midazolam (VERSED) injection 2 mg (2 mg Intravenous Given 02/10/21 0429)  ondansetron (ZOFRAN) injection 4 mg (4 mg Intravenous Given 02/10/21 0427)     Procedures  /  Critical Care Reduction of dislocation  Date/Time: 02/10/2021 4:54 AM Performed by: Sabas Sous, MD Authorized by: Sabas Sous, MD  Consent: Verbal consent obtained. Risks and benefits: risks, benefits and alternatives were discussed Consent given by: patient Patient understanding: patient states understanding of the procedure being performed Site marked: the operative site was marked Imaging studies: imaging studies available Patient identity confirmed: verbally with patient Time out: Immediately prior to procedure a "time out" was called to verify the correct patient, procedure, equipment, support staff and site/side marked as required.  Sedation: Patient sedated: yes Sedation type: anxiolysis Sedatives: midazolam Analgesia: hydromorphone Vitals: Vital signs were monitored during sedation.  Patient tolerance: patient tolerated the procedure well with no immediate complications Comments: Successful reduction of right shoulder dislocation using light traction with axillary pressure    ED Course and Medical Decision Making  I have reviewed the triage vital signs, the nursing notes, and pertinent available records from the EMR.  Listed above are laboratory and imaging tests that I personally ordered, reviewed, and interpreted and then considered in my medical decision making (see below for details).  Shoulder dislocation, history of this multiple times, has had it reduced without formal sedation in the past.  Reduced as described above using a combination of midazolam and Dilaudid.  Will refer to orthopedics.       Elmer Sow. Pilar Plate, MD [facility] Emergency [facility] [email or web address]  Final  Clinical Impressions(s) / ED Diagnoses     ICD-10-CM   1. Dislocation of right shoulder joint, initial encounter  S43.004A       ED Discharge Orders     None        Discharge Instructions Discussed with and Provided to Patient:     Discharge Instructions      You were evaluated in the Emergency Department and after careful evaluation, we did not find any emergent condition requiring admission or further testing in the hospital.  Your exam/testing today is overall reassuring.  We were able to reduce your shoulder dislocation here in the emergency department.  We recommend follow-up with an orthopedic specialist for operative repair.  Please return to the Emergency Department if you experience any worsening of your condition.   Thank you for allowing [place] to be a part of  your care.        Sabas Sous, MD 02/10/21 [phone]

## 2021-02-10 NOTE — ED Triage Notes (Signed)
Pt states she dislocated her R shoulder this evening. Pt has had this happen before.

## 2021-02-10 NOTE — Discharge Instructions (Addendum)
You were evaluated in the Emergency Department and after careful evaluation, we did not find any emergent condition requiring admission or further testing in the hospital.  Your exam/testing today is overall reassuring.  We were able to reduce your shoulder dislocation here in the emergency department.  We recommend follow-up with an orthopedic specialist for operative repair.  Please return to the Emergency Department if you experience any worsening of your condition.   Thank you for allowing [place] to be a part of your care.

## 2021-02-12 ENCOUNTER — Telehealth: Payer: Self-pay

## 2021-02-12 NOTE — Telephone Encounter (Signed)
Transition Care Management Unsuccessful Follow-up Telephone Call  Date of discharge and from where:  02/10/2021 from [facility]  Attempts:  1st Attempt  Reason for unsuccessful TCM follow-up call:  Left voice message

## 2021-02-13 NOTE — Telephone Encounter (Signed)
Transition Care Management Unsuccessful Follow-up Telephone Call  Date of discharge and from where:  02/10/2021 from [facility]  Attempts:  2nd Attempt  Reason for unsuccessful TCM follow-up call:  Left voice message

## 2021-02-14 NOTE — Telephone Encounter (Signed)
Transition Care Management Unsuccessful Follow-up Telephone Call ° °Date of discharge and from where:  02/10/2021 from [place] ° °Attempts:  3rd Attempt ° °Reason for unsuccessful TCM follow-up call:  Unable to reach patient ° ° ° °

## 2021-03-05 ENCOUNTER — Telehealth: Payer: Self-pay

## 2021-03-05 DIAGNOSIS — R309 Painful micturition, unspecified: Secondary | ICD-10-CM | POA: Diagnosis not present

## 2021-03-05 DIAGNOSIS — N390 Urinary tract infection, site not specified: Secondary | ICD-10-CM | POA: Diagnosis not present

## 2021-03-05 DIAGNOSIS — R350 Frequency of micturition: Secondary | ICD-10-CM | POA: Diagnosis not present

## 2021-03-05 NOTE — Telephone Encounter (Signed)
LVM for a call back

## 2021-03-05 NOTE — Telephone Encounter (Signed)
Patient called and would like for you to call her she has some questions about her shoulder surgery. I told her that you were in clinic and would give her a call later today . [phone]

## 2021-03-07 ENCOUNTER — Telehealth: Payer: Self-pay | Admitting: Orthopedic Surgery

## 2021-03-07 NOTE — Telephone Encounter (Signed)
Patient called and is returning Dr. Dallas Schimke nurse called and left message.   Please call her back

## 2021-03-07 NOTE — Telephone Encounter (Signed)
I called patient back, LMVM for patient to call us back.

## 2021-03-07 NOTE — Telephone Encounter (Signed)
She had questions about the surgery Leta called her back left message Will you see if you can call her ?  Im in clinic currently with Dr Romeo Apple, If you cant call her I will call her during lunch or after clinic this afternoon just send back to me.

## 2021-03-12 ENCOUNTER — Encounter: Payer: Self-pay | Admitting: Orthopedic Surgery

## 2021-03-12 ENCOUNTER — Ambulatory Visit (INDEPENDENT_AMBULATORY_CARE_PROVIDER_SITE_OTHER): Payer: Medicaid Other

## 2021-03-12 ENCOUNTER — Ambulatory Visit: Payer: Medicaid Other | Admitting: Orthopedic Surgery

## 2021-03-12 ENCOUNTER — Other Ambulatory Visit: Payer: Self-pay

## 2021-03-12 VITALS — BP 130/64 | HR 67 | Ht 64.0 in | Wt 165.0 lb

## 2021-03-12 DIAGNOSIS — Z01818 Encounter for other preprocedural examination: Secondary | ICD-10-CM

## 2021-03-12 DIAGNOSIS — M24411 Recurrent dislocation, right shoulder: Secondary | ICD-10-CM

## 2021-03-12 DIAGNOSIS — M25311 Other instability, right shoulder: Secondary | ICD-10-CM

## 2021-03-12 NOTE — Progress Notes (Signed)
Orthopaedic Clinic Return ° °Assessment: °Amariona T Artola is a 36 y.o. female with the following: °Chronic right shoulder instability; recurrent dislocation, most recent 1 month ago ° °Plan: °Patient has chronic right shoulder instability.  She has previously been scheduled for surgery on 2 separate occasions.  For multiple reasons she has had to reschedule.  Since I last saw her, she has had multiple dislocations, with the most recent being approximately 1 month ago.  As we previously discussed, she is a candidate for right shoulder arthroscopy, with labrum repair.  Due to the chronicity of her symptoms, she may require both anterior and posterior labral repair.  This will be determined at the time of surgery.  The procedure was discussed at great lengths.  She would like to proceed with surgery soon as possible.  We will plan for surgery in March 21, 2021.  Surgery and recovery were discussed, all questions were answered. ° ° °Follow-up: °Return for After surgery; DOS 03/21/21. ° ° °Subjective: ° °Chief Complaint  °Patient presents with  ° consultation  °  RT shoulder/ discuss surgery °Pt states that it is getting harder to "put shoulder back in" when it dislocates. She is beginning to lose feeling down the right arm into hand  ° ° °History of Present Illness: °Luetta T Hemberger is a 36 y.o. female who returns to clinic for repeat evaluation of her right shoulder.  Been scheduled for surgery previously, including September, 2022.  Due to multiple issues, she had to reschedule.  Since then, she has had at least 4-5 dislocations.  She states she is usually able to get her shoulder relocated, but did have to present to the emergency department approximately 1 month ago.  She states that the most recent dislocation was associated with some numbness, and tingling into her right hand.  This is since improved.  She is very apprehensive with certain motions.  She is aware that she needs to have surgery.  She is reluctant and  anxious overall. ° ° °Review of Systems: °No fevers or chills °No numbness or tingling °No chest pain °No shortness of breath °No bowel or bladder dysfunction °No GI distress °No headaches ° ° °Objective: °BP 130/64    Pulse 67    Ht 5' 4" (1.626 m)    Wt 165 lb (74.8 kg)    LMP 03/05/2021 (Exact Date)    BMI 28.32 kg/m²  ° °Physical Exam: ° °Alert and oriented.  No acute distress. ° °Right shoulder without deformity.  Sensations intact over the axillary nerve distribution.  Positive apprehension.  Positive relocation testing.  External rotation at her side also causes apprehension.  Fingers warm and well-perfused.  Sensation is intact throughout the right hand. ° °IMAGING: °I personally ordered and reviewed the following images: ° °MRI of the right shoulder demonstrates a small, chronic appearing Hill-Sachs lesion.  The anterior and inferior labrum is torn.  The remaining portions of the labrum are diminutive in size. ° °Lenard Kampf A Wendelyn Kiesling, MD °03/12/2021 °11:14 AM ° ° °

## 2021-03-12 NOTE — H&P (View-Only) (Signed)
Orthopaedic Clinic Return  Assessment: [name] is a 36 y.o. female with the following: Chronic right shoulder instability; recurrent dislocation, most recent 1 month ago  Plan: Patient has chronic right shoulder instability.  She has previously been scheduled for surgery on 2 separate occasions.  For multiple reasons she has had to reschedule.  Since I last saw her, she has had multiple dislocations, with the most recent being approximately 1 month ago.  As we previously discussed, she is a candidate for right shoulder arthroscopy, with labrum repair.  Due to the chronicity of her symptoms, she may require both anterior and posterior labral repair.  This will be determined at the time of surgery.  The procedure was discussed at great lengths.  She would like to proceed with surgery soon as possible.  We will plan for surgery in March 21, 2021.  Surgery and recovery were discussed, all questions were answered.   Follow-up: Return for After surgery; DOS 03/21/21.   Subjective:  Chief Complaint  Patient presents with   consultation    RT shoulder/ discuss surgery Pt states that it is getting harder to "put shoulder back in" when it dislocates. She is beginning to lose feeling down the right arm into hand    History of Present Illness: [name] is a 36 y.o. female who returns to clinic for repeat evaluation of her right shoulder.  Been scheduled for surgery previously, including September, 2022.  Due to multiple issues, she had to reschedule.  Since then, she has had at least 4-5 dislocations.  She states she is usually able to get her shoulder relocated, but did have to present to the emergency department approximately 1 month ago.  She states that the most recent dislocation was associated with some numbness, and tingling into her right hand.  This is since improved.  She is very apprehensive with certain motions.  She is aware that she needs to have surgery.  She is reluctant and  anxious overall.   Review of Systems: No fevers or chills No numbness or tingling No chest pain No shortness of breath No bowel or bladder dysfunction No GI distress No headaches   Objective: BP 130/64    Pulse 67    Ht 5\' 4"  (1.626 m)    Wt 165 lb (74.8 kg)    LMP 03/05/2021 (Exact Date)    BMI 28.32 kg/m   Physical Exam:  Alert and oriented.  No acute distress.  Right shoulder without deformity.  Sensations intact over the axillary nerve distribution.  Positive apprehension.  Positive relocation testing.  External rotation at her side also causes apprehension.  Fingers warm and well-perfused.  Sensation is intact throughout the right hand.  IMAGING: I personally ordered and reviewed the following images:  MRI of the right shoulder demonstrates a small, chronic appearing Hill-Sachs lesion.  The anterior and inferior labrum is torn.  The remaining portions of the labrum are diminutive in size.  Tracey Rasmussen, MD 03/12/2021 11:14 AM

## 2021-03-15 NOTE — Patient Instructions (Signed)
[name]  03/15/2021     @PREFPERIOPPHARMACY @   Your procedure is scheduled on  03/21/2021.   Report to 03/23/2021 at  [phone]  A.M.   Call this number if you have problems the morning of surgery:  [phone]   Remember:  Do not eat  after midnight.   You may drink clear liquids until 0330 am.  Clear liquids allowed are:                    Water, Juice (non-citric and without pulp - diabetics please choose diet or no sugar options), Carbonated beverages - (diabetics please choose diet or no sugar options), Clear Tea, Black Coffee only (no creamer, milk or cream including half and half), Plain Jell-O only (diabetics please choose diet or no sugar options), Gatorade (diabetics please choose diet or no sugar options), and Plain Popsicles only    At 0330 am drink your carb drink. You can have nothing else to drink after this.    Take these medicines the morning of surgery with A SIP OF WATER                                          None     Do not wear jewelry, make-up or nail polish.  Do not wear lotions, powders, or perfumes, or deodorant.  Do not shave 48 hours prior to surgery.  Men may shave face and neck.  Do not bring valuables to the hospital.  [facility] is not responsible for any belongings or valuables.  Contacts, dentures or bridgework may not be worn into surgery.  Leave your suitcase in the car.  After surgery it may be brought to your room.  For patients admitted to the hospital, discharge time will be determined by your treatment team.  Patients discharged the day of surgery will not be allowed to drive home and must have someone with them for 24 hours.    Special instructions:   DO NOT smoke tobacco or vape for 24 hours before your procedure.  Please read over the following fact sheets that you were given. Coughing and Deep Breathing, Surgical Site Infection Prevention, Anesthesia Post-op Instructions, and Care and Recovery After  Surgery      Shoulder Arthroscopy, Care After The following information offers guidance on how to care for yourself after your procedure. Your health care provider may also give you more specific instructions. If you have problems or questions, contact your health care provider. What can I expect after the procedure? After the procedure, it is common to have: Pain. Swelling. A small amount of fluid from the incision. Stiffness that improves over time. Follow these instructions at home: If you have a sling or an immobilizer: Wear it as told by your health care provider. Remove it only as told by your health care provider. These devices protect your shoulder and help it heal by keeping it in place. Check the skin around it every day. Tell your health care provider about any concerns. Loosen it if your fingers tingle, become numb, or turn cold and blue. Keep the sling or immobilizer clean. If it is not waterproof: Do not let it get wet. Cover it with a watertight covering when you take a bath or a shower. Incision care  Follow instructions from your health care provider  about how to take care of your incisions. Make sure you: Wash your hands with soap and water for at least 20 seconds before and after you change your bandage (dressing). If soap and water are not available, use hand sanitizer. Change your dressing as told by your health care provider. Leave stitches (sutures), skin glue, or adhesive strips in place. These skin closures may need to stay in place for 2 weeks or longer. If adhesive strip edges start to loosen and curl up, you may trim the loose edges. Do not remove adhesive strips completely unless your health care provider tells you to do that. Check your incision areas every day for signs of infection. Check for: Redness. More swelling or pain. Blood or more fluid. Warmth. Pus or a bad smell. Do not take baths, swim, or use a hot tub until your health care provider  approves. Ask your health care provider if you may take showers. You may only be allowed to take sponge baths. Managing pain, stiffness, and swelling  If directed, put ice on the affected area. To do this: If you have a removable sling or immobilizer, remove it as told by your health care provider. Put ice in a plastic bag or use the icing device (cold therapy unit) that you were given. Follow instructions from your health care provider about how to use the icing device. Place a towel between your skin and the bag or between your skin and the icing device. Leave the ice on for 20 minutes, 2-3 times a day. Remove the ice if your skin turns bright red. This is very important. If you cannot feel pain, heat, or cold, you have a greater risk of damage to the area. Move your fingers often to reduce stiffness and swelling. Raise (elevate) the injured area above the level of your heart while you are lying down. It may help to sleep in a sitting position for a few days after your procedure. Try sleeping in a reclining chair or propping yourself up with extra pillows in bed. Activity Ask your health care provider what activities are safe for you during recovery. Do not lift with your affected shoulder until your health care provider approves. Avoid pulling and pushing with the arm on your affected side. If physical therapy was prescribed, do exercises as directed. Doing exercises may help to improve shoulder movement and flexibility (range of motion). Driving Ask your health care provider when it is safe to drive if you have a sling or immobilizer. Ask your health care provider if the medicine prescribed to you requires you to avoid driving or using machinery. General instructions Take over-the-counter and prescription medicines only as told by your health care provider. Ask your health care provider if the medicine prescribed to you can cause constipation. You may need to take these actions to prevent  or treat constipation: Drink enough fluid to keep your urine pale yellow. Take over-the-counter or prescription medicines. Eat foods that are high in fiber, such as beans, whole grains, and fresh fruits and vegetables. Limit foods that are high in fat and processed sugars, such as fried or sweet foods. Do not use any products that contain nicotine or tobacco. These products include cigarettes, chewing tobacco, and vaping devices, such as e-cigarettes. These can delay incision healing after surgery. If you need help quitting, ask your health care provider. Keep all follow-up visits. This is important. Contact a health care provider if: You have a fever or chills. You have severe pain.  You have any of these signs of infection: Redness around an incision. More swelling or pain in an incision area. Blood or more fluid coming from an incision. Warmth coming from an incision. Pus or a bad smell coming from an incision. You notice that an incision has opened up. You develop a rash. Get help right away if: You have difficulty breathing. You have chest pain. You notice that your fingers tingle, are numb, or are cold and blue even after you loosen your sling or immobilizer. You develop pain in your lower leg or at the back of your knee. These symptoms may represent a serious problem that is an emergency. Do not wait to see if the symptoms will go away. Get medical help right away. Call your local emergency services (911 in the [place] Do not drive yourself to the hospital. Summary If you have a sling or an immobilizer, wear it as told by your health care provider. It may help to sleep in a sitting position for a few days after your procedure. If physical therapy was prescribed, do exercises as directed. Doing exercises may help to improve shoulder movement and flexibility (range of motion). Keep all follow-up visits. This is important. This information is not intended to replace advice given to you  by your health care provider. Make sure you discuss any questions you have with your health care provider. Document Revised: 10/10/2019 Document Reviewed: 10/10/2019 Elsevier Patient Education  2022 [employer] General Anesthesia, Adult, Care After This sheet gives you information about how to care for yourself after your procedure. Your health care provider may also give you more specific instructions. If you have problems or questions, contact your health care provider. What can I expect after the procedure? After the procedure, the following side effects are common: Pain or discomfort at the IV site. Nausea. Vomiting. Sore throat. Trouble concentrating. Feeling cold or chills. Feeling weak or tired. Sleepiness and fatigue. Soreness and body aches. These side effects can affect parts of the body that were not involved in surgery. Follow these instructions at home: For the time period you were told by your health care provider:  Rest. Do not participate in activities where you could fall or become injured. Do not drive or use machinery. Do not drink alcohol. Do not take sleeping pills or medicines that cause drowsiness. Do not make important decisions or sign legal documents. Do not take care of children on your own. Eating and drinking Follow any instructions from your health care provider about eating or drinking restrictions. When you feel hungry, start by eating small amounts of foods that are soft and easy to digest (bland), such as toast. Gradually return to your regular diet. Drink enough fluid to keep your urine pale yellow. If you vomit, rehydrate by drinking water, juice, or clear broth. General instructions If you have sleep apnea, surgery and certain medicines can increase your risk for breathing problems. Follow instructions from your health care provider about wearing your sleep device: Anytime you are sleeping, including during daytime naps. While taking  prescription pain medicines, sleeping medicines, or medicines that make you drowsy. Have a responsible adult stay with you for the time you are told. It is important to have someone help care for you until you are awake and alert. Return to your normal activities as told by your health care provider. Ask your health care provider what activities are safe for you. Take over-the-counter and prescription medicines only as told by your health care  provider. If you smoke, do not smoke without supervision. Keep all follow-up visits as told by your health care provider. This is important. Contact a health care provider if: You have nausea or vomiting that does not get better with medicine. You cannot eat or drink without vomiting. You have pain that does not get better with medicine. You are unable to pass urine. You develop a skin rash. You have a fever. You have redness around your IV site that gets worse. Get help right away if: You have difficulty breathing. You have chest pain. You have blood in your urine or stool, or you vomit blood. Summary After the procedure, it is common to have a sore throat or nausea. It is also common to feel tired. Have a responsible adult stay with you for the time you are told. It is important to have someone help care for you until you are awake and alert. When you feel hungry, start by eating small amounts of foods that are soft and easy to digest (bland), such as toast. Gradually return to your regular diet. Drink enough fluid to keep your urine pale yellow. Return to your normal activities as told by your health care provider. Ask your health care provider what activities are safe for you. This information is not intended to replace advice given to you by your health care provider. Make sure you discuss any questions you have with your health care provider. Document Revised: 10/27/2019 Document Reviewed: 05/26/2019 Elsevier Patient Education  2022 [employer] How to Use Chlorhexidine for Bathing Chlorhexidine gluconate (CHG) is a germ-killing (antiseptic) solution that is used to clean the skin. It can get rid of the bacteria that normally live on the skin and can keep them away for about 24 hours. To clean your skin with CHG, you may be given: A CHG solution to use in the shower or as part of a sponge bath. A prepackaged cloth that contains CHG. Cleaning your skin with CHG may help lower the risk for infection: While you are staying in the intensive care unit of the hospital. If you have a vascular access, such as a central line, to provide short-term or long-term access to your veins. If you have a catheter to drain urine from your bladder. If you are on a ventilator. A ventilator is a machine that helps you breathe by moving air in and out of your lungs. After surgery. What are the risks? Risks of using CHG include: A skin reaction. Hearing loss, if CHG gets in your ears and you have a perforated eardrum. Eye injury, if CHG gets in your eyes and is not rinsed out. The CHG product catching fire. Make sure that you avoid smoking and flames after applying CHG to your skin. Do not use CHG: If you have a chlorhexidine allergy or have previously reacted to chlorhexidine. On babies younger than 622 months of age. How to use CHG solution Use CHG only as told by your health care provider, and follow the instructions on the label. Use the full amount of CHG as directed. Usually, this is one bottle. During a shower Follow these steps when using CHG solution during a shower (unless your health care provider gives you different instructions): Start the shower. Use your normal soap and shampoo to wash your face and hair. Turn off the shower or move out of the shower stream. Pour the CHG onto a clean washcloth. Do not use any type of brush or rough-edged sponge. Starting at  your neck, lather your body down to your toes. Make sure you follow these  instructions: If you will be having surgery, pay special attention to the part of your body where you will be having surgery. Scrub this area for at least 1 minute. Do not use CHG on your head or face. If the solution gets into your ears or eyes, rinse them well with water. Avoid your genital area. Avoid any areas of skin that have broken skin, cuts, or scrapes. Scrub your back and under your arms. Make sure to wash skin folds. Let the lather sit on your skin for 1-2 minutes or as long as told by your health care provider. Thoroughly rinse your entire body in the shower. Make sure that all body creases and crevices are rinsed well. Dry off with a clean towel. Do not put any substances on your body afterward--such as powder, lotion, or perfume--unless you are told to do so by your health care provider. Only use lotions that are recommended by the manufacturer. Put on clean clothes or pajamas. If it is the night before your surgery, sleep in clean sheets.  During a sponge bath Follow these steps when using CHG solution during a sponge bath (unless your health care provider gives you different instructions): Use your normal soap and shampoo to wash your face and hair. Pour the CHG onto a clean washcloth. Starting at your neck, lather your body down to your toes. Make sure you follow these instructions: If you will be having surgery, pay special attention to the part of your body where you will be having surgery. Scrub this area for at least 1 minute. Do not use CHG on your head or face. If the solution gets into your ears or eyes, rinse them well with water. Avoid your genital area. Avoid any areas of skin that have broken skin, cuts, or scrapes. Scrub your back and under your arms. Make sure to wash skin folds. Let the lather sit on your skin for 1-2 minutes or as long as told by your health care provider. Using a different clean, wet washcloth, thoroughly rinse your entire body. Make sure that  all body creases and crevices are rinsed well. Dry off with a clean towel. Do not put any substances on your body afterward--such as powder, lotion, or perfume--unless you are told to do so by your health care provider. Only use lotions that are recommended by the manufacturer. Put on clean clothes or pajamas. If it is the night before your surgery, sleep in clean sheets. How to use CHG prepackaged cloths Only use CHG cloths as told by your health care provider, and follow the instructions on the label. Use the CHG cloth on clean, dry skin. Do not use the CHG cloth on your head or face unless your health care provider tells you to. When washing with the CHG cloth: Avoid your genital area. Avoid any areas of skin that have broken skin, cuts, or scrapes. Before surgery Follow these steps when using a CHG cloth to clean before surgery (unless your health care provider gives you different instructions): Using the CHG cloth, vigorously scrub the part of your body where you will be having surgery. Scrub using a back-and-forth motion for 3 minutes. The area on your body should be completely wet with CHG when you are done scrubbing. Do not rinse. Discard the cloth and let the area air-dry. Do not put any substances on the area afterward, such as powder, lotion, or perfume.  Put on clean clothes or pajamas. If it is the night before your surgery, sleep in clean sheets.  For general bathing Follow these steps when using CHG cloths for general bathing (unless your health care provider gives you different instructions). Use a separate CHG cloth for each area of your body. Make sure you wash between any folds of skin and between your fingers and toes. Wash your body in the following order, switching to a new cloth after each step: The front of your neck, shoulders, and chest. Both of your arms, under your arms, and your hands. Your stomach and groin area, avoiding the genitals. Your right leg and  foot. Your left leg and foot. The back of your neck, your back, and your buttocks. Do not rinse. Discard the cloth and let the area air-dry. Do not put any substances on your body afterward--such as powder, lotion, or perfume--unless you are told to do so by your health care provider. Only use lotions that are recommended by the manufacturer. Put on clean clothes or pajamas. Contact a health care provider if: Your skin gets irritated after scrubbing. You have questions about using your solution or cloth. You swallow any chlorhexidine. Call your local poison control center [phone] in the [place] Get help right away if: Your eyes itch badly, or they become very red or swollen. Your skin itches badly and is red or swollen. Your hearing changes. You have trouble seeing. You have swelling or tingling in your mouth or throat. You have trouble breathing. These symptoms may represent a serious problem that is an emergency. Do not wait to see if the symptoms will go away. Get medical help right away. Call your local emergency services (911 in the [place] Do not drive yourself to the hospital. Summary Chlorhexidine gluconate (CHG) is a germ-killing (antiseptic) solution that is used to clean the skin. Cleaning your skin with CHG may help to lower your risk for infection. You may be given CHG to use for bathing. It may be in a bottle or in a prepackaged cloth to use on your skin. Carefully follow your health care provider's instructions and the instructions on the product label. Do not use CHG if you have a chlorhexidine allergy. Contact your health care provider if your skin gets irritated after scrubbing. This information is not intended to replace advice given to you by your health care provider. Make sure you discuss any questions you have with your health care provider. Document Revised: 04/23/2020 Document Reviewed: 04/23/2020 Elsevier Patient Education  2022 [employer].

## 2021-03-19 ENCOUNTER — Other Ambulatory Visit: Payer: Self-pay | Admitting: Adult Health

## 2021-03-19 ENCOUNTER — Encounter (HOSPITAL_COMMUNITY)
Admission: RE | Admit: 2021-03-19 | Discharge: 2021-03-19 | Disposition: A | Discharge status: Home / Self Care | Payer: Medicaid Other | Source: Ambulatory Visit | Attending: Orthopedic Surgery | Admitting: Orthopedic Surgery

## 2021-03-19 DIAGNOSIS — Z01818 Encounter for other preprocedural examination: Secondary | ICD-10-CM

## 2021-03-19 MED ORDER — TINIDAZOLE 500 MG PO TABS: ORAL_TABLET | ORAL | 0 refills | Status: DC

## 2021-03-19 NOTE — Telephone Encounter (Signed)
Refilled tindamax

## 2021-03-20 ENCOUNTER — Other Ambulatory Visit (HOSPITAL_COMMUNITY)
Admission: RE | Admit: 2021-03-20 | Discharge: 2021-03-20 | Disposition: A | Discharge status: Home / Self Care | Payer: Medicaid Other | Source: Ambulatory Visit | Attending: Obstetrics & Gynecology | Admitting: Obstetrics & Gynecology

## 2021-03-20 ENCOUNTER — Other Ambulatory Visit (INDEPENDENT_AMBULATORY_CARE_PROVIDER_SITE_OTHER): Payer: Medicaid Other | Admitting: *Deleted

## 2021-03-20 ENCOUNTER — Encounter (HOSPITAL_COMMUNITY): Payer: Self-pay

## 2021-03-20 ENCOUNTER — Other Ambulatory Visit: Payer: Self-pay

## 2021-03-20 ENCOUNTER — Encounter (HOSPITAL_COMMUNITY)
Admission: RE | Admit: 2021-03-20 | Discharge: 2021-03-20 | Disposition: A | Discharge status: Home / Self Care | Payer: Medicaid Other | Source: Ambulatory Visit | Attending: Orthopedic Surgery | Admitting: Orthopedic Surgery

## 2021-03-20 DIAGNOSIS — N898 Other specified noninflammatory disorders of vagina: Secondary | ICD-10-CM | POA: Diagnosis not present

## 2021-03-20 DIAGNOSIS — Z01812 Encounter for preprocedural laboratory examination: Secondary | ICD-10-CM | POA: Insufficient documentation

## 2021-03-20 DIAGNOSIS — Z01818 Encounter for other preprocedural examination: Secondary | ICD-10-CM

## 2021-03-20 LAB — BASIC METABOLIC PANEL
Anion gap: 8 (ref 5–15)
BUN: 13 mg/dL (ref 6–20)
CO2: 24 mmol/L (ref 22–32)
Calcium: 8.6 mg/dL — ABNORMAL LOW (ref 8.9–10.3)
Chloride: 104 mmol/L (ref 98–111)
Creatinine, Ser: 0.65 mg/dL (ref 0.44–1.00)
GFR, Estimated: >60 mL/min (ref >60)
Glucose, Bld: 91 mg/dL (ref 70–99)
Potassium: 3.6 mmol/L (ref 3.5–5.1)
Sodium: 136 mmol/L (ref 135–145)

## 2021-03-20 LAB — CBC
HCT: 30.2 % — ABNORMAL LOW (ref 36.0–46.0)
Hemoglobin: 8.2 g/dL — ABNORMAL LOW (ref 12.0–15.0)
MCH: 18 pg — ABNORMAL LOW (ref 26.0–34.0)
MCHC: 27.2 g/dL — ABNORMAL LOW (ref 30.0–36.0)
MCV: 66.4 fL — ABNORMAL LOW (ref 80.0–100.0)
Platelets: 211 10*3/uL (ref 150–400)
RBC: 4.55 MIL/uL (ref 3.87–5.11)
RDW: 19 % — ABNORMAL HIGH (ref 11.5–15.5)
WBC: 4.4 10*3/uL (ref 4.0–10.5)
nRBC: 0 % (ref 0.0–0.2)

## 2021-03-20 LAB — HCG, QUANTITATIVE, PREGNANCY: hCG, Beta Chain, Quant, S: <1 m[IU]/mL (ref <5)

## 2021-03-20 NOTE — Progress Notes (Signed)
° °  NURSE VISIT- VAGINITIS/STD/POC  SUBJECTIVE:  [name] is a 36 y.o. 89 GYN patientfemale here for a vaginal swab for vaginitis screening.  She reports the following symptoms: vulvar itching for 3 days. Denies abnormal vaginal bleeding, significant pelvic pain, fever, or UTI symptoms.  OBJECTIVE:  LMP 03/05/2021 (Exact Date)   Appears well, in no apparent distress  ASSESSMENT: Vaginal swab for vaginitis screening  PLAN: Self-collected vaginal probe for Gonorrhea, Chlamydia, Trichomonas, Bacterial Vaginosis, Yeast sent to lab Treatment: to be determined once results are received Follow-up as needed if symptoms persist/worsen, or new symptoms develop  Annamarie Dawley  03/20/2021 10:21 AM

## 2021-03-21 ENCOUNTER — Other Ambulatory Visit: Payer: Self-pay

## 2021-03-21 ENCOUNTER — Other Ambulatory Visit: Payer: Self-pay | Admitting: Adult Health

## 2021-03-21 ENCOUNTER — Encounter (HOSPITAL_COMMUNITY): Payer: Self-pay | Admitting: Orthopedic Surgery

## 2021-03-21 ENCOUNTER — Ambulatory Visit (HOSPITAL_COMMUNITY): Payer: Medicaid Other | Admitting: Certified Registered Nurse Anesthetist

## 2021-03-21 ENCOUNTER — Ambulatory Visit (HOSPITAL_COMMUNITY)
Admission: RE | Admit: 2021-03-21 | Discharge: 2021-03-21 | Disposition: A | Discharge status: Home / Self Care | Payer: Medicaid Other | Attending: Orthopedic Surgery | Admitting: Orthopedic Surgery

## 2021-03-21 ENCOUNTER — Encounter: Payer: Self-pay | Admitting: Orthopedic Surgery

## 2021-03-21 ENCOUNTER — Encounter (HOSPITAL_COMMUNITY)
Admission: RE | Disposition: A | Discharge status: Home / Self Care | Payer: Self-pay | Source: Home / Self Care | Attending: Orthopedic Surgery

## 2021-03-21 DIAGNOSIS — G8918 Other acute postprocedural pain: Secondary | ICD-10-CM | POA: Diagnosis not present

## 2021-03-21 DIAGNOSIS — M25311 Other instability, right shoulder: Secondary | ICD-10-CM | POA: Insufficient documentation

## 2021-03-21 DIAGNOSIS — Z01818 Encounter for other preprocedural examination: Secondary | ICD-10-CM

## 2021-03-21 DIAGNOSIS — Z87891 Personal history of nicotine dependence: Secondary | ICD-10-CM | POA: Diagnosis not present

## 2021-03-21 HISTORY — PX: SHOULDER ARTHROSCOPY WITH LABRAL REPAIR: SHX5691

## 2021-03-21 LAB — CERVICOVAGINAL ANCILLARY ONLY
Bacterial Vaginitis (gardnerella): POSITIVE — AB
Candida Glabrata: NEGATIVE
Candida Vaginitis: POSITIVE — AB
Chlamydia: NEGATIVE
Comment: NEGATIVE
Comment: NEGATIVE
Comment: NEGATIVE
Comment: NEGATIVE
Comment: NEGATIVE
Comment: NORMAL
Neisseria Gonorrhea: NEGATIVE
Trichomonas: NEGATIVE

## 2021-03-21 LAB — TYPE AND SCREEN
ABO/RH(D): O POS
Antibody Screen: NEGATIVE

## 2021-03-21 SURGERY — ARTHROSCOPY, SHOULDER, WITH GLENOID LABRUM REPAIR
Anesthesia: Regional | Site: Shoulder | Laterality: Right

## 2021-03-21 MED ORDER — ROPIVACAINE HCL 5 MG/ML IJ SOLN
INTRAMUSCULAR | Status: AC
  Filled 2021-03-21: qty 30

## 2021-03-21 MED ORDER — ROCURONIUM BROMIDE 10 MG/ML (PF) SYRINGE
PREFILLED_SYRINGE | INTRAVENOUS | Status: AC
  Filled 2021-03-21: qty 10

## 2021-03-21 MED ORDER — FLUCONAZOLE 150 MG PO TABS: ORAL_TABLET | ORAL | 1 refills | Status: DC

## 2021-03-21 MED ORDER — EPINEPHRINE PF 1 MG/ML IJ SOLN
INTRAMUSCULAR | Status: AC
  Filled 2021-03-21: qty 6

## 2021-03-21 MED ORDER — CEFAZOLIN SODIUM-DEXTROSE 2-4 GM/100ML-% IV SOLN
INTRAVENOUS | Status: AC
  Filled 2021-03-21: qty 100

## 2021-03-21 MED ORDER — PROPOFOL 10 MG/ML IV BOLUS
INTRAVENOUS | Status: AC
  Filled 2021-03-21: qty 20

## 2021-03-21 MED ORDER — MIDAZOLAM HCL 2 MG/2ML IJ SOLN
INTRAMUSCULAR | Status: AC
  Filled 2021-03-21: qty 2

## 2021-03-21 MED ORDER — TINIDAZOLE 500 MG PO TABS: ORAL_TABLET | ORAL | 0 refills | Status: DC

## 2021-03-21 MED ORDER — PHENYLEPHRINE HCL-NACL 20-0.9 MG/250ML-% IV SOLN
INTRAVENOUS | Status: AC
  Filled 2021-03-21: qty 250

## 2021-03-21 MED ORDER — CHLORHEXIDINE GLUCONATE 0.12 % MT SOLN
15.0000 mL | Freq: Once | OROMUCOSAL | Status: AC
  Administered 2021-03-21: 15 mL via OROMUCOSAL

## 2021-03-21 MED ORDER — FENTANYL CITRATE PF 50 MCG/ML IJ SOSY: 25.0000 ug | PREFILLED_SYRINGE | INTRAMUSCULAR | Status: DC | PRN

## 2021-03-21 MED ORDER — ACETAMINOPHEN 500 MG PO TABS: 1000.0000 mg | ORAL_TABLET | Freq: Three times a day (TID) | ORAL | 0 refills | Status: AC

## 2021-03-21 MED ORDER — ROPIVACAINE HCL 5 MG/ML IJ SOLN
INTRAMUSCULAR | Status: DC | PRN
Start: 2021-03-21 — End: 2021-03-21
  Administered 2021-03-21: 30 mL via PERINEURAL

## 2021-03-21 MED ORDER — ROCURONIUM BROMIDE 10 MG/ML (PF) SYRINGE
PREFILLED_SYRINGE | INTRAVENOUS | Status: DC | PRN
  Administered 2021-03-21: 70 mg via INTRAVENOUS

## 2021-03-21 MED ORDER — OXYCODONE HCL 5 MG PO TABS: 5.0000 mg | ORAL_TABLET | ORAL | 0 refills | Status: AC | PRN

## 2021-03-21 MED ORDER — PROMETHAZINE HCL 25 MG/ML IJ SOLN
12.5000 mg | Freq: Once | INTRAMUSCULAR | Status: AC
  Administered 2021-03-21: 12.5 mg via INTRAVENOUS

## 2021-03-21 MED ORDER — ONDANSETRON HCL 4 MG/2ML IJ SOLN
4.0000 mg | Freq: Once | INTRAMUSCULAR | Status: AC | PRN
  Administered 2021-03-21: 4 mg via INTRAVENOUS
  Filled 2021-03-21: qty 2

## 2021-03-21 MED ORDER — EPHEDRINE SULFATE (PRESSORS) 50 MG/ML IJ SOLN
INTRAMUSCULAR | Status: DC | PRN
  Administered 2021-03-21 (×3): 5 mg via INTRAVENOUS

## 2021-03-21 MED ORDER — EPINEPHRINE PF 1 MG/ML IJ SOLN
INTRAMUSCULAR | Status: AC
  Filled 2021-03-21: qty 8

## 2021-03-21 MED ORDER — ONDANSETRON HCL 4 MG/2ML IJ SOLN
INTRAMUSCULAR | Status: AC
  Filled 2021-03-21: qty 2

## 2021-03-21 MED ORDER — EPHEDRINE 5 MG/ML INJ
INTRAVENOUS | Status: AC
  Filled 2021-03-21: qty 5

## 2021-03-21 MED ORDER — PROPOFOL 10 MG/ML IV BOLUS
INTRAVENOUS | Status: DC | PRN
Start: 2021-03-21 — End: 2021-03-21
  Administered 2021-03-21: 180 mg via INTRAVENOUS

## 2021-03-21 MED ORDER — FENTANYL CITRATE (PF) 100 MCG/2ML IJ SOLN
INTRAMUSCULAR | Status: DC | PRN
Start: 2021-03-21 — End: 2021-03-21
  Administered 2021-03-21: 100 ug via INTRAVENOUS

## 2021-03-21 MED ORDER — SODIUM CHLORIDE 0.9 % IR SOLN
Status: DC | PRN
  Administered 2021-03-21: 1000 mL

## 2021-03-21 MED ORDER — MELOXICAM 15 MG PO TABS: 15.0000 mg | ORAL_TABLET | Freq: Every day | ORAL | 0 refills | Status: AC

## 2021-03-21 MED ORDER — MIDAZOLAM HCL 5 MG/5ML IJ SOLN
INTRAMUSCULAR | Status: DC | PRN
  Administered 2021-03-21: 2 mg via INTRAVENOUS

## 2021-03-21 MED ORDER — BUPIVACAINE-EPINEPHRINE (PF) 0.5% -1:200000 IJ SOLN
INTRAMUSCULAR | Status: AC
  Filled 2021-03-21: qty 30

## 2021-03-21 MED ORDER — EPHEDRINE SULFATE (PRESSORS) 50 MG/ML IJ SOLN: INTRAMUSCULAR | Status: DC | PRN

## 2021-03-21 MED ORDER — ONDANSETRON HCL 4 MG PO TABS: 4.0000 mg | ORAL_TABLET | Freq: Three times a day (TID) | ORAL | 0 refills | Status: AC | PRN

## 2021-03-21 MED ORDER — CHLORHEXIDINE GLUCONATE 0.12 % MT SOLN
OROMUCOSAL | Status: AC
  Filled 2021-03-21: qty 15

## 2021-03-21 MED ORDER — LACTATED RINGERS IV SOLN
INTRAVENOUS | Status: DC
  Administered 2021-03-21: 1000 mL via INTRAVENOUS

## 2021-03-21 MED ORDER — DEXAMETHASONE SODIUM PHOSPHATE 10 MG/ML IJ SOLN
INTRAMUSCULAR | Status: AC
  Filled 2021-03-21: qty 1

## 2021-03-21 MED ORDER — ASPIRIN EC 81 MG PO TBEC: 81.0000 mg | DELAYED_RELEASE_TABLET | Freq: Two times a day (BID) | ORAL | 0 refills | Status: AC

## 2021-03-21 MED ORDER — FENTANYL CITRATE (PF) 100 MCG/2ML IJ SOLN
INTRAMUSCULAR | Status: AC
  Filled 2021-03-21: qty 2

## 2021-03-21 MED ORDER — SUGAMMADEX SODIUM 200 MG/2ML IV SOLN
INTRAVENOUS | Status: DC | PRN
  Administered 2021-03-21: 295.6 mg via INTRAVENOUS

## 2021-03-21 MED ORDER — ORAL CARE MOUTH RINSE: 15.0000 mL | Freq: Once | OROMUCOSAL | Status: AC

## 2021-03-21 MED ORDER — SODIUM CHLORIDE 0.9 % IR SOLN
Status: DC | PRN
  Administered 2021-03-21 (×8): 3000 mL

## 2021-03-21 MED ORDER — DEXAMETHASONE SODIUM PHOSPHATE 10 MG/ML IJ SOLN
INTRAMUSCULAR | Status: DC | PRN
Start: 2021-03-21 — End: 2021-03-21
  Administered 2021-03-21: 10 mg via INTRAVENOUS

## 2021-03-21 MED ORDER — CEFAZOLIN SODIUM-DEXTROSE 2-4 GM/100ML-% IV SOLN
2.0000 g | INTRAVENOUS | Status: AC
  Administered 2021-03-21: 2 g via INTRAVENOUS

## 2021-03-21 MED ORDER — PROMETHAZINE HCL 25 MG/ML IJ SOLN
INTRAMUSCULAR | Status: AC
  Filled 2021-03-21: qty 1

## 2021-03-21 MED ORDER — SUCCINYLCHOLINE CHLORIDE 200 MG/10ML IV SOSY
PREFILLED_SYRINGE | INTRAVENOUS | Status: AC
  Filled 2021-03-21: qty 10

## 2021-03-21 MED ORDER — ONDANSETRON HCL 4 MG/2ML IJ SOLN
INTRAMUSCULAR | Status: DC | PRN
  Administered 2021-03-21: 4 mg via INTRAVENOUS

## 2021-03-21 SURGICAL SUPPLY — 56 items
ANCHOR SUT 1.8 FBRTK KNTLS 2SU (Anchor) ×5 IMPLANT
APL PRP STRL LF DISP 70% ISPRP (MISCELLANEOUS) ×2
BLADE SURG SZ11 CARB STEEL (BLADE) ×2 IMPLANT
CANNULA 5.75X71 LONG (CANNULA) ×1 IMPLANT
CANNULA DRILOCK 6.5X75 (CANNULA) ×4 IMPLANT
CANNULA TWIST IN 8.25X7CM (CANNULA) ×2 IMPLANT
CHLORAPREP W/TINT 26 (MISCELLANEOUS) ×3 IMPLANT
CLOTH BEACON ORANGE TIMEOUT ST (SAFETY) ×2 IMPLANT
COOLER ICEMAN CLASSIC (MISCELLANEOUS) ×1 IMPLANT
COVER LIGHT HANDLE STERIS (MISCELLANEOUS) ×4 IMPLANT
CUTTER BONE 4.0MM X 13CM (MISCELLANEOUS) ×1 IMPLANT
DRAPE HALF SHEET 40X57 (DRAPES) ×2 IMPLANT
DRAPE ORTHO 2.5IN SPLIT 77X108 (DRAPES) IMPLANT
DRAPE ORTHO SPLIT 77X108 STRL (DRAPES) ×4
ELECT REM PT RETURN 9FT ADLT (ELECTROSURGICAL) ×2
ELECTRODE REM PT RTRN 9FT ADLT (ELECTROSURGICAL) ×1 IMPLANT
GAUZE 4X4 16PLY ~~LOC~~+RFID DBL (SPONGE) ×1 IMPLANT
GAUZE SPONGE 4X4 12PLY STRL (GAUZE/BANDAGES/DRESSINGS) ×2 IMPLANT
GAUZE XEROFORM 5X9 LF (GAUZE/BANDAGES/DRESSINGS) ×2 IMPLANT
GLOVE SRG 8 PF TXTR STRL LF DI (GLOVE) ×1 IMPLANT
GLOVE SURG POLYISO LF SZ8 (GLOVE) ×2 IMPLANT
GLOVE SURG UNDER POLY LF SZ7 (GLOVE) ×6 IMPLANT
GLOVE SURG UNDER POLY LF SZ8 (GLOVE) ×2
GOWN STRL REUS W/ TWL XL LVL3 (GOWN DISPOSABLE) ×1 IMPLANT
GOWN STRL REUS W/TWL LRG LVL3 (GOWN DISPOSABLE) ×4 IMPLANT
GOWN STRL REUS W/TWL XL LVL3 (GOWN DISPOSABLE) ×2
IV NS IRRIG 3000ML ARTHROMATIC (IV SOLUTION) ×17 IMPLANT
KIT BLADEGUARD II DBL (SET/KITS/TRAYS/PACK) ×2 IMPLANT
KIT PERC INSERT 3.0 KNTLS (KITS) ×1 IMPLANT
KIT STR SPEAR 1.8 FBRTK DISP (KITS) ×1 IMPLANT
KIT TURNOVER KIT A (KITS) ×2 IMPLANT
LASSO 90 CVE QUICKPAS (DISPOSABLE) ×1 IMPLANT
MANIFOLD NEPTUNE II (INSTRUMENTS) ×2 IMPLANT
MARKER SKIN DUAL TIP RULER LAB (MISCELLANEOUS) ×2 IMPLANT
NDL SPNL 18GX3.5 QUINCKE PK (NEEDLE) ×1 IMPLANT
NEEDLE SPNL 18GX3.5 QUINCKE PK (NEEDLE) ×2 IMPLANT
NS IRRIG 1000ML POUR BTL (IV SOLUTION) ×1 IMPLANT
PACK TOTAL JOINT (CUSTOM PROCEDURE TRAY) ×2 IMPLANT
PAD ABD 5X9 TENDERSORB (GAUZE/BANDAGES/DRESSINGS) ×8 IMPLANT
PAD ARMBOARD 7.5X6 YLW CONV (MISCELLANEOUS) ×2 IMPLANT
PAD COLD SHLDR WRAP-ON (PAD) ×1 IMPLANT
PORT APPOLLO RF 90DEGREE MULTI (SURGICAL WAND) ×1 IMPLANT
SET ARTHROSCOPY INST (INSTRUMENTS) ×2 IMPLANT
SET BASIN LINEN APH (SET/KITS/TRAYS/PACK) ×2 IMPLANT
SET SHOULDER TRAC (MISCELLANEOUS) ×1 IMPLANT
SET SHOULDER TRACTION (MISCELLANEOUS) ×2
SLEEVE ARM SUSPENSION SYSTEM (MISCELLANEOUS) ×1 IMPLANT
SLING ARM IMMOBILIZER LRG (SOFTGOODS) ×1 IMPLANT
SLING S3 LATERAL DISP (MISCELLANEOUS) ×1 IMPLANT
SUT ETHILON 3 0 FSL (SUTURE) ×2 IMPLANT
SYR 10ML LL (SYRINGE) ×1 IMPLANT
SYR BULB IRRIG 60ML STRL (SYRINGE) ×1 IMPLANT
TAPE CLOTH SURG 4X10 WHT LF (GAUZE/BANDAGES/DRESSINGS) ×2 IMPLANT
TOWEL OR 17X26 4PK STRL BLUE (TOWEL DISPOSABLE) ×2 IMPLANT
TUBE CONNECTING 12X1/4 (SUCTIONS) ×2 IMPLANT
TUBING IN/OUT FLOW W/MAIN PUMP (TUBING) ×2 IMPLANT

## 2021-03-21 NOTE — Discharge Instructions (Addendum)
Mark A. Amedeo Kinsman, MD [facility] [address] [place],  [place]  [address] Phone: [phone] Fax: [phone]    POST-OPERATIVE INSTRUCTIONS - SHOULDER ARTHROSCOPY  WOUND CARE You may remove the Operative Dressing on Post-Op Day #3 (72hrs after surgery).   Alternatively if you would like you can leave dressing on until follow-up if within 7-8 days but keep it dry. Leave steri-strips in place until they fall off on their own, usually 2 weeks postop. There may be a small amount of fluid/bleeding leaking at the surgical site. This is normal; the shoulder is filled with fluid during the procedure and can leak for 24-48hrs after surgery. You may change/reinforce the bandage as needed.  Use the Cryocuff or Ice as often as possible for the first 7 days, then as needed for pain relief. Always keep a towel, ACE wrap or other barrier between the cooling unit and your skin.  You may shower on Post-Op Day #3. Gently pat the area dry. Do not soak the shoulder in water or submerge it. Keep dry incisions as dry as possible. Do not go swimming in the pool or ocean until 4 weeks after surgery or when otherwise instructed.    EXERCISES/BRACING Sling should be used at all times until follow-up.  You can remove sling for hygiene.    Please continue to ambulate and do not stay sitting or lying for too long. Perform foot and wrist pumps to assist in circulation.  POST-OP MEDICATIONS- Multimodal approach to pain control In general your pain will be controlled with a combination of substances.  Prescriptions unless otherwise discussed are electronically sent to your pharmacy.  This is a carefully made plan we use to minimize narcotic use.    Celebrex - Anti-inflammatory medication taken on a scheduled basis Acetaminophen - Non-narcotic pain medicine taken on a scheduled basis  Oxycodone - This is a strong narcotic, to be used only on an as needed basis for pain. Aspirin  81mg  - This medicine is used to minimize the risk of blood clots after surgery Zofran - take as needed for nausea   FOLLOW-UP If you develop a Fever (?101.5), Redness or Drainage from the surgical incision site, please call our office to arrange for an evaluation. Please call the office to schedule a follow-up appointment for your suture removal, 10-14 days post-operatively.    HELPFUL INFORMATION  If you had a block, it will wear off between 8-24 hrs postop typically.  This is period when your pain may go from nearly zero to the pain you would have had postop without the block.  This is an abrupt transition but nothing dangerous is happening.  You may take an extra dose of narcotic when this happens.  You may be more comfortable sleeping in a semi-seated position the first few nights following surgery.  Keep a pillow propped under the elbow and forearm for comfort.  If you have a recliner type of chair it might be beneficial.  If not that is fine too, but it would be helpful to sleep propped up with pillows behind your operated shoulder as well under your elbow and forearm.  This will reduce pulling on the suture lines.  When dressing, put your operative arm in the sleeve first.  When getting undressed, take your operative arm out last.  Loose fitting, button-down shirts are recommended.  Often in the first days after surgery you may be more comfortable keeping your operative arm under your shirt  and not through the sleeve.  You may return to work/school in the next couple of days when you feel up to it.  Desk work and typing in the sling is     fine.  We suggest you use the pain medication the first night prior to going to bed, in order to ease any pain when the anesthesia wears off. You should avoid taking pain medications on an empty stomach as it will make you nauseous.  You should wean off your narcotic medicines as soon as you are able.  Most patients will be off or using minimal  narcotics before their first postop appointment.   Do not drink alcoholic beverages or take illicit drugs when taking pain medications.  It is against the law to drive while taking narcotics.  In some states it is against the law to drive while your arm is in a sling.   Pain medication may make you constipated.  Below are a few solutions to try in this order: Decrease the amount of pain medication if you aren't having pain. Drink lots of decaffeinated fluids. Drink prune juice and/or eat dried prunes  If the first 3 don't work start with additional solutions Take Colace - an over-the-counter stool softener Take Senokot - an over-the-counter laxative Take Miralax - a stronger over-the-counter laxative   Please call office to make folllow up appointment in 10 days. Office is currently closed for lunch

## 2021-03-21 NOTE — Brief Op Note (Signed)
03/21/2021  11:18 AM  PATIENT:  [name]  36 y.o. female  PRE-OPERATIVE DIAGNOSIS:  Right shoulder instability  POST-OPERATIVE DIAGNOSIS:  Right shoulder instability  PROCEDURE:  Procedure(s): SHOULDER ARTHROSCOPY WITH LABRAL REPAIR (Right)  SURGEON:  Surgeon(s) and Role:    Oliver Barre, MD - Primary  PHYSICIAN ASSISTANT:  None  ASSISTANTS: Cecile Sheerer   ANESTHESIA:   regional and general  EBL:  50 mL   BLOOD ADMINISTERED:none  DRAINS: none   LOCAL MEDICATIONS USED:  NONE  SPECIMEN:  No Specimen  DISPOSITION OF SPECIMEN:  N/A  COUNTS:  YES  TOURNIQUET:  * No tourniquets in log *  DICTATION: .Note written in EPIC  PLAN OF CARE: Discharge to home after PACU  PATIENT DISPOSITION:  PACU - hemodynamically stable.   Delay start of Pharmacological VTE agent (>24hrs) due to surgical blood loss or risk of bleeding: no

## 2021-03-21 NOTE — Op Note (Signed)
Orthopaedic Surgery Operative Note (CSN: [id number])  [name]  [birth year] Date of Surgery: 03/21/2021   Diagnoses:  Right shoulder instability  Procedure: Arthroscopic labral repair and capsulorrhaphy   Operative Finding Exam under anesthesia: Anterior and inferior dislocation with traction and external rotation.  Easily relocates Articular space: No loose bodies.  Patulous anterior capsule.  Anterior labrum diminutive, torn and scarred to the front of the glenoid.  Labrum tissue was friable.  Posterior labrum diminutive, no tear, capsule intact.  Chondral surfaces: Intact, no sign of chondral degeneration on the glenoid or humeral head.  Relatively small posterolateral Hill-Sachs lesion, which was no engaging Biceps: Intact Subscapularis: Intact Superior Cuff: Intact  Successful completion of the planned procedure.  Anterior capsulorraphy with improved position of the anterior labrum.  Limited capsulorraphy of the posterior and inferior labrum    Post-Op Diagnosis: Same Surgeons:Primary: Mordecai Rasmussen, MD Assistants: Cherly Anderson Location: [facility] OR ROOM 4 Anesthesia: General with interscalene block Antibiotics: Ancef 2 g Tourniquet time: None Estimated Blood Loss: Minimal Complications: None Specimens: None  Implants: Implant Name Type Inv. Item Serial No. Manufacturer Lot No. LRB No. Used Action  ANCHOR SUT 1.8 FBRTK KNTLS 2SU - [id number] Anchor ANCHOR SUT 1.8 FBRTK KNTLS 2SU  [employer] [id number] Right 1 Implanted  ANCHOR SUT 1.8 FBRTK KNTLS 2SU - [id number] Anchor ANCHOR SUT 1.8 FBRTK KNTLS 2SU  [employer] [id number] Right 1 Implanted  ANCHOR SUT 1.8 FBRTK KNTLS 2SU - [id number] Anchor ANCHOR SUT 1.8 FBRTK KNTLS 2SU  [employer] [id number] Right 1 Implanted  ANCHOR SUT 1.8 FBRTK KNTLS 2SU - [id number] Anchor ANCHOR SUT 1.8 FBRTK KNTLS 2SU  [employer] [id number] Right 1 Implanted  ANCHOR SUT 1.8 FBRTK KNTLS 2SU - [id number] Anchor ANCHOR SUT 1.8 FBRTK [employer]  [id number] Right 1 Implanted    Indications for Surgery:   [name] is a 36 y.o. female with continued shoulder instability refractory to nonoperative measures for extended period of time.  She sustained her first right shoulder dislocation over 10 years ago.  She was scheduled to undergo arthroscopic stabilization, in 2014, but this was canceled due to insurance issues.  She has had many dislocations in the interim.  She was previously on the schedule in September, and since then she has had multiple dislocations.  The most recent dislocation was a little over a month ago.  This required sedation and reduction in the emergency department.  The risks and benefits were explained at length including but not limited to continued pain, recurrent instability, infection, bleeding stiffness, need for further surgery and more severe complications associated with anesthesia.  She elected to proceed.  Consent was finalized.   Procedure:   Patient was correctly identified in the preoperative holding area and operative site marked.  Patient was brought to OR and positioned lateral on a beanbag ensuring that all bony prominences were padded and the head was in an appropriate location.  Anesthesia was induced and the operative shoulder was prepped and draped in the usual sterile fashion.  Timeout was called preincision.  The shoulder positioner was then placed.  The arm was attached appropriately.  15 pounds of weight was used to provide sufficient traction.  Next, a standard posterior viewing portal was made after localizing the portal with a spinal needle.  After clearing the articular space the diagnostic arthroscopy was completed.  Please refer to the findings above.  We noted a posterior lateral Hill-Sachs lesion, which is chronic appearing.  This was not engaging, we did not feel as though this needed to be addressed.  We then placed 2 cannulas within the interval, using an outside in technique with a spinal  needle.  These were positioned appropriately.  The glenoid was identified, and the anterior capsule was noted to be patulous.  The anterior and inferior labrum was diminutive.  It was scarred down to the front of the glenoid.  There is very little mobility.  We used a labral knife, and we are able to free up some of the labrum.  Unfortunately, the labrum itself was very friable.  A small portion of the labrum subsequently was dislodged.  This was debrided with a shaver.  Using percutaneous technique, we started by placing a single anchor in the 6 o'clock position.  We then used a lasso to capture the anterior and inferior capsular tissue, and placed this through the base of the labrum.  A knotless fiber tack anchor was used.  This was passed through the tissue, and we were able to tighten this portion of the labrum.  This was secured into place, and the knot was cut.  We then turned our attention to the posterior labrum.  It was noted to be diminutive, without obvious tearing.  We felt that this could contribute to the overall stability.  As a result, we placed a single anchor at the approximate 8 o'clock position.  The lasso was introduced, and we completed a capsulorrhaphy, capture of the labral tissue as well.  The suture was passed through the tissue, and tensioned into place.  The suture was subsequently cut.  We then turned our attention back to the anterior glenoid.  We proceeded to place 3 additional anchors marching up the anterior aspect of the glenoid.  We are able to capture the anterior capsule, as well as the remaining labral tissue.  This was then tensioned sequentially.  We are very pleased with the compliance of the capsular tissue, as well as the recreation of the anterior glenoid labrum.  Altogether, we used 1 anchor in the back, followed by 4 anchors of the anterior glenoid starting at 6 o'clock position and marching up to the 2 o'clock position.  There was no obvious bone loss, although  given the chronicity of her instability, number of dislocations it is possible that she has had some chronic wear at approximately 3 o'clock.  Capsular tissue was tensioned in this area to provide greater stability.  Some synovitis was debrided using combination of the shaver and electrocautery.  Attention was released from the arm positioner, and the shoulder was located.  The humeral head was well positioned within the center of the glenoid.  The incisions were closed with 3-0 nylon.  A sterile dressing was placed along with a sling. The patient was awoken from general anesthesia and taken to the PACU in stable condition without complication.    Post-operative plan:  The patient will be non-weightbearing in a sling. The patient will be discharged home.   DVT prophylaxis with 81 mg of aspirin twice daily, until she is fully ambulatory.   Pain control with PRN pain medication preferring oral medicines.   Follow up plan will be scheduled in approximately 10 days for incision check and XR.

## 2021-03-21 NOTE — Anesthesia Procedure Notes (Signed)
Anesthesia Regional Block: Interscalene brachial plexus block   Pre-Anesthetic Checklist: , timeout performed,  Correct Patient, Correct Site, Correct Laterality,  Correct Procedure, Correct Position, site marked,  Risks and benefits discussed,  Surgical consent,  Pre-op evaluation,  At surgeon's request and post-op pain management  Laterality: Right  Prep: Maximum Sterile Barrier Precautions used, chloraprep       Needles:   Needle Type: Stimulator Needle - 80     Needle Length: 9cm  Needle Gauge: 22     Additional Needles:   Procedures:,,,, ultrasound used (permanent image in chart),,     Nerve Stimulator or Paresthesia:  Response: Thenar twitch, 0.4 mA, 2 cm  Additional Responses:   Narrative:  Start time: 03/21/2021 7:12 AM End time: 03/21/2021 7:14 AM Injection made incrementally with aspirations every 5 mL.  Performed by: With CRNAs  Anesthesiologist: Louann Sjogren, MD

## 2021-03-21 NOTE — Transfer of Care (Signed)
Immediate Anesthesia Transfer of Care Note  Patient: [name]  Procedure(s) Performed: SHOULDER ARTHROSCOPY WITH LABRAL REPAIR (Right: Shoulder)  Patient Location: PACU  Anesthesia Type:General and Regional  Level of Consciousness: awake, drowsy and patient cooperative  Airway & Oxygen Therapy: Patient Spontanous Breathing and Patient connected to nasal cannula oxygen  Post-op Assessment: Report given to RN, Post -op Vital signs reviewed and stable and Patient moving all extremities  Post vital signs: Reviewed and stable  Last Vitals:  Vitals Value Taken Time  BP 137/70 03/21/21 1100  Temp    Pulse 85 03/21/21 1102  Resp 25 03/21/21 1102  SpO2 100 % 03/21/21 1102  Vitals shown include unvalidated device data.  Last Pain:  Vitals:   03/21/21 0709  TempSrc: Oral  PainSc: 0-No pain      Patients Stated Pain Goal: 7 (03/21/21 0709)  Complications: No notable events documented.

## 2021-03-21 NOTE — Progress Notes (Signed)
+  BV and yeast on vaginal swab will rx tindamax and diflucan

## 2021-03-21 NOTE — Anesthesia Procedure Notes (Signed)
Procedure Name: Intubation Date/Time: 03/21/2021 7:41 AM Performed by: Annamary Carolin, CRNA Pre-anesthesia Checklist: Patient identified, Emergency Drugs available, Suction available and Patient being monitored Patient Re-evaluated:Patient Re-evaluated prior to induction Oxygen Delivery Method: Circle System Utilized Preoxygenation: Pre-oxygenation with 100% oxygen Induction Type: IV induction Ventilation: Mask ventilation without difficulty Laryngoscope Size: Mac and 3 Grade View: Grade I Tube type: Oral Tube size: 7.0 mm Number of attempts: 1 Placement Confirmation: ETT inserted through vocal cords under direct vision, positive ETCO2 and breath sounds checked- equal and bilateral Tube secured with: Tape Dental Injury: Teeth and Oropharynx as per pre-operative assessment  Comments: With ease, x1.

## 2021-03-21 NOTE — Anesthesia Preprocedure Evaluation (Signed)
Anesthesia Evaluation  Patient identified by MRN, date of birth, ID band Patient awake    Reviewed: Allergy & Precautions, H&P , NPO status , Patient's Chart, lab work & pertinent test results, reviewed documented beta blocker date and time   Airway Mallampati: II  TM Distance: >3 FB Neck ROM: full    Dental no notable dental hx.    Pulmonary neg pulmonary ROS, former smoker,    Pulmonary exam normal breath sounds clear to auscultation       Cardiovascular Exercise Tolerance: Good negative cardio ROS   Rhythm:regular Rate:Normal     Neuro/Psych negative neurological ROS  negative psych ROS   GI/Hepatic negative GI ROS, Neg liver ROS,   Endo/Other  negative endocrine ROS  Renal/GU negative Renal ROS  negative genitourinary   Musculoskeletal   Abdominal   Peds  Hematology  (+) Blood dyscrasia, anemia ,   Anesthesia Other Findings   Reproductive/Obstetrics negative OB ROS                             Anesthesia Physical Anesthesia Plan  ASA: 2  Anesthesia Plan: General and General ETT   Post-op Pain Management: Regional block   Induction: Intravenous  PONV Risk Score and Plan: 3 and Ondansetron  Airway Management Planned:   Additional Equipment:   Intra-op Plan:   Post-operative Plan: Extubation in OR  Informed Consent: I have reviewed the patients History and Physical, chart, labs and discussed the procedure including the risks, benefits and alternatives for the proposed anesthesia with the patient or authorized representative who has indicated his/her understanding and acceptance.     Dental Advisory Given  Plan Discussed with: CRNA  Anesthesia Plan Comments:         Anesthesia Quick Evaluation

## 2021-03-21 NOTE — Interval H&P Note (Signed)
History and Physical Interval Note:  03/21/2021 7:15 AM  [name]  has presented today for surgery, with the diagnosis of Right shoulder instability.  The various methods of treatment have been discussed with the patient and family. After consideration of risks, benefits and other options for treatment, the patient has consented to  Procedure(s): SHOULDER ARTHROSCOPY WITH LABRAL REPAIR (Right) as a surgical intervention.  The patient's history has been reviewed, patient examined, no change in status, stable for surgery.  I have reviewed the patient's chart and labs.  Questions were answered to the patient's satisfaction.     Mordecai Rasmussen

## 2021-03-22 ENCOUNTER — Encounter (HOSPITAL_COMMUNITY): Payer: Self-pay | Admitting: Orthopedic Surgery

## 2021-03-22 NOTE — Anesthesia Postprocedure Evaluation (Signed)
Anesthesia Post Note  Patient: [name]  Procedure(s) Performed: SHOULDER ARTHROSCOPY WITH LABRAL REPAIR (Right: Shoulder)  Patient location during evaluation: Phase II Anesthesia Type: Regional Level of consciousness: awake Pain management: pain level controlled Vital Signs Assessment: post-procedure vital signs reviewed and stable Respiratory status: spontaneous breathing and respiratory function stable Cardiovascular status: blood pressure returned to baseline and stable Postop Assessment: no headache and no apparent nausea or vomiting Anesthetic complications: no Comments: Late entry   No notable events documented.   Last Vitals:  Vitals:   03/21/21 1230 03/21/21 1242  BP: 110/62 107/60  Pulse: 97 70  Resp: (!) 22 20  Temp:  36.5 C  SpO2: 100% 100%    Last Pain:  Vitals:   03/21/21 1242  TempSrc: Oral  PainSc: 0-No pain                 Windell Norfolk

## 2021-04-02 ENCOUNTER — Ambulatory Visit: Payer: Medicaid Other

## 2021-04-02 ENCOUNTER — Ambulatory Visit (INDEPENDENT_AMBULATORY_CARE_PROVIDER_SITE_OTHER): Payer: Medicaid Other | Admitting: Orthopedic Surgery

## 2021-04-02 ENCOUNTER — Other Ambulatory Visit: Payer: Self-pay

## 2021-04-02 ENCOUNTER — Encounter: Payer: Self-pay | Admitting: Orthopedic Surgery

## 2021-04-02 VITALS — Ht 64.0 in | Wt 163.0 lb

## 2021-04-02 DIAGNOSIS — M24411 Recurrent dislocation, right shoulder: Secondary | ICD-10-CM

## 2021-04-03 ENCOUNTER — Encounter: Payer: Self-pay | Admitting: Orthopedic Surgery

## 2021-04-03 NOTE — Progress Notes (Signed)
Orthopaedic Postop Note  Assessment: [name] is a 36 y.o. female s/p right shoulder anterior stabilization for chronic instability  DOS: 03/21/2021  Plan: Sutures were removed, Steri-Strips were placed. Arthroscopic images were reviewed with the patient Patient is to remain in the sling for the next 2 weeks. Referral placed for therapy to begin.  Protocol has been provided. Medications as needed. Follow-up in approximately 1 month.  Follow-up: Return in about 4 weeks (around 04/30/2021). XR at next visit: None  Subjective:  Chief Complaint  Patient presents with   Routine Post Op    Rt shoulder DOS 03/21/21    History of Present Illness: [name] is a 36 y.o. female who presents following the above stated procedure.  Surgery was approximately 2 weeks ago.  She has done well since surgery.  She only required narcotic pain medications for a couple of days.  She has remained in the sling.  She denies numbness and tingling.  No issues with her surgical incisions.  Review of Systems: No fevers or chills No numbness or tingling No Chest Pain No shortness of breath   Objective: Ht 5\' 4"  (1.626 m)    Wt 163 lb (73.9 kg)    LMP 03/05/2021 (Exact Date) Comment: HCG Negative on 03/20/2021   BMI 27.98 kg/m   Physical Exam:  Alert and oriented.  No acute distress.  Evaluation of the right shoulder demonstrates well-healing surgical incisions.  No surrounding erythema or drainage.  Minimal bruising is appreciated.  Sensation is intact in the axillary nerve distribution.  Sensation is intact throughout the right hand.  2+ radial pulse.  Range of motion has been deferred.  IMAGING: I personally ordered and reviewed the following images:  X-rays of the right shoulder were obtained in clinic today.  No evidence of an acute injury.  The glenohumeral joint remains reduced.  No proximal humeral migration.  Impression: Negative right shoulder x-ray   03/22/2021,  MD 04/03/2021 8:19 AM

## 2021-04-04 NOTE — Telephone Encounter (Signed)
done

## 2021-04-05 ENCOUNTER — Ambulatory Visit (HOSPITAL_COMMUNITY): Payer: Medicaid Other | Attending: Orthopedic Surgery | Admitting: Occupational Therapy

## 2021-04-05 ENCOUNTER — Telehealth (HOSPITAL_COMMUNITY): Payer: Self-pay | Admitting: Occupational Therapy

## 2021-04-05 DIAGNOSIS — M25511 Pain in right shoulder: Secondary | ICD-10-CM | POA: Insufficient documentation

## 2021-04-05 DIAGNOSIS — M25611 Stiffness of right shoulder, not elsewhere classified: Secondary | ICD-10-CM | POA: Insufficient documentation

## 2021-04-05 DIAGNOSIS — R29898 Other symptoms and signs involving the musculoskeletal system: Secondary | ICD-10-CM | POA: Insufficient documentation

## 2021-04-05 DIAGNOSIS — M25621 Stiffness of right elbow, not elsewhere classified: Secondary | ICD-10-CM | POA: Insufficient documentation

## 2021-04-05 NOTE — Telephone Encounter (Signed)
Called pt regarding no show for OT evaluation today. Pt reports she called to cancel because she wasn't sure if she was cleared for therapy yet. OT reviewed chart and last MD note from 2/7 indicates clear for therapy to begin. Pt scheduled for eval on 2/14 and is agreeable to the date and time.     Ezra Sites, OTR/L  [phone]

## 2021-04-08 ENCOUNTER — Ambulatory Visit (HOSPITAL_COMMUNITY): Payer: Medicaid Other | Admitting: Occupational Therapy

## 2021-04-09 ENCOUNTER — Other Ambulatory Visit: Payer: Self-pay

## 2021-04-09 ENCOUNTER — Ambulatory Visit (HOSPITAL_COMMUNITY): Payer: Medicaid Other

## 2021-04-09 ENCOUNTER — Encounter (HOSPITAL_COMMUNITY): Payer: Self-pay

## 2021-04-09 DIAGNOSIS — M25611 Stiffness of right shoulder, not elsewhere classified: Secondary | ICD-10-CM | POA: Diagnosis not present

## 2021-04-09 DIAGNOSIS — M25621 Stiffness of right elbow, not elsewhere classified: Secondary | ICD-10-CM | POA: Diagnosis not present

## 2021-04-09 DIAGNOSIS — M25511 Pain in right shoulder: Secondary | ICD-10-CM

## 2021-04-09 DIAGNOSIS — R29898 Other symptoms and signs involving the musculoskeletal system: Secondary | ICD-10-CM | POA: Diagnosis not present

## 2021-04-09 NOTE — Patient Instructions (Signed)
TOWEL SLIDES COMPLETE FOR 1-3 MINUTES, 3-5 TIMES PER DAY  SHOULDER: Flexion On Table   Place hands on table, elbows straight. Move hips away from body. Press hands down into table.   Abduction (Passive)   With arm out to side, resting on table, lower head toward arm, keeping trunk away from table.  Copyright  VHI. All rights reserved.     Internal Rotation (Assistive)   Seated with elbow bent at right angle and held against side, slide arm on table surface in an inward arc.  Activity: Use this motion to brush crumbs off the table.  Copyright  VHI. All rights reserved.    COMPLETE PENDULUM EXERCISES FOR 30 SECONDS TO A MINUTE EACH, 3-5 TIMES PER DAY.  Copyright  VHI. All rights reserved.  Pendulum Forward/Back   Bend forward 90 at waist, using table for support. Rock body forward and back to swing arm.    Copyright  VHI. All rights reserved.  AROM: Wrist Extension   With right palm down, bend wrist up. Repeat 10____ times per set. Do ____ sets per session. Do __3__ sessions per day.  Copyright  VHI. All rights reserved.   AROM: Wrist Flexion   With right palm up, bend wrist up. Repeat ___10_ times per set. Do ____ sets per session. Do __3__ sessions per day.  Copyright  VHI. All rights reserved.   AROM: Forearm Pronation / Supination   With right arm in handshake position, slowly rotate palm down until stretch is felt. Relax. Then rotate palm up until stretch is felt. Repeat __10__ times per set. Do ____ sets per session. Do __3__ sessions per day.  Copyright  VHI. All rights reserved.   AFlexion (Passive)  OR    Start with your arm at your side. Bend at your elbow to raise your forearm/hand upwards as shown. Then return to starting position and repeat.    Use other hand to bend elbow, with thumb toward same shoulder. Do NOT force this motion. Hold ____ seconds. Repeat ____ times. Do ____ sessions per day.

## 2021-04-09 NOTE — Therapy (Signed)
[facility] [address] [place], [place], [address] Phone: [phone]   Fax:  [phone]  Occupational Therapy Evaluation  Patient Details  Name: [name] MRN: [id number] Date of Birth: 09-15-1985 Referring Provider (OT): Larena Glassman, MD   Encounter Date: 04/09/2021   OT End of Session - 04/09/21 1409     Visit Number 1    Number of Visits 24    Date for OT Re-Evaluation 07/02/21   mini reassess: 05/07/21   Authorization Type [employer]    Authorization Time Period 27 visits combined OT/PT/SLP Reminder: does not cover ES unattended, ionto, hot/col packs, [place]    Authorization - Visit Number 0    Authorization - Number of Visits 27    OT Start Time 1300    OT Stop Time 1340    OT Time Calculation (min) 40 min    Activity Tolerance Patient tolerated treatment well    Behavior During Therapy [facility] for tasks assessed/performed             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/31/2013   recurrant    IBS (irritable bowel syndrome)    with constipation   Shoulder dislocation, recurrent     Past Surgical History:  Procedure Laterality Date   DILATION AND CURETTAGE OF UTERUS  10/11/2017   incomplete abortion   DILATION AND EVACUATION N/A 10/11/2017   Procedure: suction dilation and evacuation;  Surgeon: Sloan Leiter, MD;  Location: [facility];  Service: Gynecology;  Laterality: N/A;   SHOULDER ARTHROSCOPY WITH LABRAL REPAIR Right 03/21/2021   Procedure: SHOULDER ARTHROSCOPY WITH LABRAL REPAIR;  Surgeon: Mordecai Rasmussen, MD;  Location: [facility];  Service: Orthopedics;  Laterality: Right;   TUBAL LIGATION Bilateral 02/21/2017   pt decided to not have this done   TUBAL LIGATION Bilateral 01/08/2019   Procedure: POST PARTUM TUBAL LIGATION;  Surgeon: Aletha Halim, MD;  Location: [facility];  Service: Gynecology;  Laterality: Bilateral;    WISDOM TOOTH EXTRACTION      There were no vitals filed for this visit.   Subjective Assessment - 04/09/21 1308     Subjective  S: I put this surgery off for 10 years.    Pertinent History Patient is a 36 y/o female S/P right shoulder arthroscopic anterior stabilization completed 03/21/21 by Dr. Amedeo Kinsman due to frequent shoulder dislocations.    Patient Stated Goals To return to using her RUE as normal as possible.    Currently in Pain? Yes    Pain Score 1    Has gotten up to a 8/10   Pain Location Shoulder    Pain Orientation Right    Pain Descriptors / Indicators Aching    Pain Type Surgical pain    Pain Onset 1 to 4 weeks ago    Pain Frequency Intermittent    Aggravating Factors  nothing    Pain Relieving Factors pain medication as needed.    Effect of Pain on Daily Activities Pt is unable to utilize RUE for any daily tasks.               [facility] OT Assessment - 04/09/21 1310       Assessment   Medical Diagnosis s/p right shoulder anterior stabilization    Referring Provider (OT) Larena Glassman, MD    Onset Date/Surgical Date 03/21/21    Hand Dominance Right  Next MD Visit 04/30/21    Prior Therapy None      Precautions   Precautions Shoulder    Type of Shoulder Precautions Follow Athroscopic Shoulder Stabilization Protocol. Procotol in media session.    Shoulder Interventions Shoulder sling/immobilizer;Off for dressing/bathing/exercises      Restrictions   Weight Bearing Restrictions Yes    Other Position/Activity Restrictions RUE      Balance Screen   Has the patient fallen in the past 6 months No      Home  Environment   Family/patient expects to be discharged to: Private residence    Additional Comments Patient lives at home with husband and 6 children.      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 May lift up to 50lbs max      ADL   ADL  comments Pt is unable to her RUE for any daily tasks.      Mobility   Mobility Status Independent      Written Expression   Dominant Hand Right      Vision - History   Baseline Vision No visual deficits      Cognition   Overall Cognitive Status Within Functional Limits for tasks assessed      Observation/Other Assessments   Other Surveys  Select    Upper Extremity Functional Index  3      ROM / Strength   AROM / PROM / Strength PROM;Strength;AROM      Palpation   Palpation comment Max fascial restriction palpated in right upper arm, upper trapezius, and scapularis region.      AROM   Overall AROM  Unable to assess;Due to precautions      PROM   Overall PROM Comments Assessed supine. IR/er adducted    PROM Assessment Site Shoulder;Elbow    Right/Left Shoulder Right    Right Shoulder Flexion 60 Degrees    Right Shoulder ABduction 45 Degrees    Right Shoulder Internal Rotation 90 Degrees    Right Shoulder External Rotation 0 Degrees    Right/Left Elbow Right    Right Elbow Flexion --   WNL   Right Elbow Extension -39      Strength   Overall Strength Unable to assess;Due to precautions                            UEFI - 04/09/21 0001     UEFI Total Score 3              OT Education - 04/09/21 1351     Education Details table slides, A/ROM wrist, forearm, elbow, pendulum    Person(s) Educated Patient    Methods Explanation;Demonstration;Verbal cues;Handout    Comprehension Returned demonstration;Verbalized understanding              OT Short Term Goals - 04/09/21 1417       OT SHORT TERM GOAL #1   Title Patient will be educated and independent with HEP in order to faciliate her progress in therapy and begin using her RUE as her dominant extremity for 50% of daily tasks.    Time 6    Period Weeks    Status New    Target Date 05/21/21      OT SHORT TERM GOAL #2   Title Patient will increase her RUE P/ROM to  WFL in order to  increase her ability to complete bathing and dressing tasks with less difficulty.    Time 6    Period Weeks    Status New      OT SHORT TERM GOAL #3   Title Patient will reports a decrease in RUE pain of approximately 5/10 or less when using her UE to complete waist level tasks.    Time 6    Period Weeks    Status New      OT SHORT TERM GOAL #4   Title Patient will increase her RUE strength to 3/5 in order to complete tasks at shoulder level.    Time 6    Period Weeks    Status New      OT SHORT TERM GOAL #5   Title Patient will derease her fascial restrictions to moderate amount in order to increase the functional mobility needed to complete reaching tasks to shoulder level.    Time 6    Period Weeks    Status New               OT Long Term Goals - 04/09/21 1425       OT LONG TERM GOAL #1   Title Patient will increase her RUE A/ROM to WNL in order to reach overhead and/or behind her back with less difficulty.    Time 12    Period Weeks    Status New    Target Date 07/02/21      OT LONG TERM GOAL #2   Title Patient will increase her RUE strength to 5/5 in order to be able to return to work and begin lifting and moving packages of at least 15-20lbs with less difficulty.    Time 12    Period Weeks    Status New      OT LONG TERM GOAL #3   Title Patient will report a pain level of approximately 2/10 in her RUE while completing light housekeeping tasks.    Time 12    Period Weeks    Status New      OT LONG TERM GOAL #4   Title Patient will decrease her RUE fascial restrictions to min amount or less in order to increase the functional mobility needed to complete overhead reaching tasks.    Time 12    Period Weeks    Status New                   Plan - 04/09/21 1413     Clinical Impression Statement A: Patient is a 36 y/o female S/P right shoulder arthroscopic shoulder stabilization causing increased pain, fascial restrictions, and decreased ROM and  strength resulting in extreme difficulty utilizing her RUE as her dominant extremity.    OT Occupational Profile and History Problem Focused Assessment - Including review of records relating to presenting problem    Occupational performance deficits (Please refer to evaluation for details): ADL's;Work;Leisure;IADL's;Rest and Sleep    Body Structure / Function / Physical Skills ADL;UE functional use;Fascial restriction;Pain;ROM;Strength;Edema;IADL;Decreased knowledge of precautions;Mobility    Rehab Potential Excellent    Clinical Decision Making Several treatment options, min-mod task modification necessary    Comorbidities Affecting Occupational Performance: None    Modification or Assistance to Complete Evaluation  No modification of tasks or assist necessary to complete eval    OT Frequency 2x / week    OT Duration 12 weeks    OT Treatment/Interventions Self-care/ADL training;Patient/family education;DME and/or AE instruction;Passive range  of motion;Therapeutic exercise;Neuromuscular education;Manual Therapy;Therapeutic activities    Plan P: Patient will beneift from skilled OT services to increase functional use of her RUE and return to using it as her dominant extremity in order to return to work. Treatment plan: Follow up protocol. Myofascial release, manual stretching, P/ROM, AA/ROM, A/ROM, general shoulder strengthening.    OT Home Exercise Plan eval: table slide, A/ROM elbow, forearm, wrist, pendulum    Consulted and Agree with Plan of Care Patient             Patient will benefit from skilled therapeutic intervention in order to improve the following deficits and impairments:   Body Structure / Function / Physical Skills: ADL, UE functional use, Fascial restriction, Pain, ROM, Strength, Edema, IADL, Decreased knowledge of precautions, Mobility       Visit Diagnosis: Other symptoms and signs involving the musculoskeletal system - Plan: Ot plan of care cert/re-cert  Stiffness of  right shoulder, not elsewhere classified - Plan: Ot plan of care cert/re-cert  Stiffness of right elbow, not elsewhere classified - Plan: Ot plan of care cert/re-cert  Acute pain of right shoulder - Plan: Ot plan of care cert/re-cert    Problem List Patient Active Problem List   Diagnosis Date Noted   Tenderness of female pelvic organs 03/08/2020   Vaginal discharge 03/08/2020   Vaginal odor 03/08/2020   History of IBS 10/26/2019   Constipation 10/26/2019   LLQ pain 10/26/2019   Acute bilateral low back pain without sciatica 10/26/2019   History of gestational diabetes 11/02/2018   IBS (irritable bowel syndrome) 10/25/2015   BV (bacterial vaginosis) 01/18/2014   Ailene Ravel, OTR/L,CBIS  [phone]  04/09/2021, 2:29 PM  [facility] [address] [place], [place], [address] Phone: [phone]   Fax:  [phone]  Name: [name] MRN: [id number] Date of Birth: May 12, 1985

## 2021-04-10 ENCOUNTER — Encounter (HOSPITAL_COMMUNITY): Payer: Medicaid Other

## 2021-04-11 ENCOUNTER — Other Ambulatory Visit: Payer: Self-pay

## 2021-04-11 ENCOUNTER — Encounter (HOSPITAL_COMMUNITY): Payer: Self-pay

## 2021-04-11 ENCOUNTER — Ambulatory Visit (HOSPITAL_COMMUNITY): Payer: Medicaid Other

## 2021-04-11 DIAGNOSIS — M25621 Stiffness of right elbow, not elsewhere classified: Secondary | ICD-10-CM

## 2021-04-11 DIAGNOSIS — M25511 Pain in right shoulder: Secondary | ICD-10-CM | POA: Diagnosis not present

## 2021-04-11 DIAGNOSIS — M25611 Stiffness of right shoulder, not elsewhere classified: Secondary | ICD-10-CM | POA: Diagnosis not present

## 2021-04-11 DIAGNOSIS — R29898 Other symptoms and signs involving the musculoskeletal system: Secondary | ICD-10-CM

## 2021-04-11 NOTE — Therapy (Addendum)
[facility] [place], [place], [address] Phone: [phone]   Fax:  [phone]  Occupational Therapy Treatment  Patient Details  Name: [name] MRN: [id number] Date of Birth: Dec 19, 1985 Referring Provider (OT): Larena Glassman, MD   Encounter Date: 04/11/2021   OT End of Session - 04/11/21 1019     Visit Number 2    Number of Visits 24    Date for OT Re-Evaluation 07/02/21   mini reassess: 05/07/21   Authorization Type [employer]    Authorization Time Period 27 visits combined OT/PT/SLP Reminder: does not cover ES unattended, ionto, hot/col packs, [place] 1 re-eval and 15 treatment visits approved (04/11/21-05/20/21)   Authorization - Visit Number 1   Authorization - Number of Visits 15   OT Start Time 0951   Pt arrived late   OT Stop Time 1026    OT Time Calculation (min) 35 min    Activity Tolerance Patient tolerated treatment well    Behavior During Therapy [facility] for tasks assessed/performed             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/31/2013   recurrant    IBS (irritable bowel syndrome)    with constipation   Shoulder dislocation, recurrent     Past Surgical History:  Procedure Laterality Date   DILATION AND CURETTAGE OF UTERUS  10/11/2017   incomplete abortion   DILATION AND EVACUATION N/A 10/11/2017   Procedure: suction dilation and evacuation;  Surgeon: Sloan Leiter, MD;  Location: [facility];  Service: Gynecology;  Laterality: N/A;   SHOULDER ARTHROSCOPY WITH LABRAL REPAIR Right 03/21/2021   Procedure: SHOULDER ARTHROSCOPY WITH LABRAL REPAIR;  Surgeon: Mordecai Rasmussen, MD;  Location: [facility];  Service: Orthopedics;  Laterality: Right;   TUBAL LIGATION Bilateral 02/21/2017   pt decided to not have this done   TUBAL LIGATION Bilateral 01/08/2019   Procedure: POST PARTUM TUBAL LIGATION;  Surgeon: Aletha Halim,  MD;  Location: [facility];  Service: Gynecology;  Laterality: Bilateral;   WISDOM TOOTH EXTRACTION      There were no vitals filed for this visit.   Subjective Assessment - 04/11/21 0954     Subjective  S: The pendulum and elbow exercise are the toughest.    Currently in Pain? No/denies                [facility] OT Assessment - 04/11/21 0955       Assessment   Medical Diagnosis s/p right shoulder anterior stabilization      Precautions   Precautions Shoulder    Type of Shoulder Precautions Follow Athroscopic Shoulder Stabilization Protocol. Procotol in media session.    Shoulder Interventions Shoulder sling/immobilizer;Off for dressing/bathing/exercises                      OT Treatments/Exercises (OP) - 04/11/21 1013       Exercises   Exercises Shoulder;Elbow      Shoulder Exercises: Supine   Protraction PROM;10 reps    Horizontal ABduction PROM;10 reps    External Rotation PROM;10 reps    Internal Rotation PROM;10 reps    Flexion PROM;10 reps    ABduction PROM;10 reps      Shoulder Exercises: Seated   Extension AROM;10 reps    Row AROM;10 reps    Other Seated Exercises Scapular depression, A/ROM, 10X  Shoulder Exercises: Therapy Ball   Flexion 10 reps   2" hold at end stretch   ABduction 10 reps   2" hold at end stretch     Manual Therapy   Manual Therapy Myofascial release    Manual therapy comments Manual therapy completed prior to exercises.    Myofascial Release Myofascial release and manual stretching completed to right upper arm, upper trapezius, scapularis region and elbow to decrease fascial restrictions and increase joint mobility in a pain free zone.                      OT Short Term Goals - 04/11/21 1019       OT SHORT TERM GOAL #1   Title Patient will be educated and independent with HEP in order to faciliate her progress in therapy and begin using her RUE as her dominant extremity for 50% of daily tasks.    Time 6     Period Weeks    Status On-going    Target Date 05/21/21      OT SHORT TERM GOAL #2   Title Patient will increase her RUE P/ROM to [facility] in order to increase her ability to complete bathing and dressing tasks with less difficulty.    Time 6    Period Weeks    Status On-going      OT SHORT TERM GOAL #3   Title Patient will reports a decrease in RUE pain of approximately 5/10 or less when using her UE to complete waist level tasks.    Time 6    Period Weeks    Status On-going      OT SHORT TERM GOAL #4   Title Patient will increase her RUE strength to 3/5 in order to complete tasks at shoulder level.    Time 6    Period Weeks    Status On-going      OT SHORT TERM GOAL #5   Title Patient will derease her fascial restrictions to moderate amount in order to increase the functional mobility needed to complete reaching tasks to shoulder level.    Time 6    Period Weeks    Status On-going               OT Long Term Goals - 04/11/21 1019       OT LONG TERM GOAL #1   Title Patient will increase her RUE A/ROM to WNL in order to reach overhead and/or behind her back with less difficulty.    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    Time 12    Period Weeks    Status On-going      OT LONG TERM GOAL #3   Title Patient will report a pain level of approximately 2/10 in her RUE while completing light housekeeping tasks.    Time 12    Period Weeks    Status On-going      OT LONG TERM GOAL #4   Title Patient will decrease her RUE fascial restrictions to min amount or less in order to increase the functional mobility needed to complete overhead reaching tasks.    Time 12  Period Weeks    Status On-going                   Plan - 04/11/21  1020     Clinical Impression Statement A: Initiated myofascial release to right elbow, upper arm, upper trapezus and scapularis region to address fascial restrictions. Pt did muscle guard during passive stretching and cueing was provided to release if able. External rotation continues to be limited at around zero degrees. Patient was able to achieve full passive elbow extension this date. VC fo rform and technique were provided during session.    Body Structure / Function / Physical Skills ADL;UE functional use;Fascial restriction;Pain;ROM;Strength;Edema;IADL;Decreased knowledge of precautions;Mobility    Plan P: Continue with protocol for Phase 1. Manual techniques to elbow to increase extension. Decrease muscle guarding during passive stretching. Add low level thumb tacks and pro/ret/elev/dep.    Consulted and Agree with Plan of Care Patient             Patient will benefit from skilled therapeutic intervention in order to improve the following deficits and impairments:   Body Structure / Function / Physical Skills: ADL, UE functional use, Fascial restriction, Pain, ROM, Strength, Edema, IADL, Decreased knowledge of precautions, Mobility       Visit Diagnosis: Stiffness of right shoulder, not elsewhere classified  Other symptoms and signs involving the musculoskeletal system  Stiffness of right elbow, not elsewhere classified  Acute pain of right shoulder    Problem List Patient Active Problem List   Diagnosis Date Noted   Tenderness of female pelvic organs 03/08/2020   Vaginal discharge 03/08/2020   Vaginal odor 03/08/2020   History of IBS 10/26/2019   Constipation 10/26/2019   LLQ pain 10/26/2019   Acute bilateral low back pain without sciatica 10/26/2019   History of gestational diabetes 11/02/2018   IBS (irritable bowel syndrome) 10/25/2015   BV (bacterial vaginosis) 01/18/2014    Ailene Ravel, OTR/L,CBIS  [phone]  04/11/2021, 10:29 AM  [facility] [address] [place], [place], [address] Phone: [phone]   Fax:  [phone]  Name: NATALEA SUTLIFF MRN: [id number] Date of Birth: Oct 16, 1985

## 2021-04-16 ENCOUNTER — Ambulatory Visit (HOSPITAL_COMMUNITY): Payer: Medicaid Other | Admitting: Occupational Therapy

## 2021-04-16 ENCOUNTER — Encounter (HOSPITAL_COMMUNITY): Payer: Self-pay | Admitting: Occupational Therapy

## 2021-04-16 ENCOUNTER — Other Ambulatory Visit: Payer: Self-pay

## 2021-04-16 DIAGNOSIS — R29898 Other symptoms and signs involving the musculoskeletal system: Secondary | ICD-10-CM | POA: Diagnosis not present

## 2021-04-16 DIAGNOSIS — M25621 Stiffness of right elbow, not elsewhere classified: Secondary | ICD-10-CM

## 2021-04-16 DIAGNOSIS — M25611 Stiffness of right shoulder, not elsewhere classified: Secondary | ICD-10-CM | POA: Diagnosis not present

## 2021-04-16 DIAGNOSIS — M25511 Pain in right shoulder: Secondary | ICD-10-CM

## 2021-04-16 NOTE — Therapy (Addendum)
[place], [place], [address] Phone: [phone]   Fax:  [phone]  Occupational Therapy Treatment  Patient Details  Name: [name] MRN: [id number] Date of Birth: Sep 21, 1985 Referring Provider (OT): Larena Glassman, MD   Encounter Date: 04/16/2021   OT End of Session - 04/16/21 0803     Visit Number 3    Number of Visits 24    Date for OT Re-Evaluation 07/02/21   mini reassess: 05/07/21   Authorization Type [employer]    Authorization Time Period 27 visits combined OT/PT/SLP Reminder: does not cover ES unattended, ionto, hot/col packs, [place]. [employer] has approved 1 re-eval and 15 treatment visits (04/11/21-05/20/21)   Authorization - Visit Number 2   Authorization - Number of Visits 15   OT Start Time 0732    OT Stop Time 0810    OT Time Calculation (min) 38 min    Activity Tolerance Patient tolerated treatment well    Behavior During Therapy [facility] for tasks assessed/performed             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/31/2013   recurrant    IBS (irritable bowel syndrome)    with constipation   Shoulder dislocation, recurrent     Past Surgical History:  Procedure Laterality Date   DILATION AND CURETTAGE OF UTERUS  10/11/2017   incomplete abortion   DILATION AND EVACUATION N/A 10/11/2017   Procedure: suction dilation and evacuation;  Surgeon: Sloan Leiter, MD;  Location: [facility];  Service: Gynecology;  Laterality: N/A;   SHOULDER ARTHROSCOPY WITH LABRAL REPAIR Right 03/21/2021   Procedure: SHOULDER ARTHROSCOPY WITH LABRAL REPAIR;  Surgeon: Mordecai Rasmussen, MD;  Location: [facility];  Service: Orthopedics;  Laterality: Right;   TUBAL LIGATION Bilateral 02/21/2017   pt decided to not have this done   TUBAL LIGATION Bilateral 01/08/2019   Procedure: POST PARTUM TUBAL LIGATION;  Surgeon: Aletha Halim,  MD;  Location: [facility];  Service: Gynecology;  Laterality: Bilateral;   WISDOM TOOTH EXTRACTION      There were no vitals filed for this visit.   Subjective Assessment - 04/16/21 0733     Subjective  S: My elbow is bothering me more than my shoulder.    Currently in Pain? No/denies                [facility] OT Assessment - 04/16/21 0733       Assessment   Medical Diagnosis s/p right shoulder anterior stabilization      Precautions   Precautions Shoulder    Type of Shoulder Precautions Follow Athroscopic Shoulder Stabilization Protocol. Procotol in media session.    Shoulder Interventions Shoulder sling/immobilizer;Off for dressing/bathing/exercises                      OT Treatments/Exercises (OP) - 04/16/21 0734       Exercises   Exercises Shoulder;Elbow      Shoulder Exercises: Supine   Protraction PROM;10 reps    Horizontal ABduction PROM;10 reps    Horizontal ABduction Limitations abduction only, no adduction per protocol    External Rotation PROM;10 reps    Internal Rotation PROM;10 reps    Flexion PROM;10 reps    ABduction PROM;10 reps      Shoulder Exercises: Seated   Extension AROM;10 reps    Row  AROM;10 reps    Other Seated Exercises Scapular depression, A/ROM, 10X      Shoulder Exercises: Therapy Ball   Flexion 10 reps   2" hold at end range   ABduction 10 reps   2" hold at end range     Shoulder Exercises: ROM/Strengthening   Thumb Tacks 1' low level    Prot/Ret//Elev/Dep 1' low level      Shoulder Exercises: Isometric Strengthening   Flexion Supine;3X3"    Extension Supine;3X3"    External Rotation Supine;3X3"    Internal Rotation Supine;3X3"    ABduction Supine;3X3"      Manual Therapy   Manual Therapy Myofascial release    Manual therapy comments Manual therapy completed prior to exercises.    Myofascial Release Myofascial release and manual stretching completed to right upper arm, upper trapezius, scapularis region and elbow  to decrease fascial restrictions and increase joint mobility in a pain free zone.                      OT Short Term Goals - 04/11/21 1019       OT SHORT TERM GOAL #1   Title Patient will be educated and independent with HEP in order to faciliate her progress in therapy and begin using her RUE as her dominant extremity for 50% of daily tasks.    Time 6    Period Weeks    Status On-going    Target Date 05/21/21      OT SHORT TERM GOAL #2   Title Patient will increase her RUE P/ROM to [facility] in order to increase her ability to complete bathing and dressing tasks with less difficulty.    Time 6    Period Weeks    Status On-going      OT SHORT TERM GOAL #3   Title Patient will reports a decrease in RUE pain of approximately 5/10 or less when using her UE to complete waist level tasks.    Time 6    Period Weeks    Status On-going      OT SHORT TERM GOAL #4   Title Patient will increase her RUE strength to 3/5 in order to complete tasks at shoulder level.    Time 6    Period Weeks    Status On-going      OT SHORT TERM GOAL #5   Title Patient will derease her fascial restrictions to moderate amount in order to increase the functional mobility needed to complete reaching tasks to shoulder level.    Time 6    Period Weeks    Status On-going               OT Long Term Goals - 04/11/21 1019       OT LONG TERM GOAL #1   Title Patient will increase her RUE A/ROM to WNL in order to reach overhead and/or behind her back with less difficulty.    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    Time 12    Period Weeks    Status On-going      OT LONG TERM GOAL #3   Title Patient will  report a pain level of approximately 2/10 in her RUE while  completing light housekeeping tasks.    Time 12    Period Weeks    Status On-going      OT LONG TERM GOAL #4   Title Patient will decrease her RUE fascial restrictions to min amount or less in order to increase the functional mobility needed to complete overhead reaching tasks.    Time 12    Period Weeks    Status On-going                   Plan - 04/16/21 0759     Clinical Impression Statement A: Continued with myofascial release to right elbow, upper arm, upper trapezus and scapularis region to address fascial restrictions. Improvement in muscle guarding today, full elbow extension noted. Added isometrics, thumb tacks, and prot/ret/elev/dep at a low level. Continued with therapy ball stretches and scapular A/ROM. Verbal cuing for form and technique.    Body Structure / Function / Physical Skills ADL;UE functional use;Fascial restriction;Pain;ROM;Strength;Edema;IADL;Decreased knowledge of precautions;Mobility    Plan P: progress to phase II of protocol, increase isometric holds, add pulleys    OT Home Exercise Plan eval: table slide, A/ROM elbow, forearm, wrist, pendulum    Consulted and Agree with Plan of Care Patient             Patient will benefit from skilled therapeutic intervention in order to improve the following deficits and impairments:   Body Structure / Function / Physical Skills: ADL, UE functional use, Fascial restriction, Pain, ROM, Strength, Edema, IADL, Decreased knowledge of precautions, Mobility       Visit Diagnosis: Stiffness of right shoulder, not elsewhere classified  Other symptoms and signs involving the musculoskeletal system  Stiffness of right elbow, not elsewhere classified  Acute pain of right shoulder    Problem List Patient Active Problem List   Diagnosis Date Noted   Tenderness of female pelvic organs 03/08/2020   Vaginal discharge 03/08/2020   Vaginal odor 03/08/2020   History of IBS 10/26/2019   Constipation 10/26/2019    LLQ pain 10/26/2019   Acute bilateral low back pain without sciatica 10/26/2019   History of gestational diabetes 11/02/2018   IBS (irritable bowel syndrome) 10/25/2015   BV (bacterial vaginosis) 01/18/2014    Guadelupe Sabin, OTR/L  [phone] 04/16/2021, 8:11 AM  [place], [place], [address] Phone: [phone]   Fax:  [phone]  Name: [name] MRN: [id number] Date of Birth: 1985/12/05

## 2021-04-18 ENCOUNTER — Encounter (HOSPITAL_COMMUNITY): Payer: Self-pay

## 2021-04-18 ENCOUNTER — Ambulatory Visit (HOSPITAL_COMMUNITY): Payer: Medicaid Other

## 2021-04-18 ENCOUNTER — Other Ambulatory Visit: Payer: Self-pay

## 2021-04-18 DIAGNOSIS — M25611 Stiffness of right shoulder, not elsewhere classified: Secondary | ICD-10-CM

## 2021-04-18 DIAGNOSIS — R29898 Other symptoms and signs involving the musculoskeletal system: Secondary | ICD-10-CM

## 2021-04-18 DIAGNOSIS — M25621 Stiffness of right elbow, not elsewhere classified: Secondary | ICD-10-CM | POA: Diagnosis not present

## 2021-04-18 DIAGNOSIS — M25511 Pain in right shoulder: Secondary | ICD-10-CM | POA: Diagnosis not present

## 2021-04-18 NOTE — Therapy (Signed)
[place], [place], [address] Phone: [phone]   Fax:  [phone]  Occupational Therapy Treatment  Patient Details  Name: [name] MRN: [id number] Date of Birth: 10/20/1985 Referring Provider (OT): Larena Glassman, MD   Encounter Date: 04/18/2021   OT End of Session - 04/18/21 0838     Visit Number 4    Number of Visits 24    Date for OT Re-Evaluation 07/02/21   mini reassess: 05/07/21   Authorization Type [employer]    Authorization Time Period 27 visits combined OT/PT/SLP Reminder: does not cover ES unattended, ionto, hot/col packs, [place]. [employer] has approved 1 re-eval and 15 treatment visits (04/11/21-05/20/21)    Authorization - Visit Number 3    Authorization - Number of Visits 15    OT Start Time 0825   pt arrived late   OT Stop Time 0855    OT Time Calculation (min) 30 min    Activity Tolerance Patient tolerated treatment well    Behavior During Therapy [facility] for tasks assessed/performed             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/31/2013   recurrant    IBS (irritable bowel syndrome)    with constipation   Shoulder dislocation, recurrent     Past Surgical History:  Procedure Laterality Date   DILATION AND CURETTAGE OF UTERUS  10/11/2017   incomplete abortion   DILATION AND EVACUATION N/A 10/11/2017   Procedure: suction dilation and evacuation;  Surgeon: Sloan Leiter, MD;  Location: [place] [facility];  Service: Gynecology;  Laterality: N/A;   SHOULDER ARTHROSCOPY WITH LABRAL REPAIR Right 03/21/2021   Procedure: SHOULDER ARTHROSCOPY WITH LABRAL REPAIR;  Surgeon: Mordecai Rasmussen, MD;  Location: [facility];  Service: Orthopedics;  Laterality: Right;   TUBAL LIGATION Bilateral 02/21/2017   pt decided to not have this done   TUBAL LIGATION Bilateral 01/08/2019   Procedure: POST PARTUM TUBAL LIGATION;  Surgeon:  Aletha Halim, MD;  Location: [facility];  Service: Gynecology;  Laterality: Bilateral;   WISDOM TOOTH EXTRACTION      There were no vitals filed for this visit.   Subjective Assessment - 04/18/21 0836     Subjective  S: I didn't wear my sling last night.    Currently in Pain? No/denies                [facility] OT Assessment - 04/18/21 0836       Assessment   Medical Diagnosis s/p right shoulder anterior stabilization      Precautions   Precautions Shoulder    Type of Shoulder Precautions Follow Athroscopic Shoulder Stabilization Protocol. Procotol in media session.    Shoulder Interventions Shoulder sling/immobilizer;Off for dressing/bathing/exercises                      OT Treatments/Exercises (OP) - 04/18/21 0836       Exercises   Exercises Shoulder;Elbow      Shoulder Exercises: Supine   Protraction PROM;5 reps;AAROM;10 reps    Horizontal ABduction PROM;5 reps;AAROM;10 reps    External Rotation PROM;5 reps;AAROM;10 reps    Internal Rotation PROM;5 reps;AAROM;10 reps    Flexion PROM;5 reps;AAROM;10 reps    ABduction PROM;5 reps      Shoulder Exercises: Standing   Other Standing Exercises PVC pipe slide; 10X flexion  Shoulder Exercises: Pulleys   Flexion 1 minute   standing                   OT Education - 04/18/21 0838     Education Details AA/ROM - supine only (abduction omitted)    Person(s) Educated Patient    Methods Explanation;Demonstration;Verbal cues;Handout    Comprehension Returned demonstration;Verbalized understanding              OT Short Term Goals - 04/11/21 1019       OT SHORT TERM GOAL #1   Title Patient will be educated and independent with HEP in order to faciliate her progress in therapy and begin using her RUE as her dominant extremity for 50% of daily tasks.    Time 6    Period Weeks    Status On-going    Target Date 05/21/21      OT SHORT TERM GOAL #2   Title Patient will increase her RUE  P/ROM to [facility] in order to increase her ability to complete bathing and dressing tasks with less difficulty.    Time 6    Period Weeks    Status On-going      OT SHORT TERM GOAL #3   Title Patient will reports a decrease in RUE pain of approximately 5/10 or less when using her UE to complete waist level tasks.    Time 6    Period Weeks    Status On-going      OT SHORT TERM GOAL #4   Title Patient will increase her RUE strength to 3/5 in order to complete tasks at shoulder level.    Time 6    Period Weeks    Status On-going      OT SHORT TERM GOAL #5   Title Patient will derease her fascial restrictions to moderate amount in order to increase the functional mobility needed to complete reaching tasks to shoulder level.    Time 6    Period Weeks    Status On-going               OT Long Term Goals - 04/11/21 1019       OT LONG TERM GOAL #1   Title Patient will increase her RUE A/ROM to WNL in order to reach overhead and/or behind her back with less difficulty.    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    Time 12    Period Weeks    Status On-going      OT LONG TERM GOAL #3   Title Patient will report a pain level of approximately 2/10 in her RUE while completing light housekeeping tasks.    Time 12    Period Weeks    Status On-going      OT LONG TERM GOAL #4   Title Patient will decrease her RUE fascial restrictions to min amount or less in order to increase the functional mobility needed to complete overhead reaching tasks.    Time 12    Period Weeks    Status On-going                   Plan -  04/18/21 0844     Clinical Impression Statement A: Myofascial release held due to time constraint. Progressed to AA/ROM supine and  standing. VC for form and technique provided. Continues to have limited ROM with all shoulder ranges. Added pvc pipe slide and flexion with pulleys.    Body Structure / Function / Physical Skills ADL;UE functional use;Fascial restriction;Pain;ROM;Strength;Edema;IADL;Decreased knowledge of precautions;Mobility    Plan P: Continue with AA/ROM and progress to standing. Add either scaption or abduction with pulleys. Follow up on HEP.    OT Home Exercise Plan eval: table slide, A/ROM elbow, forearm, wrist, pendulum 2/23: AA/ROM    Consulted and Agree with Plan of Care Patient             Patient will benefit from skilled therapeutic intervention in order to improve the following deficits and impairments:   Body Structure / Function / Physical Skills: ADL, UE functional use, Fascial restriction, Pain, ROM, Strength, Edema, IADL, Decreased knowledge of precautions, Mobility       Visit Diagnosis: Stiffness of right shoulder, not elsewhere classified  Stiffness of right elbow, not elsewhere classified  Other symptoms and signs involving the musculoskeletal system  Acute pain of right shoulder    Problem List Patient Active Problem List   Diagnosis Date Noted   Tenderness of female pelvic organs 03/08/2020   Vaginal discharge 03/08/2020   Vaginal odor 03/08/2020   History of IBS 10/26/2019   Constipation 10/26/2019   LLQ pain 10/26/2019   Acute bilateral low back pain without sciatica 10/26/2019   History of gestational diabetes 11/02/2018   IBS (irritable bowel syndrome) 10/25/2015   BV (bacterial vaginosis) 01/18/2014    Ailene Ravel, OTR/L,CBIS  [phone]  04/18/2021, 11:32 AM  [place] [address] [place], [place], [address] Phone: [phone]   Fax:  [phone]  Name: [name] MRN: [id number] Date of Birth: 02-01-1986

## 2021-04-18 NOTE — Patient Instructions (Addendum)
Perform each exercise ____10-15____ reps. 2-3x days.  ° °1) Protraction  ° °Start by holding a wand or cane at chest height. ° °Next, slowly push the wand outwards in front of your body so that your elbows become fully straightened. Then, return to the original position.  ° ° ° °2) Shoulder FLEXION  ° °In the standing position, hold a wand/cane with both arms, palms down on both sides. Raise up the wand/cane allowing your unaffected arm to perform most of the effort. Your affected arm should be partially relaxed.   ° ° ° °3) Internal/External ROTATION  ° °In the standing position, hold a wand/cane with both hands keeping your elbows bent. Move your arms and wand/cane to one side.  Your affected arm should be partially relaxed while your unaffected arm performs most of the effort.   ° ° ° ° ° ° °5) Horizontal Abduction/Adduction ° ° ° ° ° °Straight arms holding cane at shoulder height, bring cane to right, center, left. Repeat starting to left. ° ° °Copyright © [employer]. All rights reserved.  ° ° ° ° °

## 2021-04-22 ENCOUNTER — Telehealth: Payer: Self-pay | Admitting: Orthopedic Surgery

## 2021-04-22 NOTE — Telephone Encounter (Signed)
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

## 2021-04-23 ENCOUNTER — Other Ambulatory Visit: Payer: Self-pay

## 2021-04-23 ENCOUNTER — Ambulatory Visit (HOSPITAL_COMMUNITY): Payer: Medicaid Other | Admitting: Occupational Therapy

## 2021-04-23 ENCOUNTER — Encounter (HOSPITAL_COMMUNITY): Payer: Self-pay | Admitting: Occupational Therapy

## 2021-04-23 DIAGNOSIS — M25611 Stiffness of right shoulder, not elsewhere classified: Secondary | ICD-10-CM | POA: Diagnosis not present

## 2021-04-23 DIAGNOSIS — M25511 Pain in right shoulder: Secondary | ICD-10-CM | POA: Diagnosis not present

## 2021-04-23 DIAGNOSIS — M25621 Stiffness of right elbow, not elsewhere classified: Secondary | ICD-10-CM | POA: Diagnosis not present

## 2021-04-23 DIAGNOSIS — R29898 Other symptoms and signs involving the musculoskeletal system: Secondary | ICD-10-CM | POA: Diagnosis not present

## 2021-04-23 NOTE — Therapy (Signed)
[place], [place], [address] Phone: [phone]   Fax:  [phone]  Occupational Therapy Treatment  Patient Details  Name: [name] MRN: [id number] Date of Birth: [birth year] Referring Provider (OT): Larena Glassman, MD   Encounter Date: 04/23/2021   OT End of Session - 04/23/21 0810     Visit Number 5    Number of Visits 24    Date for OT Re-Evaluation 07/02/21   mini reassess: 05/07/21   Authorization Type [employer]    Authorization Time Period 27 visits combined OT/PT/SLP Reminder: does not cover ES unattended, ionto, hot/col packs, [place]. [employer] has approved 1 re-eval and 15 treatment visits (04/11/21-05/20/21)    Authorization - Visit Number 4    Authorization - Number of Visits 15    OT Start Time 0735    OT Stop Time 0815    OT Time Calculation (min) 40 min    Activity Tolerance Patient tolerated treatment well    Behavior During Therapy [facility] for tasks assessed/performed             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/31/2013   recurrant    IBS (irritable bowel syndrome)    with constipation   Shoulder dislocation, recurrent     Past Surgical History:  Procedure Laterality Date   DILATION AND CURETTAGE OF UTERUS  10/11/2017   incomplete abortion   DILATION AND EVACUATION N/A 10/11/2017   Procedure: suction dilation and evacuation;  Surgeon: Sloan Leiter, MD;  Location: [facility];  Service: Gynecology;  Laterality: N/A;   SHOULDER ARTHROSCOPY WITH LABRAL REPAIR Right 03/21/2021   Procedure: SHOULDER ARTHROSCOPY WITH LABRAL REPAIR;  Surgeon: Mordecai Rasmussen, MD;  Location: [facility];  Service: Orthopedics;  Laterality: Right;   TUBAL LIGATION Bilateral 02/21/2017   pt decided to not have this done   TUBAL LIGATION Bilateral 01/08/2019   Procedure: POST PARTUM TUBAL LIGATION;  Surgeon: Aletha Halim, MD;  Location: [facility];  Service: Gynecology;  Laterality: Bilateral;   WISDOM TOOTH EXTRACTION      There were no vitals filed for this visit.   Subjective Assessment - 04/23/21 0738     Subjective  S: I think I've overworked it.    Currently in Pain? Yes    Pain Score 7     Pain Location Shoulder    Pain Orientation Right    Pain Descriptors / Indicators Aching;Burning;Sore    Pain Type Acute pain    Pain Radiating Towards N/A    Pain Onset In the past 7 days    Pain Frequency Intermittent    Aggravating Factors  nothing    Pain Relieving Factors pain medication as needed    Effect of Pain on Daily Activities Max effect on ADLs    Multiple Pain Sites No                [facility] OT Assessment - 04/23/21 0736       Assessment   Medical Diagnosis s/p right shoulder anterior stabilization      Precautions   Precautions Shoulder    Type of Shoulder Precautions Follow Athroscopic Shoulder Stabilization Protocol. Procotol in media session.    Shoulder Interventions Shoulder sling/immobilizer;Off for dressing/bathing/exercises  OT Treatments/Exercises (OP) - 04/23/21 0739       Exercises   Exercises Shoulder;Elbow      Shoulder Exercises: Supine   Protraction PROM;5 reps;AAROM;10 reps    Horizontal ABduction PROM;5 reps;AAROM;10 reps    External Rotation PROM;5 reps;AAROM;10 reps    Internal Rotation PROM;5 reps;AAROM;10 reps    Flexion PROM;5 reps;AAROM;10 reps    ABduction PROM;5 reps      Shoulder Exercises: Standing   Protraction AAROM;10 reps    Horizontal ABduction --    External Rotation AAROM;10 reps    Internal Rotation AAROM;10 reps    Flexion AAROM;10 reps      Manual Therapy   Manual Therapy Myofascial release    Manual therapy comments Manual therapy completed prior to exercises.    Myofascial Release Myofascial release and manual stretching completed to right upper arm, upper trapezius, scapularis region  and elbow to decrease fascial restrictions and increase joint mobility in a pain free zone.                      OT Short Term Goals - 04/11/21 1019       OT SHORT TERM GOAL #1   Title Patient will be educated and independent with HEP in order to faciliate her progress in therapy and begin using her RUE as her dominant extremity for 50% of daily tasks.    Time 6    Period Weeks    Status On-going    Target Date 05/21/21      OT SHORT TERM GOAL #2   Title Patient will increase her RUE P/ROM to [facility] in order to increase her ability to complete bathing and dressing tasks with less difficulty.    Time 6    Period Weeks    Status On-going      OT SHORT TERM GOAL #3   Title Patient will reports a decrease in RUE pain of approximately 5/10 or less when using her UE to complete waist level tasks.    Time 6    Period Weeks    Status On-going      OT SHORT TERM GOAL #4   Title Patient will increase her RUE strength to 3/5 in order to complete tasks at shoulder level.    Time 6    Period Weeks    Status On-going      OT SHORT TERM GOAL #5   Title Patient will derease her fascial restrictions to moderate amount in order to increase the functional mobility needed to complete reaching tasks to shoulder level.    Time 6    Period Weeks    Status On-going               OT Long Term Goals - 04/11/21 1019       OT LONG TERM GOAL #1   Title Patient will increase her RUE A/ROM to WNL in order to reach overhead and/or behind her back with less difficulty.    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    Time 12    Period Weeks    Status On-going      OT LONG TERM GOAL #  3   Title Patient will report a pain level of approximately 2/10 in her RUE  while completing light housekeeping tasks.    Time 12    Period Weeks    Status On-going      OT LONG TERM GOAL #4   Title Patient will decrease her RUE fascial restrictions to min amount or less in order to increase the functional mobility needed to complete overhead reaching tasks.    Time 12    Period Weeks    Status On-going                   Plan - 04/23/21 0757     Clinical Impression Statement A: Pt reports increased pain since last week, did remove sling on Thursday and has been using her RUE more as well as starting new exercises. Continued with myofascial release to address fascial restrictions today, the most restrictions noted in anterior shoulder region. Passive stretching completed, pt with increased guarding at approximately 50% range, continues to be limited with er. Continued with AA/ROM this session, pt achieving greater ROM during AA/ROM versus P/ROM. Progressed to AA/ROM in standing, completing protraction, flexion, and er/IR. Pulley not added today due to time constraints. Verbal cuing for form and technique.    Body Structure / Function / Physical Skills ADL;UE functional use;Fascial restriction;Pain;ROM;Strength;Edema;IADL;Decreased knowledge of precautions;Mobility    Plan P: Follow up on pain level, continue with AA/ROM and add pulleys    OT Home Exercise Plan eval: table slide, A/ROM elbow, forearm, wrist, pendulum 2/23: AA/ROM    Consulted and Agree with Plan of Care Patient             Patient will benefit from skilled therapeutic intervention in order to improve the following deficits and impairments:   Body Structure / Function / Physical Skills: ADL, UE functional use, Fascial restriction, Pain, ROM, Strength, Edema, IADL, Decreased knowledge of precautions, Mobility       Visit Diagnosis: Stiffness of right shoulder, not elsewhere classified  Stiffness of right elbow, not elsewhere classified  Other symptoms and signs involving the  musculoskeletal system  Acute pain of right shoulder    Problem List Patient Active Problem List   Diagnosis Date Noted   Tenderness of female pelvic organs 03/08/2020   Vaginal discharge 03/08/2020   Vaginal odor 03/08/2020   History of IBS 10/26/2019   Constipation 10/26/2019   LLQ pain 10/26/2019   Acute bilateral low back pain without sciatica 10/26/2019   History of gestational diabetes 11/02/2018   IBS (irritable bowel syndrome) 10/25/2015   BV (bacterial vaginosis) 01/18/2014    Guadelupe Sabin, OTR/L  [phone] 04/23/2021, 8:15 AM  [place] [address] [place], [place], [address] Phone: [phone]   Fax:  [phone]  Name: [name] MRN: [id number] Date of Birth: 1985-05-07

## 2021-04-23 NOTE — Telephone Encounter (Signed)
I called [employer] s/w Feliciana Rossetti, I have refaxed FMLA for spouse to verified fax.  See Releases.  Called patient and advised as well.

## 2021-04-26 ENCOUNTER — Encounter (HOSPITAL_COMMUNITY): Payer: Self-pay | Admitting: Occupational Therapy

## 2021-04-26 ENCOUNTER — Ambulatory Visit (HOSPITAL_COMMUNITY): Payer: Medicaid Other | Attending: Orthopedic Surgery | Admitting: Occupational Therapy

## 2021-04-26 ENCOUNTER — Other Ambulatory Visit: Payer: Self-pay

## 2021-04-26 DIAGNOSIS — R29898 Other symptoms and signs involving the musculoskeletal system: Secondary | ICD-10-CM | POA: Diagnosis present

## 2021-04-26 DIAGNOSIS — M25611 Stiffness of right shoulder, not elsewhere classified: Secondary | ICD-10-CM | POA: Diagnosis not present

## 2021-04-26 DIAGNOSIS — M25511 Pain in right shoulder: Secondary | ICD-10-CM | POA: Insufficient documentation

## 2021-04-26 NOTE — Therapy (Signed)
[place] [facility] ?[address] ?[place], [place], [address] ?Phone: [phone]   Fax:  [phone] ? ?Occupational Therapy Treatment ? ?Patient Details  ?Name: [name] ?MRN: [id number] ?Date of Birth: [birth year] ?Referring Provider (OT): Thane Edu, MD ? ? ?Encounter Date: 04/26/2021 ? ? OT End of Session - 04/26/21 0943   ? ? Visit Number 6   ? Number of Visits 24   ? Date for OT Re-Evaluation 07/02/21   mini reassess: 05/07/21  ? Authorization Type [employer] [place] [facility] Medicaid   ? Authorization Time Period 27 visits combined OT/PT/SLP Reminder: does not cover ES unattended, ionto, hot/col packs, [place]. [employer] has approved 1 re-eval and 15 treatment visits (04/11/21-05/20/21)   ? Authorization - Visit Number 5   ? Authorization - Number of Visits 15   ? OT Start Time [phone]   ? OT Stop Time 0945   ? OT Time Calculation (min) 43 min   ? Activity Tolerance Patient tolerated treatment well   ? Behavior During Therapy [facility] for tasks assessed/performed   ? ?  ?  ? ?  ? ? ?Past Medical History:  ?Diagnosis Date  ? Anemia   ? Blood transfusion without reported diagnosis   ? Dislocated shoulder 03/31/2013  ? recurrant   ? IBS (irritable bowel syndrome)   ? with constipation  ? Shoulder dislocation, recurrent   ? ? ?Past Surgical History:  ?Procedure Laterality Date  ? DILATION AND CURETTAGE OF UTERUS  10/11/2017  ? incomplete abortion  ? DILATION AND EVACUATION N/A 10/11/2017  ? Procedure: suction dilation and evacuation;  Surgeon: Conan Bowens, MD;  Location: [facility];  Service: Gynecology;  Laterality: N/A;  ? SHOULDER ARTHROSCOPY WITH LABRAL REPAIR Right 03/21/2021  ? Procedure: SHOULDER ARTHROSCOPY WITH LABRAL REPAIR;  Surgeon: Oliver Barre, MD;  Location: [facility];  Service: Orthopedics;  Laterality: Right;  ? TUBAL LIGATION Bilateral 02/21/2017  ? pt decided to not have this done  ? TUBAL LIGATION Bilateral 01/08/2019  ? Procedure: POST PARTUM TUBAL LIGATION;  Surgeon: [place] Bing,  MD;  Location: [facility];  Service: Gynecology;  Laterality: Bilateral;  ? WISDOM TOOTH EXTRACTION    ? ? ?There were no vitals filed for this visit. ? ? Subjective Assessment - 04/26/21 0904   ? ? Subjective  S: I had to take pain medication the other day.   ? Currently in Pain? No/denies   ? ?  ?  ? ?  ? ? ? ? ? [facility] OT Assessment - 04/26/21 0904   ? ?  ? Assessment  ? Medical Diagnosis s/p right shoulder anterior stabilization   ?  ? Precautions  ? Precautions Shoulder   ? Type of Shoulder Precautions Follow Athroscopic Shoulder Stabilization Protocol. Procotol in media session.   ? Shoulder Interventions Shoulder sling/immobilizer;Off for dressing/bathing/exercises   ? ?  ?  ? ?  ? ? ? ? ? ? ? ? ? ? ? OT Treatments/Exercises (OP) - 04/26/21 9476   ? ?  ? Exercises  ? Exercises Shoulder   ?  ? Shoulder Exercises: Supine  ? Protraction PROM;5 reps;AAROM;10 reps   ? Horizontal ABduction PROM;5 reps;AAROM;10 reps   ? External Rotation PROM;5 reps;AAROM;10 reps   ? Internal Rotation PROM;5 reps;AAROM;10 reps   ? Flexion PROM;5 reps;AAROM;10 reps   ? ABduction PROM;5 reps;AAROM;10 reps   ?  ? Shoulder Exercises: Standing  ? Protraction AAROM;10 reps   ? Horizontal ABduction AAROM;10 reps   ?  External Rotation AAROM;10 reps   ? Internal Rotation AAROM;10 reps   ? Flexion AAROM;10 reps   ? ABduction AAROM;10 reps   ?  ? Manual Therapy  ? Manual Therapy Myofascial release   ? Manual therapy comments Manual therapy completed prior to exercises.   ? Myofascial Release Myofascial release and manual stretching completed to right upper arm, upper trapezius, scapularis region and elbow to decrease fascial restrictions and increase joint mobility in a pain free zone.   ? ?  ?  ? ?  ? ? ? ? ? ? ? ? ? ? ? OT Short Term Goals - 04/11/21 1019   ? ?  ? OT SHORT TERM GOAL #1  ? Title Patient will be educated and independent with HEP in order to faciliate her progress in therapy and begin using her RUE as her dominant extremity for  50% of daily tasks.   ? Time 6   ? Period Weeks   ? Status On-going   ? Target Date 05/21/21   ?  ? OT SHORT TERM GOAL #2  ? Title Patient will increase her RUE P/ROM to [facility] in order to increase her ability to complete bathing and dressing tasks with less difficulty.   ? Time 6   ? Period Weeks   ? Status On-going   ?  ? OT SHORT TERM GOAL #3  ? Title Patient will reports a decrease in RUE pain of approximately 5/10 or less when using her UE to complete waist level tasks.   ? Time 6   ? Period Weeks   ? Status On-going   ?  ? OT SHORT TERM GOAL #4  ? Title Patient will increase her RUE strength to 3/5 in order to complete tasks at shoulder level.   ? Time 6   ? Period Weeks   ? Status On-going   ?  ? OT SHORT TERM GOAL #5  ? Title Patient will derease her fascial restrictions to moderate amount in order to increase the functional mobility needed to complete reaching tasks to shoulder level.   ? Time 6   ? Period Weeks   ? Status On-going   ? ?  ?  ? ?  ? ? ? ? OT Long Term Goals - 04/11/21 1019   ? ?  ? OT LONG TERM GOAL #1  ? Title Patient will increase her RUE A/ROM to WNL in order to reach overhead and/or behind her back with less difficulty.   ? Time 12   ? Period Weeks   ? Status On-going   ? Target Date 07/02/21   ?  ? OT LONG TERM GOAL #2  ? Title Patient will increase her RUE strength to 5/5 in order to be able to return to work and begin lifting and moving packages of at least 15-20lbs with less difficulty.   ? Time 12   ? Period Weeks   ? Status On-going   ?  ? OT LONG TERM GOAL #3  ? Title Patient will report a pain level of approximately 2/10 in her RUE while completing light housekeeping tasks.   ? Time 12   ? Period Weeks   ? Status On-going   ?  ? OT LONG TERM GOAL #4  ? Title Patient will decrease her RUE fascial restrictions to min amount or less in order to increase the functional mobility needed to complete overhead reaching tasks.   ? Time 12   ? Period Weeks   ?  Status On-going   ? ?  ?  ? ?   ? ? ? ? ? ? ? ? Plan - 04/26/21 0937   ? ? Clinical Impression Statement A: Pt reports pain has improved since last session, did put sling back on to sleep.. Continued with myofascial release to address fascial restrictions, min restrictions palpated today. Continued with P/ROM and AA/ROM, pt demonstrating ROM WFL with exception of er.  Instructed pt to complete HEP in standing now as well. Verbal cuing for form and technique.   ? Body Structure / Function / Physical Skills ADL;UE functional use;Fascial restriction;Pain;ROM;Strength;Edema;IADL;Decreased knowledge of precautions;Mobility   ? Plan P: Continue with AA/ROM, add pulleys and PVC pipe slide; measure for MD appt   ? OT Home Exercise Plan eval: table slide, A/ROM elbow, forearm, wrist, pendulum 2/23: AA/ROM   ? ?  ?  ? ?  ? ? ?Patient will benefit from skilled therapeutic intervention in order to improve the following deficits and impairments:   ?Body Structure / Function / Physical Skills: ADL, UE functional use, Fascial restriction, Pain, ROM, Strength, Edema, IADL, Decreased knowledge of precautions, Mobility ?  ?  ? ? ?Visit Diagnosis: ?Stiffness of right shoulder, not elsewhere classified ? ?Other symptoms and signs involving the musculoskeletal system ? ?Acute pain of right shoulder ? ? ? ?Problem List ?Patient Active Problem List  ? Diagnosis Date Noted  ? Tenderness of female pelvic organs 03/08/2020  ? Vaginal discharge 03/08/2020  ? Vaginal odor 03/08/2020  ? History of IBS 10/26/2019  ? Constipation 10/26/2019  ? LLQ pain 10/26/2019  ? Acute bilateral low back pain without sciatica 10/26/2019  ? History of gestational diabetes 11/02/2018  ? IBS (irritable bowel syndrome) 10/25/2015  ? BV (bacterial vaginosis) 01/18/2014  ? ? ?Ezra Sites, OTR/L  ?[phone] ?04/26/2021, 9:46 AM ? ?[place] ?[facility] ?[address] ?[place], [place], [address] ?Phone: [phone]   Fax:  [phone] ? ?Name: RAYLA PEMBER ?MRN: [id number] ?Date of Birth: [birth year] ? ?

## 2021-04-29 ENCOUNTER — Other Ambulatory Visit: Payer: Self-pay

## 2021-04-29 ENCOUNTER — Encounter (HOSPITAL_COMMUNITY): Payer: Self-pay

## 2021-04-29 ENCOUNTER — Ambulatory Visit (HOSPITAL_COMMUNITY): Payer: Medicaid Other

## 2021-04-29 DIAGNOSIS — M25611 Stiffness of right shoulder, not elsewhere classified: Secondary | ICD-10-CM

## 2021-04-29 DIAGNOSIS — R29898 Other symptoms and signs involving the musculoskeletal system: Secondary | ICD-10-CM

## 2021-04-29 DIAGNOSIS — M25511 Pain in right shoulder: Secondary | ICD-10-CM

## 2021-04-29 NOTE — Therapy (Signed)
[facility] [address] [place], [place], [address] Phone: [phone]   Fax:  [phone]  Occupational Therapy Treatment  Patient Details  Name: [name] MRN: [id number] Date of Birth: 03/31/1985 Referring Provider (OT): Thane Edu, MD   Encounter Date: 04/29/2021   OT End of Session - 04/29/21 1355     Visit Number 7    Number of Visits 24    Date for OT Re-Evaluation 07/02/21   mini reassess: 05/07/21   Authorization Type [employer]    Authorization Time Period 27 visits combined OT/PT/SLP Reminder: does not cover ES unattended, ionto, hot/col packs, [place]. [employer] has approved 1 re-eval and 15 treatment visits (04/11/21-05/20/21)    Authorization - Visit Number 6    Authorization - Number of Visits 15    OT Start Time 1120    OT Stop Time 1200    OT Time Calculation (min) 40 min    Activity Tolerance Patient tolerated treatment well    Behavior During Therapy [facility] for tasks assessed/performed             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/31/2013   recurrant    IBS (irritable bowel syndrome)    with constipation   Shoulder dislocation, recurrent     Past Surgical History:  Procedure Laterality Date   DILATION AND CURETTAGE OF UTERUS  10/11/2017   incomplete abortion   DILATION AND EVACUATION N/A 10/11/2017   Procedure: suction dilation and evacuation;  Surgeon: Conan Bowens, MD;  Location: [facility];  Service: Gynecology;  Laterality: N/A;   SHOULDER ARTHROSCOPY WITH LABRAL REPAIR Right 03/21/2021   Procedure: SHOULDER ARTHROSCOPY WITH LABRAL REPAIR;  Surgeon: Oliver Barre, MD;  Location: [facility];  Service: Orthopedics;  Laterality: Right;   TUBAL LIGATION Bilateral 02/21/2017   pt decided to not have this done   TUBAL LIGATION Bilateral 01/08/2019   Procedure: POST PARTUM TUBAL LIGATION;  Surgeon: Covington Bing,  MD;  Location: [facility];  Service: Gynecology;  Laterality: Bilateral;   WISDOM TOOTH EXTRACTION      There were no vitals filed for this visit.   Subjective Assessment - 04/29/21 1141     Subjective  S: Friday I did have some soreness for a little bit.    Currently in Pain? No/denies                [facility] OT Assessment - 04/29/21 1123       Assessment   Medical Diagnosis s/p right shoulder anterior stabilization      Precautions   Precautions Shoulder    Type of Shoulder Precautions Follow Athroscopic Shoulder Stabilization Protocol. Procotol in media session.      ROM / Strength   AROM / PROM / Strength AROM;Strength;PROM      Palpation   Palpation comment min fascial restrictions noted in right upper arm, upper trapezius, and scapularis region.   previous:max fascial restrictions     AROM   Overall AROM  Unable to assess;Due to precautions      PROM   Overall PROM Comments Assessed supine. IR/er adducted    PROM Assessment Site Shoulder    Right/Left Shoulder Right    Right Shoulder Flexion 125 Degrees   previous: 60   Right Shoulder ABduction 145 Degrees   previous: 45   Right Shoulder Internal Rotation 90 Degrees  previous: same   Right Shoulder External Rotation 20 Degrees   previous: 0     Strength   Overall Strength Unable to assess;Due to precautions                      OT Treatments/Exercises (OP) - 04/29/21 1142       Exercises   Exercises Shoulder      Shoulder Exercises: Supine   Protraction PROM;5 reps;AAROM;12 reps    Horizontal ABduction PROM;5 reps;AAROM;12 reps    External Rotation PROM;5 reps;AAROM;12 reps    Internal Rotation PROM;5 reps;AAROM;12 reps    Flexion PROM;5 reps;AAROM;12 reps    ABduction PROM;5 reps;AAROM;12 reps      Shoulder Exercises: Pulleys   Flexion 1 minute   standing   ABduction 1 minute   standing     Manual Therapy   Manual Therapy Myofascial release    Manual therapy comments Manual  therapy completed prior to exercises.    Myofascial Release Myofascial release and manual stretching completed to right upper arm, upper trapezius, scapularis region and elbow to decrease fascial restrictions and increase joint mobility in a pain free zone.                      OT Short Term Goals - 04/11/21 1019       OT SHORT TERM GOAL #1   Title Patient will be educated and independent with HEP in order to faciliate her progress in therapy and begin using her RUE as her dominant extremity for 50% of daily tasks.    Time 6    Period Weeks    Status On-going    Target Date 05/21/21      OT SHORT TERM GOAL #2   Title Patient will increase her RUE P/ROM to [facility] in order to increase her ability to complete bathing and dressing tasks with less difficulty.    Time 6    Period Weeks    Status On-going      OT SHORT TERM GOAL #3   Title Patient will reports a decrease in RUE pain of approximately 5/10 or less when using her UE to complete waist level tasks.    Time 6    Period Weeks    Status On-going      OT SHORT TERM GOAL #4   Title Patient will increase her RUE strength to 3/5 in order to complete tasks at shoulder level.    Time 6    Period Weeks    Status On-going      OT SHORT TERM GOAL #5   Title Patient will derease her fascial restrictions to moderate amount in order to increase the functional mobility needed to complete reaching tasks to shoulder level.    Time 6    Period Weeks    Status On-going               OT Long Term Goals - 04/11/21 1019       OT LONG TERM GOAL #1   Title Patient will increase her RUE A/ROM to WNL in order to reach overhead and/or behind her back with less difficulty.    Time 12    Period Weeks    Status On-going    Target Date 07/02/21      OT LONG TERM GOAL #2   Title Patient will increase her RUE strength to 5/5 in order to be able to return to work and begin lifting  and moving packages of at least 15-20lbs with less  difficulty.    Time 12    Period Weeks    Status On-going      OT LONG TERM GOAL #3   Title Patient will report a pain level of approximately 2/10 in her RUE while completing light housekeeping tasks.    Time 12    Period Weeks    Status On-going      OT LONG TERM GOAL #4   Title Patient will decrease her RUE fascial restrictions to min amount or less in order to increase the functional mobility needed to complete overhead reaching tasks.    Time 12    Period Weeks    Status On-going                   Plan - 04/29/21 1357     Clinical Impression Statement A: Measurements taken for MD  appointment. Patient increased supine AA/ROM repetitions. completed pulleys while adding abduction. VC for form and technique.    Body Structure / Function / Physical Skills ADL;UE functional use;Fascial restriction;Pain;ROM;Strength;Edema;IADL;Decreased knowledge of precautions;Mobility    Plan P: Follow up on MD appointment. Add PVC pipe slide and wall wash. Update HEP to complete AA/ROM standing.    Consulted and Agree with Plan of Care Patient             Patient will benefit from skilled therapeutic intervention in order to improve the following deficits and impairments:   Body Structure / Function / Physical Skills: ADL, UE functional use, Fascial restriction, Pain, ROM, Strength, Edema, IADL, Decreased knowledge of precautions, Mobility       Visit Diagnosis: Stiffness of right shoulder, not elsewhere classified  Acute pain of right shoulder  Other symptoms and signs involving the musculoskeletal system    Problem List Patient Active Problem List   Diagnosis Date Noted   Tenderness of female pelvic organs 03/08/2020   Vaginal discharge 03/08/2020   Vaginal odor 03/08/2020   History of IBS 10/26/2019   Constipation 10/26/2019   LLQ pain 10/26/2019   Acute bilateral low back pain without sciatica 10/26/2019   History of gestational diabetes 11/02/2018   IBS  (irritable bowel syndrome) 10/25/2015   BV (bacterial vaginosis) 01/18/2014   [name], OTR/L,CBIS  [phone]  04/29/2021, 2:01 PM  [facility] [address] [place], [place], [address] Phone: [phone]   Fax:  [phone]  Name: [name] MRN: [id number] Date of Birth: Nov 07, 1985

## 2021-04-30 ENCOUNTER — Encounter: Payer: Self-pay | Admitting: Orthopedic Surgery

## 2021-04-30 ENCOUNTER — Ambulatory Visit: Payer: Medicaid Other | Admitting: Orthopedic Surgery

## 2021-04-30 VITALS — Ht 64.0 in | Wt 163.0 lb

## 2021-04-30 DIAGNOSIS — M24411 Recurrent dislocation, right shoulder: Secondary | ICD-10-CM

## 2021-04-30 NOTE — Progress Notes (Signed)
Orthopaedic Postop Note ? ?Assessment: ?[name] is a 36 y.o. female s/p right shoulder anterior stabilization for chronic instability ? ?DOS: 03/21/2021 ? ?Plan: ?[name] is recovering well.  Her range of motion has improved.  She is working well with physical therapy.  She takes occasional Tylenol for pain.  I am pleased with her progress today.  She should continue to increase her range of motion and strength per the protocol.  We will see her back in approximately 6 weeks. ? ?Follow-up: ?Return in about 6 weeks (around 06/11/2021). ?XR at next visit: None ? ?Subjective: ? ?Chief Complaint  ?Patient presents with  ? Routine Post Op  ?  Rt shoulder DOS 03/21/21  ? ? ?History of Present Illness: ?[name] is a 36 y.o. female who presents following the above stated procedure.  Surgery was approximately 6 weeks ago.  She reports some slight discomfort at the surgical incisions.  Otherwise, she is working with physical therapy.  Her range of motion is improving.  She has minimal pain, taking Tylenol rarely.  She has occasional apprehension, but it is not on a regular basis. ? ?Review of Systems: ?No fevers or chills ?No numbness or tingling ?No Chest Pain ?No shortness of breath ? ? ?Objective: ?Ht 5\' 4"  (1.626 m)   Wt 163 lb (73.9 kg)   BMI 27.98 kg/m?  ? ?Physical Exam: ? ?Alert and oriented.  No acute distress. ? ?Surgical incisions healing well.  No surrounding erythema or drainage.  Mild tenderness to palpation directly over the incisions.  130 degrees of active forward flexion.  Active internal rotation to her lumbar spine.  Passive external rotation to 15 degrees.  Passive abduction to 90 degrees.  Fingers are warm and well-perfused.  Sensation is intact throughout the right upper extremity. ? ?IMAGING: ?I personally ordered and reviewed the following images: ? ?No new imaging obtained today. ? ? ?Mordecai Rasmussen, MD ?04/30/2021 ?10:02 PM ? ? ?

## 2021-05-01 ENCOUNTER — Ambulatory Visit (HOSPITAL_COMMUNITY): Payer: Medicaid Other

## 2021-05-01 ENCOUNTER — Other Ambulatory Visit: Payer: Self-pay

## 2021-05-01 ENCOUNTER — Encounter (HOSPITAL_COMMUNITY): Payer: Self-pay

## 2021-05-01 DIAGNOSIS — M25611 Stiffness of right shoulder, not elsewhere classified: Secondary | ICD-10-CM | POA: Diagnosis not present

## 2021-05-01 DIAGNOSIS — M25511 Pain in right shoulder: Secondary | ICD-10-CM

## 2021-05-01 DIAGNOSIS — R29898 Other symptoms and signs involving the musculoskeletal system: Secondary | ICD-10-CM

## 2021-05-01 NOTE — Therapy (Signed)
[facility] [address] [place], [place], [address] Phone: [phone]   Fax:  [phone]  Occupational Therapy Treatment  Patient Details  Name: [name] MRN: [id number] Date of Birth: 08/02/1985 Referring Provider (OT): Thane EduMark Cairns, MD   Encounter Date: 05/01/2021   OT End of Session - 05/01/21 1104     Visit Number 8    Number of Visits 24    Date for OT Re-Evaluation 07/02/21   mini reassess: 05/07/21   Authorization Type [employer]    Authorization Time Period 27 visits combined OT/PT/SLP Reminder: does not cover ES unattended, ionto, hot/col packs, [place]. [employer] has approved 1 re-eval and 15 treatment visits (04/11/21-05/20/21)    Authorization - Visit Number 7    Authorization - Number of Visits 15    OT Start Time 1033    OT Stop Time 1111    OT Time Calculation (min) 38 min    Activity Tolerance Patient tolerated treatment well    Behavior During Therapy WFL for tasks assessed/performed             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/31/2013   recurrant    IBS (irritable bowel syndrome)    with constipation   Shoulder dislocation, recurrent     Past Surgical History:  Procedure Laterality Date   DILATION AND CURETTAGE OF UTERUS  10/11/2017   incomplete abortion   DILATION AND EVACUATION N/A 10/11/2017   Procedure: suction dilation and evacuation;  Surgeon: Conan Bowensavis, Kelly M, MD;  Location: [facility];  Service: Gynecology;  Laterality: N/A;   SHOULDER ARTHROSCOPY WITH LABRAL REPAIR Right 03/21/2021   Procedure: SHOULDER ARTHROSCOPY WITH LABRAL REPAIR;  Surgeon: Oliver Barreairns, Mark A, MD;  Location: [facility];  Service: Orthopedics;  Laterality: Right;   TUBAL LIGATION Bilateral 02/21/2017   pt decided to not have this done   TUBAL LIGATION Bilateral 01/08/2019   Procedure: POST PARTUM TUBAL LIGATION;  Surgeon: [place] BingPickens, Charlie,  MD;  Location: [facility];  Service: Gynecology;  Laterality: Bilateral;   WISDOM TOOTH EXTRACTION      There were no vitals filed for this visit.   Subjective Assessment - 05/01/21 1040     Subjective  S: It might be the way I'm sleeping that is making it hurt.    Currently in Pain? Yes    Pain Score 5     Pain Location Elbow    Pain Orientation Right    Pain Descriptors / Indicators Aching    Pain Type Acute pain    Pain Onset Yesterday    Pain Frequency Constant    Aggravating Factors  unknown. maybe the way she is trying to sleep    Pain Relieving Factors pain medication as needed    Effect of Pain on Daily Activities max effect    Multiple Pain Sites No                [facility] OT Assessment - 05/01/21 1102       Assessment   Medical Diagnosis s/p right shoulder anterior stabilization    Next MD Visit 06/11/21      Precautions   Precautions Shoulder    Type of Shoulder Precautions Follow Athroscopic Shoulder Stabilization Protocol. Procotol in media session.  OT Treatments/Exercises (OP) - 05/01/21 1102       Exercises   Exercises Shoulder      Shoulder Exercises: Supine   Protraction PROM;5 reps    Horizontal ABduction PROM;5 reps    External Rotation PROM;5 reps    Internal Rotation PROM;5 reps    Flexion PROM;5 reps    ABduction PROM;5 reps      Shoulder Exercises: Standing   Protraction AAROM;12 reps    Horizontal ABduction AAROM;12 reps    External Rotation AAROM;12 reps    Internal Rotation AAROM;12 reps    Flexion AAROM;12 reps    ABduction AAROM;12 reps      Shoulder Exercises: ROM/Strengthening   Wall Wash 1'    Other ROM/Strengthening Exercises PVC pipe 10X      Manual Therapy   Manual Therapy Myofascial release    Manual therapy comments Manual therapy completed prior to exercises.    Myofascial Release Myofascial release and manual stretching completed to right upper arm, upper trapezius, scapularis  region and elbow to decrease fascial restrictions and increase joint mobility in a pain free zone.                    OT Education - 05/01/21 1104     Education Details Complete AA/ROM standing now    Person(s) Educated Patient    Methods Explanation    Comprehension Verbalized understanding              OT Short Term Goals - 04/11/21 1019       OT SHORT TERM GOAL #1   Title Patient will be educated and independent with HEP in order to faciliate her progress in therapy and begin using her RUE as her dominant extremity for 50% of daily tasks.    Time 6    Period Weeks    Status On-going    Target Date 05/21/21      OT SHORT TERM GOAL #2   Title Patient will increase her RUE P/ROM to [facility] in order to increase her ability to complete bathing and dressing tasks with less difficulty.    Time 6    Period Weeks    Status On-going      OT SHORT TERM GOAL #3   Title Patient will reports a decrease in RUE pain of approximately 5/10 or less when using her UE to complete waist level tasks.    Time 6    Period Weeks    Status On-going      OT SHORT TERM GOAL #4   Title Patient will increase her RUE strength to 3/5 in order to complete tasks at shoulder level.    Time 6    Period Weeks    Status On-going      OT SHORT TERM GOAL #5   Title Patient will derease her fascial restrictions to moderate amount in order to increase the functional mobility needed to complete reaching tasks to shoulder level.    Time 6    Period Weeks    Status On-going               OT Long Term Goals - 04/11/21 1019       OT LONG TERM GOAL #1   Title Patient will increase her RUE A/ROM to WNL in order to reach overhead and/or behind her back with less difficulty.    Time 12    Period Weeks    Status On-going    Target Date 07/02/21  OT LONG TERM GOAL #2   Title Patient will increase her RUE strength to 5/5 in order to be able to return to work and begin lifting and moving  packages of at least 15-20lbs with less difficulty.    Time 12    Period Weeks    Status On-going      OT LONG TERM GOAL #3   Title Patient will report a pain level of approximately 2/10 in her RUE while completing light housekeeping tasks.    Time 12    Period Weeks    Status On-going      OT LONG TERM GOAL #4   Title Patient will decrease her RUE fascial restrictions to min amount or less in order to increase the functional mobility needed to complete overhead reaching tasks.    Time 12    Period Weeks    Status On-going                   Plan - 05/01/21 1105     Clinical Impression Statement A: Myofascial release completed to address restrictions in right upper arm and upper trapezius. Added wall wash and PVC pipe slide with VC for form and technique. Increased repetitions with standing AA/ROM to 12.    Body Structure / Function / Physical Skills ADL;UE functional use;Fascial restriction;Pain;ROM;Strength;Edema;IADL;Decreased knowledge of precautions;Mobility    Plan P: Pulleys. prone extension. proximal shoulder strengthening supine.    Consulted and Agree with Plan of Care Patient             Patient will benefit from skilled therapeutic intervention in order to improve the following deficits and impairments:   Body Structure / Function / Physical Skills: ADL, UE functional use, Fascial restriction, Pain, ROM, Strength, Edema, IADL, Decreased knowledge of precautions, Mobility       Visit Diagnosis: Acute pain of right shoulder  Other symptoms and signs involving the musculoskeletal system  Stiffness of right shoulder, not elsewhere classified    Problem List Patient Active Problem List   Diagnosis Date Noted   Tenderness of female pelvic organs 03/08/2020   Vaginal discharge 03/08/2020   Vaginal odor 03/08/2020   History of IBS 10/26/2019   Constipation 10/26/2019   LLQ pain 10/26/2019   Acute bilateral low back pain without sciatica 10/26/2019    History of gestational diabetes 11/02/2018   IBS (irritable bowel syndrome) 10/25/2015   BV (bacterial vaginosis) 01/18/2014   Limmie Patricia, OTR/L,CBIS  [phone]  05/01/2021, 2:27 PM  [facility] [address] [place], [place], [address] Phone: [phone]   Fax:  [phone]  Name: [name] MRN: [id number] Date of Birth: 1985-04-09

## 2021-05-06 ENCOUNTER — Other Ambulatory Visit: Payer: Self-pay

## 2021-05-06 ENCOUNTER — Ambulatory Visit (HOSPITAL_COMMUNITY): Payer: Medicaid Other | Admitting: Occupational Therapy

## 2021-05-06 DIAGNOSIS — M25511 Pain in right shoulder: Secondary | ICD-10-CM

## 2021-05-06 DIAGNOSIS — M25611 Stiffness of right shoulder, not elsewhere classified: Secondary | ICD-10-CM | POA: Diagnosis not present

## 2021-05-06 DIAGNOSIS — R29898 Other symptoms and signs involving the musculoskeletal system: Secondary | ICD-10-CM

## 2021-05-06 NOTE — Therapy (Signed)
OUTPATIENT OCCUPATIONAL THERAPY TREATMENT NOTE   Patient Name: [name] MRN: [id number] DOB:Jun 16, 1985, 36 y.o., female Today's Date: 05/06/2021  PCP: Deneise Lever, NP REFERRING PROVIDER: Health, Matthew Folks*   OT End of Session - 05/06/21 1156     Visit Number 9    Number of Visits 24    Date for OT Re-Evaluation 07/02/21   mini reassess: 05/07/21   Authorization Type [employer]    Authorization Time Period 27 visits combined OT/PT/SLP Reminder: does not cover ES unattended, ionto, hot/col packs, [place]. [employer] has approved 1 re-eval and 15 treatment visits (04/11/21-05/20/21)    Authorization - Visit Number 8    Authorization - Number of Visits 15    OT Start Time 1120    OT Stop Time 1202    OT Time Calculation (min) 42 min    Activity Tolerance Patient tolerated treatment well    Behavior During Therapy [facility] for tasks assessed/performed             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/31/2013   recurrant    IBS (irritable bowel syndrome)    with constipation   Shoulder dislocation, recurrent    Past Surgical History:  Procedure Laterality Date   DILATION AND CURETTAGE OF UTERUS  10/11/2017   incomplete abortion   DILATION AND EVACUATION N/A 10/11/2017   Procedure: suction dilation and evacuation;  Surgeon: Conan Bowens, MD;  Location: [facility];  Service: Gynecology;  Laterality: N/A;   SHOULDER ARTHROSCOPY WITH LABRAL REPAIR Right 03/21/2021   Procedure: SHOULDER ARTHROSCOPY WITH LABRAL REPAIR;  Surgeon: Oliver Barre, MD;  Location: [facility];  Service: Orthopedics;  Laterality: Right;   TUBAL LIGATION Bilateral 02/21/2017   pt decided to not have this done   TUBAL LIGATION Bilateral 01/08/2019   Procedure: POST PARTUM TUBAL LIGATION;  Surgeon: Pulcifer Bing, MD;  Location: [facility];  Service: Gynecology;  Laterality: Bilateral;   WISDOM TOOTH  EXTRACTION     Patient Active Problem List   Diagnosis Date Noted   Tenderness of female pelvic organs 03/08/2020   Vaginal discharge 03/08/2020   Vaginal odor 03/08/2020   History of IBS 10/26/2019   Constipation 10/26/2019   LLQ pain 10/26/2019   Acute bilateral low back pain without sciatica 10/26/2019   History of gestational diabetes 11/02/2018   IBS (irritable bowel syndrome) 10/25/2015   BV (bacterial vaginosis) 01/18/2014    ONSET DATE: 03/21/21  REFERRING DIAG: s/p right shoulder arthroscopic anterior stabilization   THERAPY DIAG:  Acute pain of right shoulder  Other symptoms and signs involving the musculoskeletal system  Stiffness of right shoulder, not elsewhere classified   PERTINENT HISTORY: Patient is a 36 y/o female S/P right shoulder arthroscopic anterior stabilization completed 03/21/21 by Dr. Dallas Schimke due to frequent shoulder dislocations.   PRECAUTIONS: Follow Athroscopic Shoulder Stabilization Protocol. Procotol in media session.   SUBJECTIVE: S: I rolled over on it last night and it was sore after that.    PAIN:  Are you having pain? No     OBJECTIVE:   TODAY'S TREATMENT:  -Myofascial release: completed separately from therapeutic exercises. Myofascial release and manual stretching completed to right upper arm, upper trapezius, scapularis region and elbow to decrease fascial restrictions and increase joint mobility in a pain free zone.  -P/ROM shoulder flexion, horizontal ab/adduction, er/IR, abduction, 5X each -AA/ROM shoulder protraction, flexion, horizontal  ab/adduction, er/IR, abduction 15X each supine; 10X each standing -Proximal shoulder strengthening in supine, 10X each no rest breaks -Prone extension: 10X -Pulleys, 1' flexion 1' abduction     OT Short Term Goals      OT SHORT TERM GOAL #1   Title Patient will be educated and independent with HEP in order to faciliate her progress in therapy and begin using her RUE as her dominant  extremity for 50% of daily tasks.    Time 6    Period Weeks    Status On-going    Target Date 05/21/21      OT SHORT TERM GOAL #2   Title Patient will increase her RUE P/ROM to [facility] in order to increase her ability to complete bathing and dressing tasks with less difficulty.    Time 6    Period Weeks    Status On-going      OT SHORT TERM GOAL #3   Title Patient will reports a decrease in RUE pain of approximately 5/10 or less when using her UE to complete waist level tasks.    Time 6    Period Weeks    Status On-going      OT SHORT TERM GOAL #4   Title Patient will increase her RUE strength to 3/5 in order to complete tasks at shoulder level.    Time 6    Period Weeks    Status On-going      OT SHORT TERM GOAL #5   Title Patient will derease her fascial restrictions to moderate amount in order to increase the functional mobility needed to complete reaching tasks to shoulder level.    Time 6    Period Weeks    Status On-going              OT Long Term Goals      OT LONG TERM GOAL #1   Title Patient will increase her RUE A/ROM to WNL in order to reach overhead and/or behind her back with less difficulty.    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    Time 12    Period Weeks    Status On-going      OT LONG TERM GOAL #3   Title Patient will report a pain level of approximately 2/10 in her RUE while completing light housekeeping tasks.    Time 12    Period Weeks    Status On-going      OT LONG TERM GOAL #4   Title Patient will decrease her RUE fascial restrictions to min amount or less in order to increase the functional mobility needed to complete overhead reaching tasks.    Time 12    Period Weeks    Status On-going               Plan - 05/06/21 1138     Clinical Impression Statement A: Myofascial release completed to address restrictions in right upper arm and upper trapezius. Continued with P/ROM in supine, progressed to 15 repetitions for AA/ROM in supine. Added proximal shoulder strengthening in supine, prone extension, and pulleys. Verbal cuing for form and technique.    Body Structure / Function /  Physical Skills ADL;UE functional use;Fascial restriction;Pain;ROM;Strength;Edema;IADL;Decreased knowledge of precautions;Mobility    Plan P: Mini-reassessment. Trial subscapularis release, teres manual techniques to improve er, complete AA/ROM in standing, wall wash    OT Home Exercise Plan eval: table slide, A/ROM elbow, forearm, wrist, pendulum 2/23: AA/ROM    Consulted and Agree with Plan of Care Patient              Ezra Sites, OTR/L  [phone] 05/06/2021, 12:07 PM

## 2021-05-09 ENCOUNTER — Encounter (HOSPITAL_COMMUNITY): Payer: Self-pay | Admitting: Occupational Therapy

## 2021-05-09 ENCOUNTER — Other Ambulatory Visit: Payer: Self-pay

## 2021-05-09 ENCOUNTER — Ambulatory Visit (HOSPITAL_COMMUNITY): Payer: Medicaid Other | Admitting: Occupational Therapy

## 2021-05-09 DIAGNOSIS — M25611 Stiffness of right shoulder, not elsewhere classified: Secondary | ICD-10-CM | POA: Diagnosis not present

## 2021-05-09 NOTE — Therapy (Signed)
?OUTPATIENT OCCUPATIONAL THERAPY TREATMENT NOTE ? ? ?Patient Name: [name] ?MRN: [id number] ?DOB:[birth year], 36 y.o., female ?Today's Date: 05/09/2021 ? ?PCP: [facility], Trenton Gammon, NP ?REFERRING PROVIDER: Dr. Wyvonna Plum ? ? OT End of Session - 05/09/21 1206   ? ? Visit Number 10   ? Number of Visits 24   ? Date for OT Re-Evaluation 07/02/21   ? Authorization Type [employer] [facility] Medicaid   ? Authorization Time Period 27 visits combined OT/PT/SLP Reminder: does not cover ES unattended, ionto, hot/col packs, [place]. [employer] has approved 1 re-eval and 15 treatment visits (04/11/21-05/20/21)   ? Authorization - Visit Number 9   ? Authorization - Number of Visits 15   ? OT Start Time 5361   pt arrived late  ? OT Stop Time 1200   ? OT Time Calculation (min) 29 min   ? Activity Tolerance Patient tolerated treatment well   ? Behavior During Therapy [facility] for tasks assessed/performed   ? ?  ?  ? ?  ? ? ? ?Past Medical History:  ?Diagnosis Date  ? Anemia   ? Blood transfusion without reported diagnosis   ? Dislocated shoulder 03/31/2013  ? recurrant   ? IBS (irritable bowel syndrome)   ? with constipation  ? Shoulder dislocation, recurrent   ? ?Past Surgical History:  ?Procedure Laterality Date  ? DILATION AND CURETTAGE OF UTERUS  10/11/2017  ? incomplete abortion  ? DILATION AND EVACUATION N/A 10/11/2017  ? Procedure: suction dilation and evacuation;  Surgeon: Sloan Leiter, MD;  Location: [facility];  Service: Gynecology;  Laterality: N/A;  ? SHOULDER ARTHROSCOPY WITH LABRAL REPAIR Right 03/21/2021  ? Procedure: SHOULDER ARTHROSCOPY WITH LABRAL REPAIR;  Surgeon: Mordecai Rasmussen, MD;  Location: [facility];  Service: Orthopedics;  Laterality: Right;  ? TUBAL LIGATION Bilateral 02/21/2017  ? pt decided to not have this done  ? TUBAL LIGATION Bilateral 01/08/2019  ? Procedure: POST PARTUM TUBAL LIGATION;  Surgeon: Aletha Halim, MD;  Location: [facility];  Service: Gynecology;  Laterality: Bilateral;  ? WISDOM TOOTH EXTRACTION     ? ?Patient Active Problem List  ? Diagnosis Date Noted  ? Tenderness of female pelvic organs 03/08/2020  ? Vaginal discharge 03/08/2020  ? Vaginal odor 03/08/2020  ? History of IBS 10/26/2019  ? Constipation 10/26/2019  ? LLQ pain 10/26/2019  ? Acute bilateral low back pain without sciatica 10/26/2019  ? History of gestational diabetes 11/02/2018  ? IBS (irritable bowel syndrome) 10/25/2015  ? BV (bacterial vaginosis) 01/18/2014  ? ? ?ONSET DATE: 03/21/21 ? ?REFERRING DIAG: s/p right shoulder arthroscopic anterior stabilization  ? ?THERAPY DIAG:  ?Acute pain of right shoulder ? ?Other symptoms and signs involving the musculoskeletal system ? ?Stiffness of right shoulder, not elsewhere classified ? ? ?PERTINENT HISTORY: Patient is a 36 y/o female S/P right shoulder arthroscopic anterior stabilization completed 03/21/21 by Dr. Amedeo Kinsman due to frequent shoulder dislocations.  ? ?PRECAUTIONS: Follow Athroscopic Shoulder Stabilization Protocol. Procotol in media session.  ? ?SUBJECTIVE: S: I'm still having trouble navigating this one. (Er)  ? ?PAIN:  ?Are you having pain? No ? ? ? ? ?OBJECTIVE:  ? ?TODAY'S TREATMENT:  ?-Manual technique-subscapularis and teres minor release, muscle energy technique to right external rotators ?-P/ROM shoulder flexion, horizontal ab/adduction, er/IR, abduction, 5X each ?-AA/ROM shoulder protraction, flexion, horizontal ab/adduction, er/IR, abduction 10X each standing ?-Wall wash, 1'  ?-Pulley's 2' flexion ? ? ?05/06/21 ?-Myofascial release: completed separately from therapeutic exercises. Myofascial release and manual stretching completed  to right upper arm, upper trapezius, scapularis region and elbow to decrease fascial restrictions and increase joint mobility in a pain free zone.  ?-P/ROM shoulder flexion, horizontal ab/adduction, er/IR, abduction, 5X each ?-AA/ROM shoulder protraction, flexion, horizontal ab/adduction, er/IR, abduction 15X each supine; 10X each standing ?-Proximal shoulder  strengthening in supine, 10X each no rest breaks ?-Prone extension: 10X ?-Pulleys, 1' flexion 1' abduction ? ? ? ? ? 05/09/21 1135  ?Assessment  ?Medical Diagnosis s/p right shoulder anterior stabilization  ?Precautions  ?Precautions Shoulder  ?Type of Shoulder Precautions Follow Athroscopic Shoulder Stabilization Protocol. Procotol in media session.  ?Palpation  ?Palpation comment min fascial restrictions noted in right upper arm, upper trapezius, and scapularis region.  ?AROM  ?Right/Left Shoulder Right  ?Right Shoulder ABduction 180 Degrees ?(not previously assessed)  ?Right Shoulder External Rotation 29 Degrees ?(not previously assessed)  ?Right Shoulder Internal Rotation 90 Degrees ?(not previously assessed)  ?Right Shoulder Flexion 145 Degrees ?(not previously assessed)  ?Overall AROM Comments Assessed seated, er/IR adducted  ?AROM Assessment Site Shoulder  ?PROM  ?Right Shoulder Flexion 145 Degrees ?(125 previous)  ?Right Shoulder ABduction 180 Degrees ?(145 previous)  ?Right Shoulder Internal Rotation 90 Degrees ?(same as previous)  ?Right Shoulder External Rotation 25 Degrees ?(20 previous)  ?Overall PROM Comments Assessed supine. IR/er adducted  ?PROM Assessment Site Shoulder  ?Right/Left Shoulder Right  ?Strength  ?Right/Left Shoulder Right  ?Right Shoulder Flexion 5/5 ?(not previously assessed)  ?Right Shoulder ABduction 4+/5 ?(not previously assessed)  ?Right Shoulder External Rotation 4+/5 ?(not previously assessed)  ?Right Shoulder Internal Rotation 5/5 ?(not previously assessed)  ?Overall Strength Comments Assessed seated, er/IR adducted  ?Strength Assessment Site Shoulder  ? ? ? OT Short Term Goals  ? ?  ? OT SHORT TERM GOAL #1  ? Title Patient will be educated and independent with HEP in order to faciliate her progress in therapy and begin using her RUE as her dominant extremity for 50% of daily tasks.   ? Time 6   ? Period Weeks   ? Status On-going   ? Target Date 05/21/21   ?  ? OT SHORT TERM  GOAL #2  ? Title Patient will increase her RUE P/ROM to [facility] in order to increase her ability to complete bathing and dressing tasks with less difficulty.   ? Time 6   ? Period Weeks   ? Status On-going   ?  ? OT SHORT TERM GOAL #3  ? Title Patient will reports a decrease in RUE pain of approximately 5/10 or less when using her UE to complete waist level tasks.   ? Time 6   ? Period Weeks   ? Status On-going   ?  ? OT SHORT TERM GOAL #4  ? Title Patient will increase her RUE strength to 3/5 in order to complete tasks at shoulder level.   ? Time 6   ? Period Weeks   ? Status Achieved  ?  ? OT SHORT TERM GOAL #5  ? Title Patient will derease her fascial restrictions to moderate amount in order to increase the functional mobility needed to complete reaching tasks to shoulder level.   ? Time 6   ? Period Weeks   ? Status Achieved  ? ?  ?  ? ?  ? ? ? OT Long Term Goals  ? ?  ? OT LONG TERM GOAL #1  ? Title Patient will increase her RUE A/ROM to WNL in order to reach overhead and/or behind her back with  less difficulty.   ? Time 12   ? Period Weeks   ? Status On-going   ? Target Date 07/02/21   ?  ? OT LONG TERM GOAL #2  ? Title Patient will increase her RUE strength to 5/5 in order to be able to return to work and begin lifting and moving packages of at least 15-20lbs with less difficulty.   ? Time 12   ? Period Weeks   ? Status Partially Met   ?  ? OT LONG TERM GOAL #3  ? Title Patient will report a pain level of approximately 2/10 in her RUE while completing light housekeeping tasks.   ? Time 12   ? Period Weeks   ? Status On-going   ?  ? OT LONG TERM GOAL #4  ? Title Patient will decrease her RUE fascial restrictions to min amount or less in order to increase the functional mobility needed to complete overhead reaching tasks.   ? Time 12   ? Period Weeks   ? Status Achieved  ? ?  ?  ? ?  ? ? ? Plan   ? ? Clinical Impression Statement A: Pt arrived late to session therefore no myofascial release completed, did complete  subscapularis release and muscle energy technique to improve er, good results. Mini-reassessment completed today, pt demonstrates good progress with ROM and strength, functional use of RUE. Continued with AA/ROM

## 2021-05-09 NOTE — Telephone Encounter (Signed)
Noted  

## 2021-05-13 ENCOUNTER — Other Ambulatory Visit: Payer: Self-pay

## 2021-05-13 ENCOUNTER — Ambulatory Visit (HOSPITAL_COMMUNITY): Payer: Medicaid Other

## 2021-05-13 DIAGNOSIS — M25511 Pain in right shoulder: Secondary | ICD-10-CM

## 2021-05-13 DIAGNOSIS — R29898 Other symptoms and signs involving the musculoskeletal system: Secondary | ICD-10-CM

## 2021-05-13 DIAGNOSIS — M25611 Stiffness of right shoulder, not elsewhere classified: Secondary | ICD-10-CM | POA: Diagnosis not present

## 2021-05-13 NOTE — Patient Instructions (Signed)
?  Pectoralis and Anterior Shoulder Myofascial Release ? ?Place foam roll, myofascial release ball, lacrosse ball etc on the corner of wall or doorframe as shown and lean your chest and anterior shoulder into the ball. Roll side to side, up and down, diagonally to involved tissues as directed.  ? ? ? ?WALL EXTERNAL ROTATION STRETCH - ER ? ?Start by standing in a doorway or at the corner of a wall and place your hand on the wall with your elbow bent as shown. Next, gently turn your body the opposite direction causing your shoulder to externally rotate until a stretch is felt. Hold, return to starting position and repeat.  Hold for 20-30 seconds Complete 2 times ? ? ?

## 2021-05-13 NOTE — Therapy (Signed)
? ?  ? ?  ?OUTPATIENT OCCUPATIONAL THERAPY TREATMENT NOTE ? ? ?Patient Name: [name] ?MRN: [id number] ?DOB:[birth year], 36 y.o., female ?Today's Date: 05/13/2021 ? ?PCP: [facility], Trenton Gammon, NP ?REFERRING PROVIDER: Dr. Wyvonna Plum ? ? OT End of Session - 05/13/21 1209   ? ? Visit Number 11   ? Number of Visits 24   ? Date for OT Re-Evaluation 07/02/21   ? Authorization Type [employer] [facility] Medicaid   ? Authorization Time Period 27 visits combined OT/PT/SLP Reminder: does not cover ES unattended, ionto, hot/col packs, [place]. [employer] has approved 1 re-eval and 15 treatment visits (04/11/21-05/20/21)   ? Authorization - Visit Number 10   ? Authorization - Number of Visits 15   ? OT Start Time 1115   ? OT Stop Time 1153   ? OT Time Calculation (min) 38 min   ? Activity Tolerance Patient tolerated treatment well   ? Behavior During Therapy [facility] for tasks assessed/performed   ? ?  ?  ? ?  ? ? ? ? ?Past Medical History:  ?Diagnosis Date  ? Anemia   ? Blood transfusion without reported diagnosis   ? Dislocated shoulder 03/31/2013  ? recurrant   ? IBS (irritable bowel syndrome)   ? with constipation  ? Shoulder dislocation, recurrent   ? ?Past Surgical History:  ?Procedure Laterality Date  ? DILATION AND CURETTAGE OF UTERUS  10/11/2017  ? incomplete abortion  ? DILATION AND EVACUATION N/A 10/11/2017  ? Procedure: suction dilation and evacuation;  Surgeon: Sloan Leiter, MD;  Location: [facility];  Service: Gynecology;  Laterality: N/A;  ? SHOULDER ARTHROSCOPY WITH LABRAL REPAIR Right 03/21/2021  ? Procedure: SHOULDER ARTHROSCOPY WITH LABRAL REPAIR;  Surgeon: Mordecai Rasmussen, MD;  Location: [facility];  Service: Orthopedics;  Laterality: Right;  ? TUBAL LIGATION Bilateral 02/21/2017  ? pt decided to not have this done  ? TUBAL LIGATION Bilateral 01/08/2019  ? Procedure: POST PARTUM TUBAL LIGATION;  Surgeon: Aletha Halim, MD;  Location: [facility];  Service: Gynecology;  Laterality: Bilateral;  ? WISDOM TOOTH EXTRACTION    ? ?Patient  Active Problem List  ? Diagnosis Date Noted  ? Tenderness of female pelvic organs 03/08/2020  ? Vaginal discharge 03/08/2020  ? Vaginal odor 03/08/2020  ? History of IBS 10/26/2019  ? Constipation 10/26/2019  ? LLQ pain 10/26/2019  ? Acute bilateral low back pain without sciatica 10/26/2019  ? History of gestational diabetes 11/02/2018  ? IBS (irritable bowel syndrome) 10/25/2015  ? BV (bacterial vaginosis) 01/18/2014  ? ? ?ONSET DATE: 03/21/21 ? ?REFERRING DIAG: s/p right shoulder arthroscopic anterior stabilization  ? ?THERAPY DIAG:  ?S/P right shoulder arthroscopic anterior stabilization ? ? ?PERTINENT HISTORY: Patient is a 36 y/o female S/P right shoulder arthroscopic anterior stabilization completed 03/21/21 by Dr. Amedeo Kinsman due to frequent shoulder dislocations.  ? ?PRECAUTIONS: Follow Athroscopic Shoulder Stabilization Protocol. Procotol in media session.  ? ?SUBJECTIVE: S: Magda Paganini took measurements last time.  ? ?PAIN:  ?Are you having pain? No ? ? ? ? ?OBJECTIVE:  ? ?TODAY'S TREATMENT:  ?05/13/21 ?-Manual technique-subscapularis and teres minor release completed supine. Latissimus release, foam roller, sidelying. Standing self myofascial release to anterior shoulder and pectoralis using tennis ball.   ?-AA/ROM shoulder protraction, flexion, abduction, er/IR, abduction 12X each standing ?-Shoulder Stretches: er at wall, shoulder adducted, 2x20" ? ? ?05/09/21 ?-Manual technique-subscapularis and teres minor release, muscle energy technique to right external rotators ?-P/ROM shoulder flexion, horizontal ab/adduction, er/IR, abduction, 5X each ?-AA/ROM  shoulder protraction, flexion, horizontal ab/adduction, er/IR, abduction 10X each standing ?-Wall wash, 1'  ?-Pulley's 2' flexion ? ? ?05/06/21 ?-Myofascial release: completed separately from therapeutic exercises. Myofascial release and manual stretching completed to right upper arm, upper trapezius, scapularis region and elbow to decrease fascial restrictions and  increase joint mobility in a pain free zone.  ?-P/ROM shoulder flexion, horizontal ab/adduction, er/IR, abduction, 5X each ?-AA/ROM shoulder protraction, flexion, horizontal ab/adduction, er/IR, abduction 15X each supine; 10X each standing ?-Proximal shoulder strengthening in supine, 10X each no rest breaks ?-Prone extension: 10X ?-Pulleys, 1' flexion 1' abduction ? ?Patient education:  ?Self myofascial release with tennis ball - pectoralis. External rotation stretch at wall.  ?Person: patient ?Methods: explained, demonstrated, VC, handout ?Comprehension: verbalized and demonstrated understanding ? ? ?Home Exercise Program ?Eval: table slides, A/ROM elbow, forearm, wrist, pendulums 2/23: AA/ROM 3/20: er stretch, self myofascial release with tennis ball. ? ? ?A/ROM Right - 05/09/21  ?Right Shoulder ABduction 180 Degrees ?(not previously assessed)  ?Right Shoulder External Rotation 29 Degrees ?(not previously assessed)  ?Right Shoulder Internal Rotation 90 Degrees ?(not previously assessed)  ?Right Shoulder Flexion 145 Degrees ?(not previously assessed)  ?Overall AROM Comments Assessed seated, er/IR adducted  ?   ?PROM  ?Right Shoulder Flexion 145 Degrees ?(125 previous)  ?Right Shoulder ABduction 180 Degrees ?(145 previous)  ?Right Shoulder Internal Rotation 90 Degrees ?(same as previous)  ?Right Shoulder External Rotation 25 Degrees ?(20 previous)  ?Overall PROM Comments Assessed supine. IR/er adducted  ?PROM Assessment Site Shoulder  ?Right/Left Shoulder Right  ?Strength  ?Right/Left Shoulder Right  ?Right Shoulder Flexion 5/5 ?(not previously assessed)  ?Right Shoulder ABduction 4+/5 ?(not previously assessed)  ?Right Shoulder External Rotation 4+/5 ?(not previously assessed)  ?Right Shoulder Internal Rotation 5/5 ?(not previously assessed)  ?Overall Strength Comments Assessed seated, er/IR adducted  ? ? ? OT Short Term Goals  ? ?  ? OT SHORT TERM GOAL #1  ? Title Patient will be educated and independent with HEP  in order to faciliate her progress in therapy and begin using her RUE as her dominant extremity for 50% of daily tasks.   ? Time 6   ? Period Weeks   ? Status On-going   ? Target Date 05/21/21   ?  ? OT SHORT TERM GOAL #2  ? Title Patient will increase her RUE P/ROM to [facility] in order to increase her ability to complete bathing and dressing tasks with less difficulty.   ? Time 6   ? Period Weeks   ? Status On-going   ?  ? OT SHORT TERM GOAL #3  ? Title Patient will reports a decrease in RUE pain of approximately 5/10 or less when using her UE to complete waist level tasks.   ? Time 6   ? Period Weeks   ? Status On-going   ?  ? OT SHORT TERM GOAL #4  ? Title Patient will increase her RUE strength to 3/5 in order to complete tasks at shoulder level.   ? Time 6   ? Period Weeks   ? Status   ?  ? OT SHORT TERM GOAL #5  ? Title Patient will derease her fascial restrictions to moderate amount in order to increase the functional mobility needed to complete reaching tasks to shoulder level.   ? Time 6   ? Period Weeks   ? Status   ? ?  ?  ? ?  ? ? ? OT Long Term Goals  ? ?  ?  OT LONG TERM GOAL #1  ? Title Patient will increase her RUE A/ROM to WNL in order to reach overhead and/or behind her back with less difficulty.   ? Time 12   ? Period Weeks   ? Status On-going   ? Target Date 07/02/21   ?  ? OT LONG TERM GOAL #2  ? Title Patient will increase her RUE strength to 5/5 in order to be able to return to work and begin lifting and moving packages of at least 15-20lbs with less difficulty.   ? Time 12   ? Period Weeks   ? Status Partially Met   ?  ? OT LONG TERM GOAL #3  ? Title Patient will report a pain level of approximately 2/10 in her RUE while completing light housekeeping tasks.   ? Time 12   ? Period Weeks   ? Status On-going   ?  ? OT LONG TERM GOAL #4  ? Title Patient will decrease her RUE fascial restrictions to min amount or less in order to increase the functional mobility needed to complete overhead reaching  tasks.   ? Time 12   ? Period Weeks   ? Status   ? ?  ?  ? ?  ? ? ? Plan   ? ? Clinical Impression Statement A: . Focused on manual techniques while providing education to patient for carry over at home to work on

## 2021-05-16 ENCOUNTER — Encounter (HOSPITAL_COMMUNITY): Payer: Self-pay

## 2021-05-16 ENCOUNTER — Other Ambulatory Visit: Payer: Self-pay

## 2021-05-16 ENCOUNTER — Ambulatory Visit (HOSPITAL_COMMUNITY): Payer: Medicaid Other

## 2021-05-16 DIAGNOSIS — M25611 Stiffness of right shoulder, not elsewhere classified: Secondary | ICD-10-CM | POA: Diagnosis not present

## 2021-05-16 DIAGNOSIS — M25511 Pain in right shoulder: Secondary | ICD-10-CM

## 2021-05-16 DIAGNOSIS — R29898 Other symptoms and signs involving the musculoskeletal system: Secondary | ICD-10-CM

## 2021-05-16 NOTE — Patient Instructions (Signed)
Repeat all exercises 10-15 times, 1-2 times per day.  1) Shoulder Protraction    Begin with elbows by your side, slowly "punch" straight out in front of you.      2) Shoulder Flexion  Standing:         Begin with arms at your side with thumbs pointed up, slowly raise both arms up and forward towards overhead.               3) Horizontal abduction/adduction   Standing:           Begin with arms straight out in front of you, bring out to the side in at "T" shape. Keep arms straight entire time.       4) Internal & External Rotation  Standing:     Stand with elbows at the side and elbows bent 90 degrees. Move your forearms away from your body, then bring back inward toward the body.     5) Shoulder Abduction  Standing:       Begin with your arms next to your side. Slowly move your arms out to the side so that they go overhead, in a jumping jack or snow angel movement.      

## 2021-05-16 NOTE — Therapy (Signed)
? ?  ? ?  ?OUTPATIENT OCCUPATIONAL THERAPY TREATMENT NOTE ? ? ?Patient Name: [name] ?MRN: [id number] ?DOB:07/20/1985, 36 y.o., female ?Today's Date: 05/16/2021 ? ?PCP: [facility], Trenton Gammon, NP ?REFERRING PROVIDER: Dr. Wyvonna Plum ? ? OT End of Session - 05/16/21 1131   ? ? Visit Number 12   ? Number of Visits 24   ? Date for OT Re-Evaluation 07/02/21   ? Authorization Type [employer] [place] [facility] Medicaid   ? Authorization Time Period 27 visits combined OT/PT/SLP Reminder: does not cover ES unattended, ionto, hot/col packs, [place]. [employer] has approved 1 re-eval and 15 treatment visits (04/11/21-05/20/21)   ? Authorization - Visit Number 11   ? Authorization - Number of Visits 15   ? OT Start Time 1120   ? OT Stop Time 1158   ? OT Time Calculation (min) 38 min   ? Activity Tolerance Patient tolerated treatment well   ? Behavior During Therapy [facility] for tasks assessed/performed   ? ?  ?  ? ?  ? ? ? ? ? ?Past Medical History:  ?Diagnosis Date  ? Anemia   ? Blood transfusion without reported diagnosis   ? Dislocated shoulder 03/31/2013  ? recurrant   ? IBS (irritable bowel syndrome)   ? with constipation  ? Shoulder dislocation, recurrent   ? ?Past Surgical History:  ?Procedure Laterality Date  ? DILATION AND CURETTAGE OF UTERUS  10/11/2017  ? incomplete abortion  ? DILATION AND EVACUATION N/A 10/11/2017  ? Procedure: suction dilation and evacuation;  Surgeon: Sloan Leiter, MD;  Location: [place] [facility];  Service: Gynecology;  Laterality: N/A;  ? SHOULDER ARTHROSCOPY WITH LABRAL REPAIR Right 03/21/2021  ? Procedure: SHOULDER ARTHROSCOPY WITH LABRAL REPAIR;  Surgeon: Mordecai Rasmussen, MD;  Location: [facility];  Service: Orthopedics;  Laterality: Right;  ? TUBAL LIGATION Bilateral 02/21/2017  ? pt decided to not have this done  ? TUBAL LIGATION Bilateral 01/08/2019  ? Procedure: POST PARTUM TUBAL LIGATION;  Surgeon: Aletha Halim, MD;  Location: [facility];  Service: Gynecology;  Laterality: Bilateral;  ? WISDOM TOOTH EXTRACTION    ? ?Patient  Active Problem List  ? Diagnosis Date Noted  ? Tenderness of female pelvic organs 03/08/2020  ? Vaginal discharge 03/08/2020  ? Vaginal odor 03/08/2020  ? History of IBS 10/26/2019  ? Constipation 10/26/2019  ? LLQ pain 10/26/2019  ? Acute bilateral low back pain without sciatica 10/26/2019  ? History of gestational diabetes 11/02/2018  ? IBS (irritable bowel syndrome) 10/25/2015  ? BV (bacterial vaginosis) 01/18/2014  ? ? ?ONSET DATE: 03/21/21 ? ?REFERRING DIAG: s/p right shoulder arthroscopic anterior stabilization  ? ?THERAPY DIAG:  ?S/P right shoulder arthroscopic anterior stabilization ? ? ?PERTINENT HISTORY: Patient is a 36 y/o female S/P right shoulder arthroscopic anterior stabilization completed 03/21/21 by Dr. Amedeo Kinsman due to frequent shoulder dislocations.  ? ?PRECAUTIONS: Follow Athroscopic Shoulder Stabilization Protocol. Procotol in media session.  ? ?SUBJECTIVE: S: I can feel it when I'm doing it right. (During scapular strengthening) ? ?PAIN:  ?Are you having pain? No ? ? ? ? ?OBJECTIVE:  ? ?TODAY'S TREATMENT:  ?05/16/21 ?-Manual technique-subscapularis and teres minor release completed supine.  ?-P/ROM shoulder flexion, horizontal ab/adduction, er/IR, abduction, 5X each ?-A/ROM: supine, Flexion, protraction, IR/er, horizontal abduction/adduction, abduction 12X ?-AA/ROM: supine IR/er 10X (prior to completing A/ROM) ?-A/ROM: standing,Flexion, protraction, IR/er: 12X.  Horizontal abduction/adduction, abduction:10X ?- Scapular strengthening: red band, row, extension, 10X ? ? ? ?05/13/21 ?-Manual technique-subscapularis and teres minor release completed supine.  Latissimus release, foam roller, sidelying. Standing self myofascial release to anterior shoulder and pectoralis using tennis ball.   ?-AA/ROM shoulder protraction, flexion, abduction, er/IR, abduction 12X each standing ?-Shoulder Stretches: er at wall, shoulder adducted, 2x20" ? ? ? ? ? ? ? ?Patient education:  ? ?Person:  ?Methods:   ?Comprehension: ? ? ?Home Exercise Program ?Eval: table slides, A/ROM elbow, forearm, wrist, pendulums 2/23: AA/ROM 3/20: er stretch, self myofascial release with tennis ball. ? ? ?A/ROM Right - 05/09/21  ?Right Shoulder ABduction 180 Degrees ?(not previously assessed)  ?Right Shoulder External Rotation 29 Degrees ?(not previously assessed)  ?Right Shoulder Internal Rotation 90 Degrees ?(not previously assessed)  ?Right Shoulder Flexion 145 Degrees ?(not previously assessed)  ?Overall AROM Comments Assessed seated, er/IR adducted  ?   ?PROM  ?Right Shoulder Flexion 145 Degrees ?(125 previous)  ?Right Shoulder ABduction 180 Degrees ?(145 previous)  ?Right Shoulder Internal Rotation 90 Degrees ?(same as previous)  ?Right Shoulder External Rotation 25 Degrees ?(20 previous)  ?Overall PROM Comments Assessed supine. IR/er adducted  ?PROM Assessment Site Shoulder  ?Right/Left Shoulder Right  ?Strength  ?Right/Left Shoulder Right  ?Right Shoulder Flexion 5/5 ?(not previously assessed)  ?Right Shoulder ABduction 4+/5 ?(not previously assessed)  ?Right Shoulder External Rotation 4+/5 ?(not previously assessed)  ?Right Shoulder Internal Rotation 5/5 ?(not previously assessed)  ?Overall Strength Comments Assessed seated, er/IR adducted  ? ? ? OT Short Term Goals  ? ?  ? OT SHORT TERM GOAL #1  ? Title Patient will be educated and independent with HEP in order to faciliate her progress in therapy and begin using her RUE as her dominant extremity for 50% of daily tasks.   ? Time 6   ? Period Weeks   ? Status On-going   ? Target Date 05/21/21   ?  ? OT SHORT TERM GOAL #2  ? Title Patient will increase her RUE P/ROM to [facility] in order to increase her ability to complete bathing and dressing tasks with less difficulty.   ? Time 6   ? Period Weeks   ? Status On-going   ?  ? OT SHORT TERM GOAL #3  ? Title Patient will reports a decrease in RUE pain of approximately 5/10 or less when using her UE to complete waist level tasks.   ? Time 6    ? Period Weeks   ? Status On-going   ?  ? OT SHORT TERM GOAL #4  ? Title Patient will increase her RUE strength to 3/5 in order to complete tasks at shoulder level.   ? Time 6   ? Period Weeks   ? Status   ?  ? OT SHORT TERM GOAL #5  ? Title Patient will derease her fascial restrictions to moderate amount in order to increase the functional mobility needed to complete reaching tasks to shoulder level.   ? Time 6   ? Period Weeks   ? Status   ? ?  ?  ? ?  ? ? ? OT Long Term Goals  ? ?  ? OT LONG TERM GOAL #1  ? Title Patient will increase her RUE A/ROM to WNL in order to reach overhead and/or behind her back with less difficulty.   ? Time 12   ? Period Weeks   ? Status On-going   ? Target Date 07/02/21   ?  ? OT LONG TERM GOAL #2  ? Title Patient will increase her RUE strength to 5/5 in order to be able  to return to work and begin lifting and moving packages of at least 15-20lbs with less difficulty.   ? Time 12   ? Period Weeks   ? Status Partially Met   ?  ? OT LONG TERM GOAL #3  ? Title Patient will report a pain level of approximately 2/10 in her RUE while completing light housekeeping tasks.   ? Time 12   ? Period Weeks   ? Status On-going   ?  ? OT LONG TERM GOAL #4  ? Title Patient will decrease her RUE fascial restrictions to min amount or less in order to increase the functional mobility needed to complete overhead reaching tasks.   ? Time 12   ? Period Weeks   ? Status   ? ?  ?  ? ?  ? ? ? Plan   ? ? Clinical Impression Statement A: . Manual techniques completed to address fascial restrictions in RUE. Progressed to A/ROM supine and standing. HEP updated. Completed scapular strengthening and able to adjust form and technique with verbal and tactile cues provided.   ? Body Structure / Function / Physical Skills ADL;UE functional use;Fascial restriction;Pain;ROM;Strength;Edema;IADL;Decreased knowledge of precautions;Mobility   ? Plan P: Follow up on HEP. Continue with A/ROM. Add retraction with red band.  Functional reaching task.  ? OT Home Exercise Plan eval: table slide, A/ROM elbow, forearm, wrist, pendulum 2/23: AA/ROM 3/20: er stretch  ? Consulted and Agree with Plan of Care Patient   ? ?  ?  ? ?  ? ? ?Aileen Pilot

## 2021-05-20 ENCOUNTER — Encounter (HOSPITAL_COMMUNITY): Payer: Self-pay

## 2021-05-20 ENCOUNTER — Other Ambulatory Visit: Payer: Self-pay

## 2021-05-20 ENCOUNTER — Ambulatory Visit (HOSPITAL_COMMUNITY): Payer: Medicaid Other

## 2021-05-20 DIAGNOSIS — M25611 Stiffness of right shoulder, not elsewhere classified: Secondary | ICD-10-CM | POA: Diagnosis not present

## 2021-05-20 DIAGNOSIS — M25511 Pain in right shoulder: Secondary | ICD-10-CM

## 2021-05-20 DIAGNOSIS — R29898 Other symptoms and signs involving the musculoskeletal system: Secondary | ICD-10-CM

## 2021-05-20 NOTE — Patient Instructions (Signed)
1) (Home) Extension: Isometric / Bilateral Arm Retraction - Sitting ? ? ?Facing anchor, hold hands and elbow at shoulder height, with elbow bent.  Pull arms back to squeeze shoulder blades together. Repeat 12-15 times. 1-3 times/day.  ? ?2) (Clinic) Extension / Flexion (Assist) ? ? ?Face anchor, pull arms back, keeping elbow straight, and squeze shoulder blades together. ?Repeat 12-15 times. 1 times/day.  ? ?Copyright ? V[employer]. All rights reserved.  ? ?3) (Home) Retraction: Row - Bilateral (Anchor) ? ? ?Facing anchor, arms reaching forward, pull hands toward stomach, keeping elbows bent and at your sides and pinching shoulder blades together. ?Repeat 12-15 times. 1  times/day.  ? ?Copyright ? V[employer]. All rights reserved.   ? ?Repeat all exercises 12-15 times, 1 times per day. ? ?1) Shoulder Protraction ? ? ? ?Begin with elbows by your side, slowly "punch" straight out in front of you.   ? ? ? ?2) Shoulder Flexion ? ? Standing:  ?      ? ?Begin with arms at your side with thumbs pointed up, slowly raise both arms up and forward towards overhead.  ? ? ? ? ?3) Horizontal abduction/adduction ? ? ? ?Standing: ? ?       ? ? ?Begin with arms straight out in front of you, bring out to the side in at "T" shape. Keep arms straight entire time.  ? ? ? ? ? ?4) Internal & External Rotation ? ?Standing: ?   ? ?Stand with elbows at the side and elbows bent 90 degrees. Move your forearms away from your body, then bring back inward toward the body.   ? ? ?5) Shoulder Abduction ? ? Standing:  ? ? ?  ? ?begin with your arms next to your side. Slowly move your arms out to the side so that they go overhead, in a jumping jack or snow angel movement.  ? ? ?  ?

## 2021-05-20 NOTE — Therapy (Signed)
? ?  ? ?  ?OUTPATIENT OCCUPATIONAL THERAPY TREATMENT NOTE ? ? ?Patient Name: [name] ?MRN: [id number] ?DOB:[birth year], 36 y.o., female ?Today's Date: 05/20/2021 ? ?PCP: [facility], Trenton Gammon, NP ?REFERRING PROVIDER: Dr. Larena Glassman ? ? OT End of Session - 05/20/21 1237   ? ? Visit Number 13   ? Number of Visits 24   ? Date for OT Re-Evaluation 07/02/21   ? Authorization Type [employer] [facility] Medicaid   ? Authorization Time Period 27 visits combined OT/PT/SLP Reminder: does not cover ES unattended, ionto, hot/col packs, [place]. [employer] has approved 1 re-eval and 15 treatment visits (04/11/21-05/20/21) *On 05/20/21: requesting 6 additional visits.   ? Authorization - Visit Number 12   ? Authorization - Number of Visits 15   ? OT Start Time 1118   ? OT Stop Time 1156   ? OT Time Calculation (min) 38 min   ? Activity Tolerance Patient tolerated treatment well   ? Behavior During Therapy [facility] for tasks assessed/performed   ? ?  ?  ? ?  ? ? ? ? ? ? ?Past Medical History:  ?Diagnosis Date  ? Anemia   ? Blood transfusion without reported diagnosis   ? Dislocated shoulder 03/31/2013  ? recurrant   ? IBS (irritable bowel syndrome)   ? with constipation  ? Shoulder dislocation, recurrent   ? ?Past Surgical History:  ?Procedure Laterality Date  ? DILATION AND CURETTAGE OF UTERUS  10/11/2017  ? incomplete abortion  ? DILATION AND EVACUATION N/A 10/11/2017  ? Procedure: suction dilation and evacuation;  Surgeon: Sloan Leiter, MD;  Location: [place] [facility];  Service: Gynecology;  Laterality: N/A;  ? SHOULDER ARTHROSCOPY WITH LABRAL REPAIR Right 03/21/2021  ? Procedure: SHOULDER ARTHROSCOPY WITH LABRAL REPAIR;  Surgeon: Mordecai Rasmussen, MD;  Location: [facility];  Service: Orthopedics;  Laterality: Right;  ? TUBAL LIGATION Bilateral 02/21/2017  ? pt decided to not have this done  ? TUBAL LIGATION Bilateral 01/08/2019  ? Procedure: POST PARTUM TUBAL LIGATION;  Surgeon: Aletha Halim, MD;  Location: [facility];  Service: Gynecology;  Laterality:  Bilateral;  ? WISDOM TOOTH EXTRACTION    ? ?Patient Active Problem List  ? Diagnosis Date Noted  ? Tenderness of female pelvic organs 03/08/2020  ? Vaginal discharge 03/08/2020  ? Vaginal odor 03/08/2020  ? History of IBS 10/26/2019  ? Constipation 10/26/2019  ? LLQ pain 10/26/2019  ? Acute bilateral low back pain without sciatica 10/26/2019  ? History of gestational diabetes 11/02/2018  ? IBS (irritable bowel syndrome) 10/25/2015  ? BV (bacterial vaginosis) 01/18/2014  ? ? ?ONSET DATE: 03/21/21 ? ?REFERRING DIAG: s/p right shoulder arthroscopic anterior stabilization  ? ?THERAPY DIAG:  ?S/P right shoulder arthroscopic anterior stabilization ? ? ?PERTINENT HISTORY: Patient is a 36 y/o female S/P right shoulder arthroscopic anterior stabilization completed 03/21/21 by Dr. Amedeo Kinsman due to frequent shoulder dislocations.  ? ?PRECAUTIONS: Follow Athroscopic Shoulder Stabilization Protocol. Procotol in media session.  ? ?SUBJECTIVE: S: I can feel it when I'm doing it right. (During scapular strengthening) ? ?PAIN:  ?Are you having pain? No ? ? ? ? ?OBJECTIVE:  ? ?TODAY'S TREATMENT:  ?05/20/21 ?- P/ROM shoulder flexion, horizontal ab/adduction, er/IR, abduction, 5X each ?- A/ROM: standing,Flexion, protraction, IR/er: 12X.  Horizontal abduction/adduction, abduction:12X ?- Proximal shoulder strengthening: standing, 10X each no rest breaks ?- Scapular strengthening: red band, row, retraction, extension, 12X ? ? ?05/16/21 ?-Manual technique-subscapularis and teres minor release completed supine.  ?-P/ROM shoulder flexion, horizontal ab/adduction, er/IR,  abduction, 5X each ?-A/ROM: supine, Flexion, protraction, IR/er, horizontal abduction/adduction, abduction 12X ?-AA/ROM: supine IR/er 10X (prior to completing A/ROM) ?-A/ROM: standing,Flexion, protraction, IR/er: 12X.  Horizontal abduction/adduction, abduction:10X ?- Scapular strengthening: red band, row, extension, 10X ?- UBE bike: level 1, 2' forward 2' reverse, pace:  5.0-5.5 ? ? ? ? ? ?Patient education:  ?Scapular strengthening with red band, A/ROM shoulder  ?Person: patient ?Methods: verbal, demonstration, handout ?Comprehension:demonstrated understanding ? ? ?Home Exercise Program ?Eval: table slides, A/ROM elbow, forearm, wrist, pendulums 2/23: AA/ROM 3/20: er stretch, self myofascial release with tennis ball. 3/27: Scapular strengthening - red, A/ROM shoulder ? ? ? ? ? OT Short Term Goals  ? ?  ? OT SHORT TERM GOAL #1  ? Title Patient will be educated and independent with HEP in order to faciliate her progress in therapy and begin using her RUE as her dominant extremity for 50% of daily tasks.   ? Time 6   ? Period Weeks   ? Status On-going   ? Target Date 05/21/21   ?  ? OT SHORT TERM GOAL #2  ? Title Patient will increase her RUE P/ROM to [facility] in order to increase her ability to complete bathing and dressing tasks with less difficulty.   ? Time 6   ? Period Weeks   ? Status On-going   ?  ? OT SHORT TERM GOAL #3  ? Title Patient will reports a decrease in RUE pain of approximately 5/10 or less when using her UE to complete waist level tasks.   ? Time 6   ? Period Weeks   ? Status On-going   ?  ? OT SHORT TERM GOAL #4  ? Title Patient will increase her RUE strength to 3/5 in order to complete tasks at shoulder level.   ? Time 6   ? Period Weeks   ? Status   ?  ? OT SHORT TERM GOAL #5  ? Title Patient will derease her fascial restrictions to moderate amount in order to increase the functional mobility needed to complete reaching tasks to shoulder level.   ? Time 6   ? Period Weeks   ? Status   ? ?  ?  ? ?  ? ? ? OT Long Term Goals  ? ?  ? OT LONG TERM GOAL #1  ? Title Patient will increase her RUE A/ROM to WNL in order to reach overhead and/or behind her back with less difficulty.   ? Time 12   ? Period Weeks   ? Status On-going   ? Target Date 07/02/21   ?  ? OT LONG TERM GOAL #2  ? Title Patient will increase her RUE strength to 5/5 in order to be able to return to work and  begin lifting and moving packages of at least 15-20lbs with less difficulty.   ? Time 12   ? Period Weeks   ? Status Partially Met   ?  ? OT LONG TERM GOAL #3  ? Title Patient will report a pain level of approximately 2/10 in her RUE while completing light housekeeping tasks.   ? Time 12   ? Period Weeks   ? Status On-going   ?  ? OT LONG TERM GOAL #4  ? Title Patient will decrease her RUE fascial restrictions to min amount or less in order to increase the functional mobility needed to complete overhead reaching tasks.   ? Time 12   ? Period Weeks   ? Status   ? ?  ?  ? ?  ? ? ?  Plan   ? ? Clinical Impression Statement A: . Completed A/ROM while tolerating increased repetitions. Added UBE bike and retraction with red band. VC were provided for form and technique. Moderate muscle fatigue noted while taking rest breaks as needed. Updated HEP.  ? Body Structure / Function / Physical Skills ADL;UE functional use;Fascial restriction;Pain;ROM;Strength;Edema;IADL;Decreased knowledge of precautions;Mobility   ? Plan P: Follow up on HEP. Continue with A/ROM. Completed functional reaching task.  ? OT Home Exercise Plan eval: table slide, A/ROM elbow, forearm, wrist, pendulum 2/23: AA/ROM 3/20: er stretch  ? Consulted and Agree with Plan of Care Patient   ? ?  ?  ? ?  ? ? ?Ailene Ravel, OTR/L,CBIS  ?[phone] ? ?05/20/2021, 12:43 PM ? ?  ? ? ? ?

## 2021-05-22 ENCOUNTER — Other Ambulatory Visit: Payer: Self-pay

## 2021-05-22 ENCOUNTER — Ambulatory Visit (HOSPITAL_COMMUNITY): Payer: Medicaid Other | Admitting: Occupational Therapy

## 2021-05-22 ENCOUNTER — Encounter (HOSPITAL_COMMUNITY): Payer: Self-pay | Admitting: Occupational Therapy

## 2021-05-22 DIAGNOSIS — M25611 Stiffness of right shoulder, not elsewhere classified: Secondary | ICD-10-CM | POA: Diagnosis not present

## 2021-05-22 DIAGNOSIS — M25511 Pain in right shoulder: Secondary | ICD-10-CM

## 2021-05-22 DIAGNOSIS — R29898 Other symptoms and signs involving the musculoskeletal system: Secondary | ICD-10-CM

## 2021-05-22 NOTE — Therapy (Signed)
? ?  ? ?  ?OUTPATIENT OCCUPATIONAL THERAPY TREATMENT NOTE ? ? ?Patient Name: [name] ?MRN: [id number] ?DOB:[birth year], 36 y.o., female ?Today's Date: 05/22/2021 ? ?PCP: [facility], Trenton Gammon, NP ?REFERRING PROVIDER: Dr. Larena Glassman ? ? OT End of Session - 05/22/21 1157   ? ? Visit Number 14   ? Number of Visits 24   ? Date for OT Re-Evaluation 07/02/21   ? Authorization Type [facility] Medicaid   ? Authorization Time Period 27 visits combined OT/PT/SLP Reminder: does not cover ES unattended, ionto, hot/col packs, [place]. [employer] has approved 1 re-eval and 15 treatment visits (04/11/21-05/20/21) *On 05/20/21: requesting 6 additional visits.   ? Authorization - Visit Number 0   ? Authorization - Number of Visits 6   ? OT Start Time [phone]   ? OT Stop Time 3559   ? OT Time Calculation (min) 42 min   ? Activity Tolerance Patient tolerated treatment well   ? Behavior During Therapy [facility] for tasks assessed/performed   ? ?  ?  ? ?  ? ? ? ? ? ? ? ?Past Medical History:  ?Diagnosis Date  ? Anemia   ? Blood transfusion without reported diagnosis   ? Dislocated shoulder 03/31/2013  ? recurrant   ? IBS (irritable bowel syndrome)   ? with constipation  ? Shoulder dislocation, recurrent   ? ?Past Surgical History:  ?Procedure Laterality Date  ? DILATION AND CURETTAGE OF UTERUS  10/11/2017  ? incomplete abortion  ? DILATION AND EVACUATION N/A 10/11/2017  ? Procedure: suction dilation and evacuation;  Surgeon: Sloan Leiter, MD;  Location: [place] [facility];  Service: Gynecology;  Laterality: N/A;  ? SHOULDER ARTHROSCOPY WITH LABRAL REPAIR Right 03/21/2021  ? Procedure: SHOULDER ARTHROSCOPY WITH LABRAL REPAIR;  Surgeon: Mordecai Rasmussen, MD;  Location: [facility];  Service: Orthopedics;  Laterality: Right;  ? TUBAL LIGATION Bilateral 02/21/2017  ? pt decided to not have this done  ? TUBAL LIGATION Bilateral 01/08/2019  ? Procedure: POST PARTUM TUBAL LIGATION;  Surgeon: Aletha Halim, MD;  Location: [facility];  Service: Gynecology;  Laterality:  Bilateral;  ? WISDOM TOOTH EXTRACTION    ? ?Patient Active Problem List  ? Diagnosis Date Noted  ? Tenderness of female pelvic organs 03/08/2020  ? Vaginal discharge 03/08/2020  ? Vaginal odor 03/08/2020  ? History of IBS 10/26/2019  ? Constipation 10/26/2019  ? LLQ pain 10/26/2019  ? Acute bilateral low back pain without sciatica 10/26/2019  ? History of gestational diabetes 11/02/2018  ? IBS (irritable bowel syndrome) 10/25/2015  ? BV (bacterial vaginosis) 01/18/2014  ? ? ?ONSET DATE: 03/21/21 ? ?REFERRING DIAG: s/p right shoulder arthroscopic anterior stabilization  ? ?THERAPY DIAG:  ?S/P right shoulder arthroscopic anterior stabilization ? ? ?PERTINENT HISTORY: Patient is a 36 y/o female S/P right shoulder arthroscopic anterior stabilization completed 03/21/21 by Dr. Amedeo Kinsman due to frequent shoulder dislocations.  ? ?PRECAUTIONS: Follow Athroscopic Shoulder Stabilization Protocol. Procotol in media session.  ? ?SUBJECTIVE: S: It's doing really good.  ? ?PAIN:  ?Are you having pain? No ? ? ? ? ?OBJECTIVE:  ? ?TODAY'S TREATMENT:  ?05/22/21 ? - P/ROM shoulder flexion, horizontal ab/adduction, er/IR, abduction, 5X each ?- A/ROM: standing,Flexion, protraction, IR/er, Horizontal abduction/adduction, abduction 12X ?-Shoulder stretches: flexion, er, doorway stretch, 3x20" holds ?-Therapy ball strengthening: green ball, flexion, chest press, overhead press, circles each direction, 10X each ?-X to V arms, 10X each ?-Overhead lacing: lacing from the top down then removing ?-Scapular theraband: red band, row,  extension, retraction, 12X each ?-Functional reaching: removing items from top shelf of kitchen cabinet, placing items from middle shelf onto top shelf, then replacing items onto middle shelf.  ?-UBE: level 1 2' forward 2' reverse, pace: 5.0 ? ? ?05/20/21 ?- P/ROM shoulder flexion, horizontal ab/adduction, er/IR, abduction, 5X each ?- A/ROM: standing,Flexion, protraction, IR/er: 12X.  Horizontal abduction/adduction,  abduction:12X ?- Proximal shoulder strengthening: standing, 10X each no rest breaks ?- Scapular strengthening: red band, row, retraction, extension, 12X ? ? ?05/16/21 ?-Manual technique-subscapularis and teres minor release completed supine.  ?-P/ROM shoulder flexion, horizontal ab/adduction, er/IR, abduction, 5X each ?-A/ROM: supine, Flexion, protraction, IR/er, horizontal abduction/adduction, abduction 12X ?-AA/ROM: supine IR/er 10X (prior to completing A/ROM) ?-A/ROM: standing,Flexion, protraction, IR/er: 12X.  Horizontal abduction/adduction, abduction:10X ?- Scapular strengthening: red band, row, extension, 10X ?- UBE bike: level 1, 2' forward 2' reverse, pace: 5.0-5.5 ? ? ? ? ? ?Patient education:  ?Scapular strengthening with red band, A/ROM shoulder  ?Person: patient ?Methods: verbal, demonstration, handout ?Comprehension:demonstrated understanding ? ? ?Home Exercise Program ?Eval: table slides, A/ROM elbow, forearm, wrist, pendulums 2/23: AA/ROM 3/20: er stretch, self myofascial release with tennis ball. 3/27: Scapular strengthening - red, A/ROM shoulder ? ? ? ? ? OT Short Term Goals  ? ?  ? OT SHORT TERM GOAL #1  ? Title Patient will be educated and independent with HEP in order to faciliate her progress in therapy and begin using her RUE as her dominant extremity for 50% of daily tasks.   ? Time 6   ? Period Weeks   ? Status On-going   ? Target Date 05/21/21   ?  ? OT SHORT TERM GOAL #2  ? Title Patient will increase her RUE P/ROM to [facility] in order to increase her ability to complete bathing and dressing tasks with less difficulty.   ? Time 6   ? Period Weeks   ? Status On-going   ?  ? OT SHORT TERM GOAL #3  ? Title Patient will reports a decrease in RUE pain of approximately 5/10 or less when using her UE to complete waist level tasks.   ? Time 6   ? Period Weeks   ? Status On-going   ?  ? OT SHORT TERM GOAL #4  ? Title Patient will increase her RUE strength to 3/5 in order to complete tasks at shoulder  level.   ? Time 6   ? Period Weeks   ? Status   ?  ? OT SHORT TERM GOAL #5  ? Title Patient will derease her fascial restrictions to moderate amount in order to increase the functional mobility needed to complete reaching tasks to shoulder level.   ? Time 6   ? Period Weeks   ? Status   ? ?  ?  ? ?  ? ? ? OT Long Term Goals  ? ?  ? OT LONG TERM GOAL #1  ? Title Patient will increase her RUE A/ROM to WNL in order to reach overhead and/or behind her back with less difficulty.   ? Time 12   ? Period Weeks   ? Status On-going   ? Target Date 07/02/21   ?  ? OT LONG TERM GOAL #2  ? Title Patient will increase her RUE strength to 5/5 in order to be able to return to work and begin lifting and moving packages of at least 15-20lbs with less difficulty.   ? Time 12   ? Period Weeks   ? Status Partially  Met   ?  ? OT LONG TERM GOAL #3  ? Title Patient will report a pain level of approximately 2/10 in her RUE while completing light housekeeping tasks.   ? Time 12   ? Period Weeks   ? Status On-going   ?  ? OT LONG TERM GOAL #4  ? Title Patient will decrease her RUE fascial restrictions to min amount or less in order to increase the functional mobility needed to complete overhead reaching tasks.   ? Time 12   ? Period Weeks   ? Status   ? ?  ?  ? ?  ? ? ? Plan   ? ? Clinical Impression Statement A: Pt reports HEP is going well. Completed A/ROM in standing, added shoulder stretches, overhead lacing and x to v arms today. Pt completing functional reaching task, began strengthening using therapy ball. Min/mod fatigue at end of session. Verbal cuing for form and technique.   ? Body Structure / Function / Physical Skills ADL;UE functional use;Fascial restriction;Pain;ROM;Strength;Edema;IADL;Decreased knowledge of precautions;Mobility   ? Plan P: Continue progressing to strengthening, trial low weight in standing, add ball on wall  ? OT Home Exercise Plan eval: table slide, A/ROM elbow, forearm, wrist, pendulum 2/23: AA/ROM 3/20: er  stretch  ? Consulted and Agree with Plan of Care Patient   ? ?  ?  ? ?  ? ? ?Guadelupe Sabin, OTR/L  ?[phone] ?05/22/2021, 9:37 AM ? ?  ? ? ? ?

## 2021-05-23 ENCOUNTER — Encounter (HOSPITAL_COMMUNITY): Payer: Medicaid Other | Admitting: Occupational Therapy

## 2021-05-29 ENCOUNTER — Encounter (HOSPITAL_COMMUNITY): Payer: Medicaid Other | Admitting: Occupational Therapy

## 2021-06-04 ENCOUNTER — Ambulatory Visit: Payer: Medicaid Other | Admitting: Nurse Practitioner

## 2021-06-06 ENCOUNTER — Ambulatory Visit (HOSPITAL_COMMUNITY): Payer: Medicaid Other

## 2021-06-06 ENCOUNTER — Telehealth (HOSPITAL_COMMUNITY): Payer: Self-pay

## 2021-06-11 ENCOUNTER — Ambulatory Visit: Payer: Medicaid Other | Admitting: Orthopedic Surgery

## 2021-06-13 ENCOUNTER — Ambulatory Visit (HOSPITAL_COMMUNITY): Payer: Medicaid Other | Attending: Orthopedic Surgery | Admitting: Occupational Therapy

## 2021-06-13 ENCOUNTER — Encounter (HOSPITAL_COMMUNITY): Payer: Self-pay | Admitting: Occupational Therapy

## 2021-06-13 DIAGNOSIS — M25611 Stiffness of right shoulder, not elsewhere classified: Secondary | ICD-10-CM | POA: Insufficient documentation

## 2021-06-13 DIAGNOSIS — R29898 Other symptoms and signs involving the musculoskeletal system: Secondary | ICD-10-CM | POA: Insufficient documentation

## 2021-06-13 DIAGNOSIS — M25511 Pain in right shoulder: Secondary | ICD-10-CM | POA: Insufficient documentation

## 2021-06-13 NOTE — Therapy (Signed)
? ?  ? ?  ?OUTPATIENT OCCUPATIONAL THERAPY REASSESSMENT & TREATMENT NOTE ?DISCHARGE SUMMARY ? ? ?Patient Name: [name] ?MRN: [id number] ?DOB:19-Nov-1985, 36 y.o., female ?Today's Date: 06/13/2021 ? ?PCP: [facility], Alvino Chapel, NP ?REFERRING PROVIDER: Dr. Thane Edu ? ? OT End of Session - 06/13/21 1552   ? ? Visit Number 15   ? Number of Visits 24   ? Date for OT Re-Evaluation 07/02/21   ? Authorization Type [facility] Medicaid   ? Authorization Time Period 27 visits combined OT/PT/SLP Reminder: does not cover ES unattended, ionto, hot/col packs, [place]. [employer] has approved 6 visits 3/29-4/18   ? Authorization - Visit Number 0   ? Authorization - Number of Visits 6   ? OT Start Time 1519   ? OT Stop Time 1548   ? OT Time Calculation (min) 29 min   ? Activity Tolerance Patient tolerated treatment well   ? Behavior During Therapy [facility] for tasks assessed/performed   ? ?  ?  ? ?  ? ? ? ? ? ? ? ? ?Past Medical History:  ?Diagnosis Date  ? Anemia   ? Blood transfusion without reported diagnosis   ? Dislocated shoulder 03/31/2013  ? recurrant   ? IBS (irritable bowel syndrome)   ? with constipation  ? Shoulder dislocation, recurrent   ? ?Past Surgical History:  ?Procedure Laterality Date  ? DILATION AND CURETTAGE OF UTERUS  10/11/2017  ? incomplete abortion  ? DILATION AND EVACUATION N/A 10/11/2017  ? Procedure: suction dilation and evacuation;  Surgeon: Conan Bowens, MD;  Location: [facility];  Service: Gynecology;  Laterality: N/A;  ? SHOULDER ARTHROSCOPY WITH LABRAL REPAIR Right 03/21/2021  ? Procedure: SHOULDER ARTHROSCOPY WITH LABRAL REPAIR;  Surgeon: Oliver Barre, MD;  Location: [facility];  Service: Orthopedics;  Laterality: Right;  ? TUBAL LIGATION Bilateral 02/21/2017  ? pt decided to not have this done  ? TUBAL LIGATION Bilateral 01/08/2019  ? Procedure: POST PARTUM TUBAL LIGATION;  Surgeon: [place] Bing, MD;  Location: [facility];  Service: Gynecology;  Laterality: Bilateral;  ? WISDOM TOOTH EXTRACTION     ? ?Patient Active Problem List  ? Diagnosis Date Noted  ? Tenderness of female pelvic organs 03/08/2020  ? Vaginal discharge 03/08/2020  ? Vaginal odor 03/08/2020  ? History of IBS 10/26/2019  ? Constipation 10/26/2019  ? LLQ pain 10/26/2019  ? Acute bilateral low back pain without sciatica 10/26/2019  ? History of gestational diabetes 11/02/2018  ? IBS (irritable bowel syndrome) 10/25/2015  ? BV (bacterial vaginosis) 01/18/2014  ? ? ?ONSET DATE: 03/21/21 ? ?REFERRING DIAG: s/p right shoulder arthroscopic anterior stabilization  ? ?THERAPY DIAG:  ?S/P right shoulder arthroscopic anterior stabilization ? ? ?PERTINENT HISTORY: Patient is a 36 y/o female S/P right shoulder arthroscopic anterior stabilization completed 03/21/21 by Dr. Dallas Schimke due to frequent shoulder dislocations.  ? ?PRECAUTIONS: Follow Athroscopic Shoulder Stabilization Protocol. Procotol in media session.  ? ?SUBJECTIVE: S: It's been doing well, I haven't had much pain at all.  ? ?PAIN:  ?Are you having pain? No ? ? ? ? ?OBJECTIVE:  ? ? ? ? 06/13/21 1522  ?Assessment  ?Medical Diagnosis s/p right shoulder anterior stabilization  ?Precautions  ?Precautions Shoulder  ?Type of Shoulder Precautions Follow Athroscopic Shoulder Stabilization Protocol. Procotol in media session.  ?AROM  ?Overall AROM Comments Assessed seated, er/IR adducted  ?AROM Assessment Site Shoulder  ?Right/Left Shoulder Right  ?Right Shoulder Flexion 160 Degrees ?(145 previous)  ?Right Shoulder ABduction  180 Degrees ?(same as previous)  ?Right Shoulder Internal Rotation 90 Degrees ?(same as previous)  ?Right Shoulder External Rotation 43 Degrees ?(29 previous)  ?PROM  ?Right Shoulder Flexion 160 Degrees ?(145 previous)  ?Right Shoulder ABduction 180 Degrees ?(same as previous)  ?Right Shoulder Internal Rotation 90 Degrees ?(same as previous)  ?Right Shoulder External Rotation 40 Degrees ?(25 previous)  ?Overall PROM Comments Assessed supine. IR/er adducted  ?PROM Assessment Site  Shoulder  ?Right/Left Shoulder Right  ?Strength  ?Right Shoulder Flexion 5/5 ?(same as previous)  ?Right Shoulder ABduction 5/5 ?(4+/5 previous)  ?Right Shoulder Internal Rotation 5/5 ?(same as previous)  ?Right Shoulder External Rotation 5/5 ?(4+/5 previous)  ?Overall Strength Comments Assessed seated, er/IR adducted  ?Strength Assessment Site Shoulder  ?Right/Left Shoulder Right  ? ? ? ? ?TODAY'S TREATMENT:  ?06/13/21: ?-P/ROM-shoulder flexion, er/IR, horizontal ab/adduction, abduction, 5X each ?-Red theraband strengthening: protraction, shoulder flexion, er/IR, horizontal ab/adduction, abduction 10X each ? ? ? ?05/22/21 ? - P/ROM shoulder flexion, horizontal ab/adduction, er/IR, abduction, 5X each ?- A/ROM: standing,Flexion, protraction, IR/er, Horizontal abduction/adduction, abduction 12X ?-Shoulder stretches: flexion, er, doorway stretch, 3x20" holds ?-Therapy ball strengthening: green ball, flexion, chest press, overhead press, circles each direction, 10X each ?-X to V arms, 10X each ?-Overhead lacing: lacing from the top down then removing ?-Scapular theraband: red band, row, extension, retraction, 12X each ?-Functional reaching: removing items from top shelf of kitchen cabinet, placing items from middle shelf onto top shelf, then replacing items onto middle shelf.  ?-UBE: level 1 2' forward 2' reverse, pace: 5.0 ? ? ?05/20/21 ?- P/ROM shoulder flexion, horizontal ab/adduction, er/IR, abduction, 5X each ?- A/ROM: standing,Flexion, protraction, IR/er: 12X.  Horizontal abduction/adduction, abduction:12X ?- Proximal shoulder strengthening: standing, 10X each no rest breaks ?- Scapular strengthening: red band, row, retraction, extension, 12X ? ? ? ? ? ? ?Patient education:  ?Scapular strengthening with red band, A/ROM shoulder  ?Person: patient ?Methods: verbal, demonstration, handout ?Comprehension:demonstrated understanding ? ? ?Home Exercise Program ?Eval: table slides, A/ROM elbow, forearm, wrist, pendulums  2/23: AA/ROM 3/20: er stretch, self myofascial release with tennis ball. 3/27: Scapular strengthening - red, A/ROM shoulder ? ? ? ? ? OT Short Term Goals  ? ?  ? OT SHORT TERM GOAL #1  ? Title Patient will be educated and independent with HEP in order to faciliate her progress in therapy and begin using her RUE as her dominant extremity for 50% of daily tasks.   ? Time 6   ? Period Weeks   ? Status Achieved   ? Target Date 05/21/21   ?  ? OT SHORT TERM GOAL #2  ? Title Patient will [facility] to increase her ability to complete bathing and dressing tasks with less difficulty.   ? Time 6   ? Period Weeks   ? Status Achieved  ?  ? OT SHORT TERM GOAL #3  ? Title Patient will reports a decrease in RUE pain of approximately 5/10 or less when using her UE to complete waist level tasks.   ? Time 6   ? Period Weeks   ? Status Achieved  ?  ? OT SHORT TERM GOAL #4  ? Title Patient will increase her RUE strength to 3/5 in order to complete tasks at shoulder level.   ? Time 6   ? Period Weeks   ? Status   ?  ? OT SHORT TERM GOAL #5  ? Title Patient will derease her fascial restrictions to moderate  amount in order to increase the functional mobility needed to complete reaching tasks to shoulder level.   ? Time 6   ? Period Weeks   ? Status   ? ?  ?  ? ?  ? ? ? OT Long Term Goals  ? ?  ? OT LONG TERM GOAL #1  ? Title Patient will increase her RUE A/ROM to WNL in order to reach overhead and/or behind her back with less difficulty.   ? Time 12   ? Period Weeks   ? Status Achieved   ? Target Date 07/02/21   ?  ? OT LONG TERM GOAL #2  ? Title Patient will increase her RUE strength to 5/5 in order to be able to return to work and begin lifting and moving packages of at least 15-20lbs with less difficulty.   ? Time 12   ? Period Weeks   ? Status Achieved  ?  ? OT LONG TERM GOAL #3  ? Title Patient will report a pain level of approximately 2/10 in her RUE while completing light housekeeping tasks.   ? Time 12    ? Period Weeks   ? Status Achieved  ?  ? OT LONG TERM GOAL #4  ? Title Patient will decrease her RUE fascial restrictions to min amount or less in order to increase the functional mobility needed to complete overh

## 2021-06-13 NOTE — Patient Instructions (Signed)

## 2021-06-14 ENCOUNTER — Ambulatory Visit: Payer: Medicaid Other | Admitting: Orthopedic Surgery

## 2021-06-14 DIAGNOSIS — M25311 Other instability, right shoulder: Secondary | ICD-10-CM

## 2021-06-15 ENCOUNTER — Encounter: Payer: Self-pay | Admitting: Orthopedic Surgery

## 2021-06-15 NOTE — Progress Notes (Signed)
Orthopaedic Postop Note ? ?Assessment: ?[name] is a 36 y.o. female s/p right shoulder anterior stabilization for chronic instability ? ?DOS: 03/21/2021 ? ?Plan: ?[name] has done well.  She is not having any pain in her right shoulder.  Her range of motion has almost returned to full.  She does have some residual weakness in her right shoulder, but she continues to work on her physical therapy exercises.  She should still remain with positioning of her arm, but should be able to gradually return to most of her activities. ? ?Follow-up: ?Return in about 3 months (around 09/13/2021). ?XR at next visit: None ? ?Subjective: ? ?Chief Complaint  ?Patient presents with  ? Routine Post Op  ?  Rt shoulder DOS 03/21/21  ? ? ?History of Present Illness: ?[name] is a 35 y.o. female who presents following the above stated procedure.  Surgery was approximately 3 months ago.  She has some sensitivity over the anterior surgical incisions.  No redness.  No fevers or chills.  She is worked well with physical therapy.  She has no pain in her right shoulder.  Occasionally, she will have her shoulder in a position which does cause a little bit of discomfort, but no sensations concerning for dislocation. ? ? ? ?Review of Systems: ?No fevers or chills ?No numbness or tingling ?No Chest Pain ?No shortness of breath ? ? ?Objective: ?There were no vitals taken for this visit. ? ?Physical Exam: ? ?Alert and oriented.  No acute distress. ? ?Surgical incisions are healing well.  Some tenderness over the anterior incisions.  Active forward elevation to 160 degrees.  Active abduction to 100 degrees.  Internal rotation to the lumbar spine.  Passive external rotation to 40 degrees.  Passively, she has about 60 degrees of external rotation in the 90 degree abducted position.  No feelings of instability. ? ?IMAGING: ?I personally ordered and reviewed the following images: ? ?No new imaging obtained today. ? ? ?Oliver Barre,  MD ?06/15/2021 ?8:25 AM ? ? ?

## 2021-06-18 ENCOUNTER — Encounter: Payer: Self-pay | Admitting: Nurse Practitioner

## 2021-06-18 ENCOUNTER — Ambulatory Visit: Payer: Medicaid Other | Admitting: Nurse Practitioner

## 2021-06-18 ENCOUNTER — Ambulatory Visit: Payer: Medicaid Other | Admitting: Orthopedic Surgery

## 2021-06-18 VITALS — BP 130/64 | HR 84 | Temp 98.2°F | Ht 64.0 in | Wt 161.0 lb

## 2021-06-18 DIAGNOSIS — F5089 Other specified eating disorder: Secondary | ICD-10-CM | POA: Diagnosis not present

## 2021-06-18 DIAGNOSIS — Z7689 Persons encountering health services in other specified circumstances: Secondary | ICD-10-CM | POA: Diagnosis not present

## 2021-06-18 DIAGNOSIS — F32A Depression, unspecified: Secondary | ICD-10-CM | POA: Insufficient documentation

## 2021-06-18 DIAGNOSIS — R079 Chest pain, unspecified: Secondary | ICD-10-CM | POA: Diagnosis not present

## 2021-06-18 DIAGNOSIS — K581 Irritable bowel syndrome with constipation: Secondary | ICD-10-CM | POA: Diagnosis not present

## 2021-06-18 DIAGNOSIS — R011 Cardiac murmur, unspecified: Secondary | ICD-10-CM | POA: Insufficient documentation

## 2021-06-18 DIAGNOSIS — N760 Acute vaginitis: Secondary | ICD-10-CM | POA: Insufficient documentation

## 2021-06-18 DIAGNOSIS — D649 Anemia, unspecified: Secondary | ICD-10-CM | POA: Diagnosis not present

## 2021-06-18 DIAGNOSIS — Z1159 Encounter for screening for other viral diseases: Secondary | ICD-10-CM

## 2021-06-18 DIAGNOSIS — Z8632 Personal history of gestational diabetes: Secondary | ICD-10-CM | POA: Diagnosis not present

## 2021-06-18 DIAGNOSIS — Z13220 Encounter for screening for lipoid disorders: Secondary | ICD-10-CM | POA: Diagnosis not present

## 2021-06-18 DIAGNOSIS — N393 Stress incontinence (female) (male): Secondary | ICD-10-CM

## 2021-06-18 DIAGNOSIS — N870 Mild cervical dysplasia: Secondary | ICD-10-CM | POA: Insufficient documentation

## 2021-06-18 MED ORDER — LINACLOTIDE 72 MCG PO CAPS: 72.0000 ug | ORAL_CAPSULE | Freq: Every day | ORAL | 1 refills | Status: DC

## 2021-06-18 NOTE — Progress Notes (Signed)
?Subjective:  ? ? Patient ID: [name], female    DOB: [birth year], 36 y.o.   MRN: [id number] ? ?HPI ? ?Encounter to establish care ?Patient here to establish care. ? ?Anemia, unspecified type/Pica in adults ?Patient states that she has a history of anemia.  Patient also admits to having history of eating raw flour since she was in middle school.  Patient states that she will eat raw flour by the school school and prefers to be called.  Patient states that she has been diagnosed with pica the past. ? ?Patient states that she has noticed some chest pain that may come and go. ? ?Patient denies any shortness of breath, difficulty breathing, lightheadedness, cold intolerance. ? ?Irritable bowel syndrome with constipation ?Patient states he has history of irritable bowel syndrome with constipation.  Patient was trialed on Linzess which she states helped her tremendously.  Patient would like to restart on Linzess today. ? ?History of gestational diabetes ?Patient states that she has had history of gestational diabetes. ? ?Stress incontinence ?Patient admits to having stress incontinence that she notices when she coughs, laughs, sneezes. ? ? ? ?Review of Systems  ?All other systems reviewed and are negative. ? ?   ?Objective:  ? Physical Exam ?Vitals reviewed.  ?Constitutional:   ?   General: She is not in acute distress. ?   Appearance: Normal appearance. She is normal weight. She is not ill-appearing or toxic-appearing.  ?Cardiovascular:  ?   Rate and Rhythm: Normal rate and regular rhythm.  ?   Pulses: Normal pulses.  ?   Heart sounds: Normal heart sounds.  ?Pulmonary:  ?   Effort: Pulmonary effort is normal.  ?   Breath sounds: Normal breath sounds.  ?Abdominal:  ?   General: Abdomen is flat.  ?   Palpations: Abdomen is soft.  ?Musculoskeletal:  ?   Cervical back: Normal range of motion and neck supple.  ?   Comments: Grossly intact  ?Skin: ?   General: Skin is warm.  ?   Capillary Refill: Capillary refill takes  less than 2 seconds.  ?Neurological:  ?   Mental Status: She is alert.  ?   Comments: Grossly intact  ?Psychiatric:     ?   Mood and Affect: Mood normal.     ?   Behavior: Behavior normal.  ? ? ? ? ? ?   ?Assessment & Plan:  ? ?1. Encounter to establish care ?-Return to clinic in 3 months for follow-up ? ?2. Anemia, unspecified type/Pica in adults ?-Patient has history of Pica which I suspect is due to longstanding anemia. ?-We will evaluate patient today for presence of anemias. ?- B12 and Folate Panel ?- CBC with Differential ?- Fe+TIBC+Fer ?- B12 and Folate Panel ?-Depending on results of anemia evaluation may consider sending patient to heme-onc oncology for further evaluation. ?-I suspect that once her anemia is resolved her Pica may be less pronounced. ?-If Pica continues may consider psychiatric referral/therapy referral ?-Return to clinic in 3 months ? ?3. Chest pain, unspecified type ?-EKG normal sinus rhythm today ?- EKG 12-Lead ?-If chest pain worsens or persists go to the emergency room for evaluation ? ?4. Irritable bowel syndrome with constipation ?- linaclotide (LINZESS) 72 MCG capsule; Take 1 capsule (72 mcg total) by mouth daily before breakfast.  Dispense: 90 capsule; Refill: 1 ? ?5. History of gestational diabetes ?- HgB A1c ?- CMP14+EGFR ? ?6. Stress incontinence ?-We will seek out pelvic floor rehab for patient ?-Patient  encouraged to do Kegels ?- Ambulatory referral to Physical Therapy ? ?7. Lipid screening ?- Lipid Profile ? ?8. Encounter for hepatitis C screening test for low risk patient ?- Hepatitis C Antibody ? ?  ?Note:  This document was prepared using Dragon voice recognition software and may include unintentional dictation errors. ? ? ?

## 2021-06-19 ENCOUNTER — Other Ambulatory Visit: Payer: Self-pay | Admitting: Nurse Practitioner

## 2021-06-19 ENCOUNTER — Other Ambulatory Visit: Payer: Self-pay

## 2021-06-19 DIAGNOSIS — D649 Anemia, unspecified: Secondary | ICD-10-CM | POA: Insufficient documentation

## 2021-06-19 DIAGNOSIS — D508 Other iron deficiency anemias: Secondary | ICD-10-CM

## 2021-06-19 LAB — LIPID PANEL
Chol/HDL Ratio: 3.8 ratio (ref 0.0–4.4)
Cholesterol, Total: 151 mg/dL (ref 100–199)
HDL: 40 mg/dL (ref >39)
LDL Chol Calc (NIH): 97 mg/dL (ref 0–99)
Triglycerides: 69 mg/dL (ref 0–149)
VLDL Cholesterol Cal: 14 mg/dL (ref 5–40)

## 2021-06-19 LAB — B12 AND FOLATE PANEL
Folate: 10 ng/mL (ref >3.0)
Vitamin B-12: 530 pg/mL (ref 232–1245)

## 2021-06-19 LAB — CMP14+EGFR
ALT: 9 IU/L (ref 0–32)
AST: 19 IU/L (ref 0–40)
Albumin/Globulin Ratio: 1.9 (ref 1.2–2.2)
Albumin: 4.8 g/dL (ref 3.8–4.8)
Alkaline Phosphatase: 61 IU/L (ref 44–121)
BUN/Creatinine Ratio: 12 (ref 9–23)
BUN: 9 mg/dL (ref 6–20)
Bilirubin Total: 0.3 mg/dL (ref 0.0–1.2)
CO2: 23 mmol/L (ref 20–29)
Calcium: 9.1 mg/dL (ref 8.7–10.2)
Chloride: 106 mmol/L (ref 96–106)
Creatinine, Ser: 0.76 mg/dL (ref 0.57–1.00)
Globulin, Total: 2.5 g/dL (ref 1.5–4.5)
Glucose: 87 mg/dL (ref 70–99)
Potassium: 3.8 mmol/L (ref 3.5–5.2)
Sodium: 141 mmol/L (ref 134–144)
Total Protein: 7.3 g/dL (ref 6.0–8.5)
eGFR: 103 mL/min/{1.73_m2} (ref >59)

## 2021-06-19 LAB — CBC WITH DIFFERENTIAL/PLATELET
Basophils Absolute: 0 10*3/uL (ref 0.0–0.2)
Basos: 1 %
EOS (ABSOLUTE): 0.6 10*3/uL — ABNORMAL HIGH (ref 0.0–0.4)
Eos: 12 %
Hematocrit: 32 % — ABNORMAL LOW (ref 34.0–46.6)
Hemoglobin: 8.8 g/dL — ABNORMAL LOW (ref 11.1–15.9)
Immature Grans (Abs): 0 10*3/uL (ref 0.0–0.1)
Immature Granulocytes: 0 %
Lymphocytes Absolute: 1.4 10*3/uL (ref 0.7–3.1)
Lymphs: 27 %
MCH: 17.6 pg — ABNORMAL LOW (ref 26.6–33.0)
MCHC: 27.5 g/dL — ABNORMAL LOW (ref 31.5–35.7)
MCV: 64 fL — ABNORMAL LOW (ref 79–97)
Monocytes Absolute: 0.4 10*3/uL (ref 0.1–0.9)
Monocytes: 7 %
Neutrophils Absolute: 2.8 10*3/uL (ref 1.4–7.0)
Neutrophils: 53 %
Platelets: 201 10*3/uL (ref 150–450)
RBC: 4.99 x10E6/uL (ref 3.77–5.28)
RDW: 19.2 % — ABNORMAL HIGH (ref 11.7–15.4)
WBC: 5.3 10*3/uL (ref 3.4–10.8)

## 2021-06-19 LAB — IRON,TIBC AND FERRITIN PANEL
Ferritin: 2 ng/mL — ABNORMAL LOW (ref 15–150)
Iron Saturation: 4 % — CL (ref 15–55)
Iron: 19 ug/dL — ABNORMAL LOW (ref 27–159)
Total Iron Binding Capacity: 426 ug/dL (ref 250–450)
UIBC: 407 ug/dL (ref 131–425)

## 2021-06-19 LAB — HEMOGLOBIN A1C
Est. average glucose Bld gHb Est-mCnc: 120 mg/dL
Hgb A1c MFr Bld: 5.8 % — ABNORMAL HIGH (ref 4.8–5.6)

## 2021-06-19 LAB — HEPATITIS C ANTIBODY: Hep C Virus Ab: NONREACTIVE

## 2021-06-25 ENCOUNTER — Telehealth: Payer: Self-pay | Admitting: Nurse Practitioner

## 2021-06-25 NOTE — Telephone Encounter (Signed)
[facility] calling to let provider know that [name] is also booked out along with [facility] location. [facility] will send referral to [facility] and they will contact patient.  ?

## 2021-06-27 ENCOUNTER — Encounter: Payer: Self-pay | Admitting: Nurse Practitioner

## 2021-06-27 ENCOUNTER — Other Ambulatory Visit: Payer: Self-pay | Admitting: Nurse Practitioner

## 2021-06-27 DIAGNOSIS — D508 Other iron deficiency anemias: Secondary | ICD-10-CM

## 2021-06-27 LAB — IFOBT (OCCULT BLOOD): IFOBT: NEGATIVE

## 2021-07-01 ENCOUNTER — Other Ambulatory Visit: Payer: Self-pay | Admitting: Family

## 2021-07-01 ENCOUNTER — Encounter: Payer: Self-pay | Admitting: Family

## 2021-07-01 ENCOUNTER — Inpatient Hospital Stay: Payer: Medicaid Other | Attending: Hematology & Oncology

## 2021-07-01 ENCOUNTER — Inpatient Hospital Stay (HOSPITAL_BASED_OUTPATIENT_CLINIC_OR_DEPARTMENT_OTHER): Payer: Medicaid Other | Admitting: Family

## 2021-07-01 DIAGNOSIS — D500 Iron deficiency anemia secondary to blood loss (chronic): Secondary | ICD-10-CM | POA: Diagnosis not present

## 2021-07-01 DIAGNOSIS — D509 Iron deficiency anemia, unspecified: Secondary | ICD-10-CM | POA: Insufficient documentation

## 2021-07-01 DIAGNOSIS — D649 Anemia, unspecified: Secondary | ICD-10-CM

## 2021-07-01 DIAGNOSIS — Z800 Family history of malignant neoplasm of digestive organs: Secondary | ICD-10-CM | POA: Insufficient documentation

## 2021-07-01 DIAGNOSIS — Z87891 Personal history of nicotine dependence: Secondary | ICD-10-CM

## 2021-07-01 LAB — SAVE SMEAR(SSMR), FOR PROVIDER SLIDE REVIEW

## 2021-07-01 LAB — IRON AND IRON BINDING CAPACITY (CC-WL,HP ONLY)
Iron: 13 ug/dL — ABNORMAL LOW (ref 28–170)
Saturation Ratios: 3 % — ABNORMAL LOW (ref 10.4–31.8)
TIBC: 472 ug/dL — ABNORMAL HIGH (ref 250–450)
UIBC: 459 ug/dL

## 2021-07-01 LAB — CBC WITH DIFFERENTIAL (CANCER CENTER ONLY)
Abs Immature Granulocytes: 0.02 10*3/uL (ref 0.00–0.07)
Basophils Absolute: 0.1 10*3/uL (ref 0.0–0.1)
Basophils Relative: 1 %
Eosinophils Absolute: 0.6 10*3/uL — ABNORMAL HIGH (ref 0.0–0.5)
Eosinophils Relative: 9 %
HCT: 28.8 % — ABNORMAL LOW (ref 36.0–46.0)
Hemoglobin: 7.8 g/dL — ABNORMAL LOW (ref 12.0–15.0)
Immature Granulocytes: 0 %
Lymphocytes Relative: 20 %
Lymphs Abs: 1.3 10*3/uL (ref 0.7–4.0)
MCH: 17.6 pg — ABNORMAL LOW (ref 26.0–34.0)
MCHC: 27.1 g/dL — ABNORMAL LOW (ref 30.0–36.0)
MCV: 64.9 fL — ABNORMAL LOW (ref 80.0–100.0)
Monocytes Absolute: 0.5 10*3/uL (ref 0.1–1.0)
Monocytes Relative: 7 %
Neutro Abs: 4.1 10*3/uL (ref 1.7–7.7)
Neutrophils Relative %: 63 %
Platelet Count: 246 10*3/uL (ref 150–400)
RBC: 4.44 MIL/uL (ref 3.87–5.11)
RDW: 18.6 % — ABNORMAL HIGH (ref 11.5–15.5)
WBC Count: 6.5 10*3/uL (ref 4.0–10.5)
nRBC: 0 % (ref 0.0–0.2)

## 2021-07-01 LAB — CMP (CANCER CENTER ONLY)
ALT: 6 U/L (ref 0–44)
AST: 20 U/L (ref 15–41)
Albumin: 3.9 g/dL (ref 3.5–5.0)
Alkaline Phosphatase: 44 U/L (ref 38–126)
Anion gap: 5 (ref 5–15)
BUN: 11 mg/dL (ref 6–20)
CO2: 25 mmol/L (ref 22–32)
Calcium: 8.9 mg/dL (ref 8.9–10.3)
Chloride: 107 mmol/L (ref 98–111)
Creatinine: 0.71 mg/dL (ref 0.44–1.00)
GFR, Estimated: >60 mL/min (ref >60)
Glucose, Bld: 92 mg/dL (ref 70–99)
Potassium: 3.8 mmol/L (ref 3.5–5.1)
Sodium: 137 mmol/L (ref 135–145)
Total Bilirubin: 0.3 mg/dL (ref 0.3–1.2)
Total Protein: 7.3 g/dL (ref 6.5–8.1)

## 2021-07-01 LAB — RETICULOCYTES
Immature Retic Fract: 20.9 % — ABNORMAL HIGH (ref 2.3–15.9)
RBC.: 4.38 MIL/uL (ref 3.87–5.11)
Retic Count, Absolute: 53.9 10*3/uL (ref 19.0–186.0)
Retic Ct Pct: 1.2 % (ref 0.4–3.1)

## 2021-07-01 LAB — FERRITIN: Ferritin: 1 ng/mL — ABNORMAL LOW (ref 11–307)

## 2021-07-01 NOTE — Progress Notes (Signed)
Hematology/Oncology Consultation  ? ?Name: [name]      MRN: [id number]    Location: Room/bed info not found  Date: 07/01/2021 Time:3:27 PM ? ? ?REFERRING PHYSICIAN:  Alvis LemmingsLeonna Ameduite, NP ? ?REASON FOR CONSULT:  Iron deficiency anemia  ?  ?DIAGNOSIS: Iron deficiency anemia  ? ?HISTORY OF PRESENT ILLNESS: Ms. [name] is a very pleasant 36 yo African American female with a long history of iron deficiency anemia.  ?She received IV iron in middle school and also required a blood transfusion after miscarrying twins at 14 weeks.   ?She has a heavy cycle and notes large clots.  ?No other blood loss noted. No abnormal bruising, no petechiae.  ?Stool testing for occult blood last week was negative.  ?She is symptomatic with fatigue, pica craving ice, flour and corn starch. She also notes chest heaviness at times.  ?She states that she thinks her mother also has history of anemia.    ?No fever, chills, n/v, cough, rash, dizziness, SOB, chest pain, palpitations, abdominal pain or changes in bowel or bladder habits.  ?She states that she has intermittent lower back and pelvic pain and has been battling several episodes of vaginitis.  ?She has chronic constipation and takes Linzess.  ?She states that she is prediabetic. No history of thyroid disease.  ?No swelling, tenderness, numbness or tingling in her extremities.  ?No falls or syncope.  ?No personal history of cancer. She had a paternal aunt with an unknown primary, possibly leukemia.  ?She has 6 living children.  ?She is under a good bit of stress at home. Her sweet father passed away 5 months ago and her cousin was murdered last month. Also, her young daughter is being tested for autism.  ?She had a right shoulder replacement in January and is out of work for recovery.  ?No smoking or recreational drug use. She has the rare alcoholic beverage socially.  ?Appetite is good and she is doing her best to stay well hydrated. Her weight is stable at 164 lbs.  ? ?ROS: All other 10  point review of systems is negative.  ? ?PAST MEDICAL HISTORY:   ?Past Medical History:  ?Diagnosis Date  ? Anemia   ? Blood transfusion without reported diagnosis   ? Dislocated shoulder 03/31/2013  ? recurrant   ? IBS (irritable bowel syndrome)   ? with constipation  ? Shoulder dislocation, recurrent   ? ? ?ALLERGIES: ?Allergies  ?Allergen Reactions  ? Sulfa Antibiotics Rash  ? ?   ?MEDICATIONS:  ?Current Outpatient Medications on File Prior to Visit  ?Medication Sig Dispense Refill  ? acetaminophen (TYLENOL) 500 MG tablet Take 500 mg by mouth every 6 (six) hours as needed.    ? linaclotide (LINZESS) 72 MCG capsule Take 1 capsule (72 mcg total) by mouth daily before breakfast. 90 capsule 1  ? tinidazole (TINDAMAX) 500 MG tablet TAKE (4) TABLETS TODAY, THEN TAKE (4) TABLETS IN THE MORNING (Patient not taking: Reported on 04/09/2021) 8 tablet 0  ? ?No current facility-administered medications on file prior to visit.  ? ?  ?PAST SURGICAL HISTORY ?Past Surgical History:  ?Procedure Laterality Date  ? DILATION AND CURETTAGE OF UTERUS  10/11/2017  ? incomplete abortion  ? DILATION AND EVACUATION N/A 10/11/2017  ? Procedure: suction dilation and evacuation;  Surgeon: Conan Bowensavis, Kelly M, MD;  Location: [facility];  Service: Gynecology;  Laterality: N/A;  ? SHOULDER ARTHROSCOPY WITH LABRAL REPAIR Right 03/21/2021  ? Procedure: SHOULDER ARTHROSCOPY WITH LABRAL REPAIR;  Surgeon:  Oliver Barre, MD;  Location: [facility];  Service: Orthopedics;  Laterality: Right;  ? TUBAL LIGATION Bilateral 02/21/2017  ? pt decided to not have this done  ? TUBAL LIGATION Bilateral 01/08/2019  ? Procedure: POST PARTUM TUBAL LIGATION;  Surgeon: Jane Bing, MD;  Location: [facility];  Service: Gynecology;  Laterality: Bilateral;  ? WISDOM TOOTH EXTRACTION    ? ? ?FAMILY HISTORY: ?Family History  ?Problem Relation Age of Onset  ? Diabetes Sister   ? Cancer Paternal Grandmother   ?     breast  ? Asthma Son   ? Diabetes Maternal Aunt   ? Cancer Paternal  Aunt   ?     cervical, breast  ? Colon cancer Neg Hx   ? ? ?SOCIAL HISTORY: ? reports that she quit smoking about 16 years ago. Her smoking use included cigarettes. She has never used smokeless tobacco. She reports that she does not drink alcohol and does not use drugs. ? ?PERFORMANCE STATUS: ?The patient's performance status is 1 - Symptomatic but completely ambulatory ? ?PHYSICAL EXAM: ?Most Recent Vital Signs: Blood pressure 117/76, pulse 75, temperature 98.1 ?F (36.7 ?C), temperature source Oral, resp. rate 17, height 5\' 4"  (1.626 m), weight 164 lb 6.4 oz (74.6 kg), SpO2 100 %. ?BP 117/76 (BP Location: Left Arm, Patient Position: Sitting)   Pulse 75   Temp 98.1 ?F (36.7 ?C) (Oral)   Resp 17   Ht 5\' 4"  (1.626 m)   Wt 164 lb 6.4 oz (74.6 kg)   SpO2 100%   BMI 28.22 kg/m?  ? ?General Appearance:    Alert, cooperative, no distress, appears stated age  ?Head:    Normocephalic, without obvious abnormality, atraumatic  ?Eyes:    PERRL, conjunctiva/corneas clear, EOM's intact, fundi  ?  benign, both eyes  ?   ?   ?Throat:   Lips, mucosa, and tongue normal; teeth and gums normal  ?Neck:   Supple, symmetrical, trachea midline, no adenopathy;  ?  thyroid:  no enlargement/tenderness/nodules; no carotid ?  bruit or JVD  ?Back:     Symmetric, no curvature, ROM normal, no CVA tenderness  ?Lungs:     Clear to auscultation bilaterally, respirations unlabored  ?Chest Wall:    No tenderness or deformity  ? Heart:    Regular rate and rhythm, S1 and S2 normal, no murmur, rub   or gallop  ?   ?Abdomen:     Soft, non-tender, bowel sounds active all four quadrants,  ?  no masses, no organomegaly  ?   ?   ?Extremities:   Extremities normal, atraumatic, no cyanosis or edema  ?Pulses:   2+ and symmetric all extremities  ?Skin:   Skin color, texture, turgor normal, no rashes or lesions  ?Lymph nodes:   Cervical, supraclavicular, and axillary nodes normal  ?Neurologic:   CNII-XII intact, normal strength, sensation and reflexes  ?   throughout  ? ? ?LABORATORY DATA:  ?Results for orders placed or performed in visit on 07/01/21 (from the past 48 hour(s))  ?CBC with Differential (Cancer Center Only)     Status: Abnormal (Preliminary result)  ? Collection Time: 07/01/21  2:53 PM  ?Result Value Ref Range  ? WBC Count 6.5 4.0 - 10.5 K/uL  ? RBC 4.44 3.87 - 5.11 MIL/uL  ? Hemoglobin 7.8 (L) 12.0 - 15.0 g/dL  ?  Comment: Reticulocyte Hemoglobin testing ?may be clinically indicated, ?consider ordering this additional ?test 08/31/21 ?  ? HCT 28.8 (L) 36.0 -  46.0 %  ? MCV 64.9 (L) 80.0 - 100.0 fL  ? MCH 17.6 (L) 26.0 - 34.0 pg  ? MCHC 27.1 (L) 30.0 - 36.0 g/dL  ? RDW 18.6 (H) 11.5 - 15.5 %  ? Platelet Count 246 150 - 400 K/uL  ? nRBC 0.0 0.0 - 0.2 %  ?  Comment: Performed at [facility] at [facility], [address] [place], [place] [address]  ? Neutrophils Relative % PENDING %  ? Neutro Abs PENDING 1.7 - 7.7 K/uL  ? Band Neutrophils PENDING %  ? Lymphocytes Relative PENDING %  ? Lymphs Abs PENDING 0.7 - 4.0 K/uL  ? Monocytes Relative PENDING %  ? Monocytes Absolute PENDING 0.1 - 1.0 K/uL  ? Eosinophils Relative PENDING %  ? Eosinophils Absolute PENDING 0.0 - 0.5 K/uL  ? Basophils Relative PENDING %  ? Basophils Absolute PENDING 0.0 - 0.1 K/uL  ? WBC Morphology PENDING   ? RBC Morphology PENDING   ? Smear Review PENDING   ? Other PENDING %  ? nRBC PENDING 0 /100 WBC  ? Metamyelocytes Relative PENDING %  ? Myelocytes PENDING %  ? Promyelocytes Relative PENDING %  ? Blasts PENDING %  ? Immature Granulocytes PENDING %  ? Abs Immature Granulocytes PENDING 0.00 - 0.07 K/uL  ?Reticulocytes     Status: Abnormal  ? Collection Time: 07/01/21  2:53 PM  ?Result Value Ref Range  ? Retic Ct Pct 1.2 0.4 - 3.1 %  ? RBC. 4.38 3.87 - 5.11 MIL/uL  ? Retic Count, Absolute 53.9 19.0 - 186.0 K/uL  ? Immature Retic Fract 20.9 (H) 2.3 - 15.9 %  ?  Comment: Performed at [facility] at [facility], [address]  [place], [place] [address]  ?   ? ?RADIOGRAPHY: ?No results found.   ?  ?PATHOLOGY: None ? ?ASSESSMENT/PLAN: Ms. Raggio is a very pleasant 36 yo African American female with a long history of iron deficiency anemia secon

## 2021-07-02 ENCOUNTER — Encounter: Payer: Self-pay | Admitting: Family

## 2021-07-02 NOTE — Telephone Encounter (Signed)
Pt stated she had a death in the family. Appt canceled for today. ?

## 2021-07-03 LAB — ERYTHROPOIETIN: Erythropoietin: 128.4 m[IU]/mL — ABNORMAL HIGH (ref 2.6–18.5)

## 2021-07-05 LAB — HGB FRACTIONATION CASCADE
Hgb A2: 2.1 % (ref 1.8–3.2)
Hgb A: 97.9 % (ref 96.4–98.8)
Hgb F: 0 % (ref 0.0–2.0)
Hgb S: 0 %

## 2021-07-09 ENCOUNTER — Inpatient Hospital Stay: Payer: Medicaid Other

## 2021-07-09 VITALS — BP 102/54 | HR 62 | Temp 97.8°F | Resp 18

## 2021-07-09 DIAGNOSIS — Z800 Family history of malignant neoplasm of digestive organs: Secondary | ICD-10-CM | POA: Diagnosis not present

## 2021-07-09 DIAGNOSIS — Z87891 Personal history of nicotine dependence: Secondary | ICD-10-CM | POA: Diagnosis not present

## 2021-07-09 DIAGNOSIS — D509 Iron deficiency anemia, unspecified: Secondary | ICD-10-CM

## 2021-07-09 DIAGNOSIS — D500 Iron deficiency anemia secondary to blood loss (chronic): Secondary | ICD-10-CM | POA: Diagnosis not present

## 2021-07-09 MED ORDER — SODIUM CHLORIDE 0.9 % IV SOLN: Freq: Once | INTRAVENOUS | Status: AC

## 2021-07-09 MED ORDER — SODIUM CHLORIDE 0.9 % IV SOLN
300.0000 mg | Freq: Once | INTRAVENOUS | Status: AC
  Administered 2021-07-09: 300 mg via INTRAVENOUS
  Filled 2021-07-09: qty 300

## 2021-07-09 NOTE — Patient Instructions (Signed)
[facility]  Discharge Instructions: Thank you for choosing [facility] to provide your oncology and hematology care.   If you have a lab appointment with the Cancer Center, please go directly to the Cancer Center and check in at the registration area.  Wear comfortable clothing and clothing appropriate for easy access to any Portacath or PICC line.   We strive to give you quality time with your provider. You may need to reschedule your appointment if you arrive late (15 or more minutes).  Arriving late affects you and other patients whose appointments are after yours.  Also, if you miss three or more appointments without notifying the office, you may be dismissed from the clinic at the provider's discretion.      For prescription refill requests, have your pharmacy contact our office and allow 72 hours for refills to be completed.    Today you received the following chemotherapy and/or immunotherapy agents Venofer.   To help prevent nausea and vomiting after your treatment, we encourage you to take your nausea medication as directed.  BELOW ARE SYMPTOMS THAT SHOULD BE REPORTED IMMEDIATELY: *FEVER GREATER THAN 100.4 F (38 C) OR HIGHER *CHILLS OR SWEATING *NAUSEA AND VOMITING THAT IS NOT CONTROLLED WITH YOUR NAUSEA MEDICATION *UNUSUAL SHORTNESS OF BREATH *UNUSUAL BRUISING OR BLEEDING *URINARY PROBLEMS (pain or burning when urinating, or frequent urination) *BOWEL PROBLEMS (unusual diarrhea, constipation, pain near the anus) TENDERNESS IN MOUTH AND THROAT WITH OR WITHOUT PRESENCE OF ULCERS (sore throat, sores in mouth, or a toothache) UNUSUAL RASH, SWELLING OR PAIN  UNUSUAL VAGINAL DISCHARGE OR ITCHING   Items with * indicate a potential emergency and should be followed up as soon as possible or go to the Emergency Department if any problems should occur.  Please show the CHEMOTHERAPY ALERT CARD or IMMUNOTHERAPY ALERT CARD at check-in to the  Emergency Department and triage nurse. Should you have questions after your visit or need to cancel or reschedule your appointment, please contact [facility]  [phone] and follow the prompts.  Office hours are 8:00 a.m. to 4:30 p.m. Monday - Friday. Please note that voicemails left after 4:00 p.m. may not be returned until the following business day.  We are closed weekends and major holidays. You have access to a nurse at all times for urgent questions. Please call the main number to the clinic [phone] and follow the prompts.  For any non-urgent questions, you may also contact your provider using MyChart. We now offer e-Visits for anyone 18 and older to request care online for non-urgent symptoms. For details visit [email or web address].   Also download the MyChart app! Go to the app store, search "MyChart", open the app, select , and log in with your MyChart username and password.  Due to Covid, a mask is required upon entering the hospital/clinic. If you do not have a mask, one will be given to you upon arrival. For doctor visits, patients may have 1 support person aged 18 or older with them. For treatment visits, patients cannot have anyone with them due to current Covid guidelines and our immunocompromised population.  

## 2021-07-10 LAB — ALPHA-THALASSEMIA GENOTYPR

## 2021-07-16 ENCOUNTER — Inpatient Hospital Stay: Payer: Medicaid Other

## 2021-07-16 VITALS — BP 108/63 | HR 56 | Temp 98.9°F | Resp 18

## 2021-07-16 DIAGNOSIS — Z87891 Personal history of nicotine dependence: Secondary | ICD-10-CM | POA: Diagnosis not present

## 2021-07-16 DIAGNOSIS — D509 Iron deficiency anemia, unspecified: Secondary | ICD-10-CM

## 2021-07-16 DIAGNOSIS — D500 Iron deficiency anemia secondary to blood loss (chronic): Secondary | ICD-10-CM | POA: Diagnosis not present

## 2021-07-16 DIAGNOSIS — Z800 Family history of malignant neoplasm of digestive organs: Secondary | ICD-10-CM | POA: Diagnosis not present

## 2021-07-16 MED ORDER — SODIUM CHLORIDE 0.9% FLUSH: 10.0000 mL | Freq: Once | INTRAVENOUS | Status: DC | PRN

## 2021-07-16 MED ORDER — SODIUM CHLORIDE 0.9% FLUSH: 3.0000 mL | Freq: Once | INTRAVENOUS | Status: DC | PRN

## 2021-07-16 MED ORDER — SODIUM CHLORIDE 0.9 % IV SOLN
300.0000 mg | Freq: Once | INTRAVENOUS | Status: AC
  Administered 2021-07-16: 300 mg via INTRAVENOUS
  Filled 2021-07-16: qty 300

## 2021-07-16 MED ORDER — SODIUM CHLORIDE 0.9 % IV SOLN: Freq: Once | INTRAVENOUS | Status: AC

## 2021-07-16 NOTE — Patient Instructions (Signed)

## 2021-08-06 ENCOUNTER — Inpatient Hospital Stay: Payer: Medicaid Other

## 2021-08-06 ENCOUNTER — Inpatient Hospital Stay: Payer: Medicaid Other | Admitting: Family

## 2021-08-09 ENCOUNTER — Encounter: Payer: Self-pay | Admitting: Nurse Practitioner

## 2021-08-12 ENCOUNTER — Inpatient Hospital Stay: Payer: Medicaid Other

## 2021-08-12 ENCOUNTER — Inpatient Hospital Stay: Payer: Medicaid Other | Admitting: Family

## 2021-08-15 DIAGNOSIS — R091 Pleurisy: Secondary | ICD-10-CM | POA: Diagnosis not present

## 2021-08-15 DIAGNOSIS — R079 Chest pain, unspecified: Secondary | ICD-10-CM | POA: Diagnosis not present

## 2021-08-20 ENCOUNTER — Inpatient Hospital Stay: Payer: Medicaid Other | Attending: Hematology & Oncology

## 2021-08-20 ENCOUNTER — Inpatient Hospital Stay: Payer: Medicaid Other | Admitting: Family

## 2021-08-20 ENCOUNTER — Inpatient Hospital Stay: Payer: Medicaid Other

## 2021-09-13 ENCOUNTER — Ambulatory Visit (INDEPENDENT_AMBULATORY_CARE_PROVIDER_SITE_OTHER): Payer: Medicaid Other | Admitting: Orthopedic Surgery

## 2021-09-13 ENCOUNTER — Encounter: Payer: Self-pay | Admitting: Orthopedic Surgery

## 2021-09-13 VITALS — BP 113/77 | HR 70 | Ht 64.0 in | Wt 164.0 lb

## 2021-09-13 DIAGNOSIS — M25311 Other instability, right shoulder: Secondary | ICD-10-CM | POA: Diagnosis not present

## 2021-09-13 NOTE — Progress Notes (Signed)
Orthopaedic Postop Note  Assessment: [name] is a 36 y.o. 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  She has full range of motion.  She still notes some pain in her shoulder, when she does too much activity.  Occasional apprehension, but nothing like it was prior to surgery.  We discussed returning to physical therapy, and she declined.  She should continue with the exercises that she had worked on previously.  She needs to be cognizant of the positioning of her arm.  Nothing further needed at this time.  If she has any further issues, she should return to clinic.   Follow-up: Return if symptoms worsen or fail to improve. XR at next visit: None  Subjective:  Chief Complaint  Patient presents with   Shoulder Pain    Right s/p surgery 03/21/21 improving has soreness at times but improving     History of Present Illness: [name] is a 36 y.o. female who presents following the above stated procedure.  Surgery was approximately 6 months ago.  She has done well.  Occasionally, she has pain consistent with too much activity.  She still has some brief moments of apprehension, depending on positioning of her arm.  Overall, she is pleased with her progress.  She continues to get better.  She is not taking anything for pain at this time.   Review of Systems: No fevers or chills No numbness or tingling No Chest Pain No shortness of breath   Objective: BP 113/77   Pulse 70   Ht 5\' 4"  (1.626 m)   Wt 164 lb (74.4 kg)   BMI 28.15 kg/m   Physical Exam:  Alert and oriented.  No acute distress.  Surgical incisions have healed well.  No surrounding erythema or drainage.  Full forward flexion.  Internal rotation T12.  40 degrees of external rotation at her side, compared to 60 on the contralateral side.  Fingers are warm and well-perfused.  Sensation is intact throughout the right  hand.  IMAGING: I personally ordered and reviewed the following images:  No new imaging obtained today.   , MD 09/13/2021 9:42 PM

## 2021-09-23 ENCOUNTER — Ambulatory Visit: Payer: Medicaid Other | Admitting: Nurse Practitioner

## 2021-10-02 ENCOUNTER — Ambulatory Visit: Payer: Medicaid Other | Admitting: Nurse Practitioner

## 2021-10-02 ENCOUNTER — Encounter: Payer: Self-pay | Admitting: Nurse Practitioner

## 2021-10-02 VITALS — BP 124/70 | HR 72 | Ht 64.0 in | Wt 171.6 lb

## 2021-10-02 DIAGNOSIS — D508 Other iron deficiency anemias: Secondary | ICD-10-CM | POA: Diagnosis not present

## 2021-10-02 NOTE — Progress Notes (Signed)
   Subjective:    Patient ID: [name], female    DOB: 02/02/1986, 36 y.o.   MRN: [id number]  HPI  Patient here for 3 month follow up for pica and anemia. Patient states she was seen by hematology and given iron infusions. Patient states that currently, the pica has gone away and she no longer has cravings for flour. Patient states that she feel a lot better.  Patient has no other concerns today.   Review of Systems  All other systems reviewed and are negative.      Objective:   Physical Exam Vitals reviewed.  Constitutional:      General: She is not in acute distress.    Appearance: Normal appearance. She is normal weight. She is not ill-appearing, toxic-appearing or diaphoretic.  HENT:     Head: Normocephalic and atraumatic.  Cardiovascular:     Rate and Rhythm: Normal rate and regular rhythm.     Pulses: Normal pulses.     Heart sounds: Normal heart sounds. No murmur heard. Pulmonary:     Effort: Pulmonary effort is normal. No respiratory distress.     Breath sounds: Normal breath sounds. No wheezing.  Musculoskeletal:     Comments: Grossly intact  Skin:    General: Skin is warm.     Capillary Refill: Capillary refill takes less than 2 seconds.  Neurological:     Mental Status: She is alert.     Comments: Grossly intact  Psychiatric:        Mood and Affect: Mood normal.        Behavior: Behavior normal.           Assessment & Plan:   1. Other iron deficiency anemia - Iron anemia stable - No further work up needed at this time - Return to clinic if pica or other symptoms reoccur.   - RTC in 3 months for physical and pap

## 2022-01-02 ENCOUNTER — Encounter: Payer: Medicaid Other | Admitting: Nurse Practitioner

## 2022-02-20 DIAGNOSIS — J019 Acute sinusitis, unspecified: Secondary | ICD-10-CM | POA: Diagnosis not present

## 2022-02-20 DIAGNOSIS — M940 Chondrocostal junction syndrome [Tietze]: Secondary | ICD-10-CM | POA: Diagnosis not present

## 2022-02-20 DIAGNOSIS — J069 Acute upper respiratory infection, unspecified: Secondary | ICD-10-CM | POA: Diagnosis not present

## 2022-02-25 ENCOUNTER — Ambulatory Visit
Admission: EM | Admit: 2022-02-25 | Discharge: 2022-02-25 | Disposition: A | Discharge status: Home / Self Care | Payer: Medicaid Other | Attending: Urgent Care | Admitting: Urgent Care

## 2022-02-25 ENCOUNTER — Encounter: Payer: Self-pay | Admitting: Emergency Medicine

## 2022-02-25 ENCOUNTER — Ambulatory Visit (INDEPENDENT_AMBULATORY_CARE_PROVIDER_SITE_OTHER): Payer: Medicaid Other

## 2022-02-25 ENCOUNTER — Encounter: Payer: Self-pay | Admitting: Family

## 2022-02-25 DIAGNOSIS — J209 Acute bronchitis, unspecified: Secondary | ICD-10-CM | POA: Diagnosis not present

## 2022-02-25 DIAGNOSIS — R079 Chest pain, unspecified: Secondary | ICD-10-CM

## 2022-02-25 DIAGNOSIS — R059 Cough, unspecified: Secondary | ICD-10-CM | POA: Diagnosis not present

## 2022-02-25 MED ORDER — FLUCONAZOLE 150 MG PO TABS: 150.0000 mg | ORAL_TABLET | ORAL | 0 refills | Status: DC

## 2022-02-25 MED ORDER — PROMETHAZINE-DM 6.25-15 MG/5ML PO SYRP: 2.5000 mL | ORAL_SOLUTION | Freq: Three times a day (TID) | ORAL | 0 refills | Status: DC | PRN

## 2022-02-25 MED ORDER — VENTOLIN HFA 108 (90 BASE) MCG/ACT IN AERS: 1.0000 | INHALATION_SPRAY | Freq: Four times a day (QID) | RESPIRATORY_TRACT | 0 refills | Status: DC | PRN

## 2022-02-25 MED ORDER — PREDNISONE 20 MG PO TABS: 40.0000 mg | ORAL_TABLET | Freq: Every day | ORAL | 0 refills | Status: DC

## 2022-02-25 NOTE — ED Provider Notes (Signed)
[facility]  Note:  This document was prepared using Dragon voice recognition software and may include unintentional dictation errors.  MRN: [id number] DOB: [birth year]  Subjective:   [name] is a 37 y.o. female presenting for 10 day history of acute onset shortness of breath, chest pain, chest tightness, chest pressure. Has a remote history of asthma. No smoking. Was seen 3-4 days ago, was treated with naproxen and amoxicillin and has not gotten better, feels worse.   No current facility-administered medications for this encounter.  Current Outpatient Medications:    acetaminophen (TYLENOL) 500 MG tablet, Take 500 mg by mouth every 6 (six) hours as needed., Disp: , Rfl:    linaclotide (LINZESS) 72 MCG capsule, Take 1 capsule (72 mcg total) by mouth daily before breakfast., Disp: 90 capsule, Rfl: 1   Allergies  Allergen Reactions   Sulfa Antibiotics Rash    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/31/2013   recurrant    IBS (irritable bowel syndrome)    with constipation   Shoulder dislocation, recurrent      Past Surgical History:  Procedure Laterality Date   DILATION AND CURETTAGE OF UTERUS  10/11/2017   incomplete abortion   DILATION AND EVACUATION N/A 10/11/2017   Procedure: suction dilation and evacuation;  Surgeon: Sloan Leiter, MD;  Location: [facility];  Service: Gynecology;  Laterality: N/A;   SHOULDER ARTHROSCOPY WITH LABRAL REPAIR Right 03/21/2021   Procedure: SHOULDER ARTHROSCOPY WITH LABRAL REPAIR;  Surgeon: Mordecai Rasmussen, MD;  Location: [facility];  Service: Orthopedics;  Laterality: Right;   TUBAL LIGATION Bilateral 02/21/2017   pt decided to not have this done   TUBAL LIGATION Bilateral 01/08/2019   Procedure: POST PARTUM TUBAL LIGATION;  Surgeon: Aletha Halim, MD;  Location: [facility];  Service: Gynecology;  Laterality: Bilateral;   WISDOM TOOTH EXTRACTION       Family History  Problem Relation Age of Onset   Diabetes Sister    Cancer Paternal Grandmother        breast   Asthma Son    Diabetes Maternal Aunt    Cancer Paternal Aunt        cervical, breast   Colon cancer Neg Hx     Social History   Tobacco Use   Smoking status: Former    Types: Cigarettes    Quit date: 07/22/2004    Years since quitting: 17.6   Smokeless tobacco: Never  Vaping Use   Vaping Use: Never used  Substance Use Topics   Alcohol use: No    Comment: none since +UPT   Drug use: No    ROS   Objective:   Vitals: BP 116/63 (BP Location: Right Arm)   Pulse 75   Temp 97.9 F (36.6 C) (Oral)   Resp 16   LMP 02/14/2022 (Exact Date)   SpO2 98%   Physical Exam Constitutional:      General: She is not in acute distress.    Appearance: Normal appearance. She is well-developed. She is not ill-appearing, toxic-appearing or diaphoretic.  HENT:     Head: Normocephalic and atraumatic.     Nose: Nose normal.     Mouth/Throat:     Mouth: Mucous membranes are moist.  Eyes:     General: No scleral icterus.       Right eye: No discharge.  Left eye: No discharge.     Extraocular Movements: Extraocular movements intact.  Cardiovascular:     Rate and Rhythm: Normal rate and regular rhythm.     Heart sounds: Normal heart sounds. No murmur heard.    No friction rub. No gallop.  Pulmonary:     Effort: Pulmonary effort is normal. No respiratory distress.     Breath sounds: No stridor. No wheezing, rhonchi or rales.  Chest:     Chest wall: No tenderness.  Skin:    General: Skin is warm and dry.  Neurological:     General: No focal deficit present.     Mental Status: She is alert and oriented to person, place, and time.  Psychiatric:        Mood and Affect: Mood normal.        Behavior: Behavior normal.    DG Chest 2 View  Result Date: 02/25/2022 CLINICAL DATA:  Chest pain and cough for 10 days. EXAM: CHEST - 2 VIEW COMPARISON:  02/19/2016  FINDINGS: The cardiac silhouette, mediastinal and hilar contours are normal. Mild peribronchial thickening could suggest bronchitis but no infiltrates or effusions. No pulmonary lesions or pneumothorax. The bony thorax is intact. IMPRESSION: Possible bronchitis but no infiltrates or effusions. Electronically Signed   By: Marijo Sanes M.D.   On: 02/25/2022 13:01    Assessment and Plan :   PDMP not reviewed this encounter.  1. Acute bronchitis, unspecified organism     Will manage for bronchitis with prednisone, albuterol inhaler. Use supportive care. Finish out amoxicillin as previously prescribed. Use fluconazole for yeast infection. Counseled patient on potential for adverse effects with medications prescribed/recommended today, ER and return-to-clinic precautions discussed, patient verbalized understanding.    Jaynee Eagles, PA-C 02/25/22 1310

## 2022-02-25 NOTE — ED Triage Notes (Signed)
Chest pain and SOB x 10 days Was seen at another urgent care on 12/28 was given naproxen and amoxil.  States she doesn't feel any better.    States SOB gets worse when moving around

## 2022-03-05 ENCOUNTER — Telehealth: Payer: Self-pay | Admitting: *Deleted

## 2022-03-05 NOTE — Telephone Encounter (Signed)
Called patient and lvm of upcoming appointments - requested call back to confirm. 

## 2022-03-10 ENCOUNTER — Inpatient Hospital Stay: Payer: Medicaid Other | Admitting: Family

## 2022-03-10 ENCOUNTER — Inpatient Hospital Stay: Payer: Medicaid Other | Attending: Hematology & Oncology

## 2022-04-24 ENCOUNTER — Encounter: Payer: Self-pay | Admitting: Radiology

## 2022-05-07 ENCOUNTER — Ambulatory Visit
Admission: EM | Admit: 2022-05-07 | Discharge: 2022-05-07 | Disposition: A | Discharge status: Home / Self Care | Payer: Medicaid Other | Attending: Nurse Practitioner | Admitting: Nurse Practitioner

## 2022-05-07 DIAGNOSIS — J069 Acute upper respiratory infection, unspecified: Secondary | ICD-10-CM

## 2022-05-07 LAB — POCT INFLUENZA A/B
Influenza A, POC: NEGATIVE
Influenza B, POC: NEGATIVE

## 2022-05-07 LAB — POCT RAPID STREP A (OFFICE): Rapid Strep A Screen: NEGATIVE

## 2022-05-07 NOTE — ED Triage Notes (Signed)
Pt reports she has a sore throat and cough x 3 days. Pt took theraflu which gave slight relief.

## 2022-05-07 NOTE — ED Provider Notes (Signed)
[facility]    CSN: [id number] Arrival date & time: 05/07/22  [id number]      History   Chief Complaint Chief Complaint  Patient presents with   Sore Throat    HPI [name] is a 37 y.o. female.   Patient presenting today with 3-day history of sore throat, cough, congestion.  Denies fever, chills, body aches, chest pain, shortness of breath, abdominal pain, nausea vomiting or diarrhea.  So far trying TheraFlu with mild temporary relief of symptoms.  Daughter is sick with similar symptoms.    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/31/2013   recurrant    IBS (irritable bowel syndrome)    with constipation   Shoulder dislocation, recurrent     Patient Active Problem List   Diagnosis Date Noted   IDA (iron deficiency anemia) 07/01/2021   Depression 06/18/2021   Mild dysplasia of cervix (CIN I) 06/18/2021   Undiagnosed cardiac murmurs 06/18/2021   History of IBS 10/26/2019   Constipation 10/26/2019   Acute bilateral low back pain without sciatica 10/26/2019   History of gestational diabetes 11/02/2018   IBS (irritable bowel syndrome) 10/25/2015    Past Surgical History:  Procedure Laterality Date   DILATION AND CURETTAGE OF UTERUS  10/11/2017   incomplete abortion   DILATION AND EVACUATION N/A 10/11/2017   Procedure: suction dilation and evacuation;  Surgeon: Sloan Leiter, MD;  Location: [facility];  Service: Gynecology;  Laterality: N/A;   SHOULDER ARTHROSCOPY WITH LABRAL REPAIR Right 03/21/2021   Procedure: SHOULDER ARTHROSCOPY WITH LABRAL REPAIR;  Surgeon: Mordecai Rasmussen, MD;  Location: [facility];  Service: Orthopedics;  Laterality: Right;   TUBAL LIGATION Bilateral 02/21/2017   pt decided to not have this done   TUBAL LIGATION Bilateral 01/08/2019   Procedure: POST PARTUM TUBAL LIGATION;  Surgeon: Aletha Halim, MD;  Location: [facility];  Service: Gynecology;  Laterality:  Bilateral;   WISDOM TOOTH EXTRACTION      OB History     Gravida  6   Para  5   Term  4   Preterm  1   AB  1   Living  6      SAB  1   IAB      Ectopic      Multiple  1   Live Births  6            Home Medications    Prior to Admission medications   Medication Sig Start Date End Date Taking? Authorizing Provider  acetaminophen (TYLENOL) 500 MG tablet Take 500 mg by mouth every 6 (six) hours as needed.    [provider]  albuterol (VENTOLIN HFA) 108 (90 Base) MCG/ACT inhaler Inhale 1-2 puffs into the lungs every 6 (six) hours as needed for wheezing or shortness of breath. 02/25/22   Jaynee Eagles, PA-C  fluconazole (DIFLUCAN) 150 MG tablet Take 1 tablet (150 mg total) by mouth every 3 (three) days. 02/25/22   Jaynee Eagles, PA-C  linaclotide [facility]) 72 MCG capsule Take 1 capsule (72 mcg total) by mouth daily before breakfast. 06/18/21   [facility]med[facility], Trenton Gammon, FNP  predniSONE (DELTASONE) 20 MG tablet Take 2 tablets (40 mg total) by mouth daily with breakfast. 02/25/22   Jaynee Eagles, PA-C  promethazine-dextromethorphan (PROMETHAZINE-DM) 6.25-15 MG/5ML syrup Take 2.5 mLs by mouth 3 (three) times daily as needed for cough. 02/25/22  Jaynee Eagles, PA-C    Family History Family History  Problem Relation Age of Onset   Diabetes Sister    Cancer Paternal Grandmother        breast   Asthma Son    Diabetes Maternal Aunt    Cancer Paternal Aunt        cervical, breast   Colon cancer Neg Hx     Social History Social History   Tobacco Use   Smoking status: Former    Types: Cigarettes    Quit date: 07/22/2004    Years since quitting: 17.8   Smokeless tobacco: Never  Vaping Use   Vaping Use: Never used  Substance Use Topics   Alcohol use: No    Comment: none since +UPT   Drug use: No     Allergies   Sulfa antibiotics   Review of Systems Review of Systems Per HPI  Physical Exam Triage Vital Signs ED Triage Vitals [05/07/22 0929]  Enc Vitals Group      BP 110/69     Pulse Rate 84     Resp 18     Temp 97.7 F (36.5 C)     Temp Source Oral     SpO2 97 %     Weight      Height      Head Circumference      Peak Flow      Pain Score 5     Pain Loc      Pain Edu?      Excl. in [place]?    No data found.  Updated Vital Signs BP 110/69 (BP Location: Right Arm)   Pulse 84   Temp 97.7 F (36.5 C) (Oral)   Resp 18   LMP 04/14/2022   SpO2 97%   Visual Acuity Right Eye Distance:   Left Eye Distance:   Bilateral Distance:    Right Eye Near:   Left Eye Near:    Bilateral Near:     Physical Exam Vitals and nursing note reviewed.  Constitutional:      Appearance: Normal appearance.  HENT:     Head: Atraumatic.     Right Ear: Tympanic membrane and external ear normal.     Left Ear: Tympanic membrane and external ear normal.     Nose: Rhinorrhea present.     Mouth/Throat:     Mouth: Mucous membranes are moist.     Pharynx: Posterior oropharyngeal erythema present.  Eyes:     Extraocular Movements: Extraocular movements intact.     Conjunctiva/sclera: Conjunctivae normal.  Cardiovascular:     Rate and Rhythm: Normal rate and regular rhythm.     Heart sounds: Normal heart sounds.  Pulmonary:     Effort: Pulmonary effort is normal.     Breath sounds: Normal breath sounds. No wheezing or rales.  Musculoskeletal:        General: Normal range of motion.     Cervical back: Normal range of motion and neck supple.  Skin:    General: Skin is warm and dry.  Neurological:     Mental Status: She is alert and oriented to person, place, and time.  Psychiatric:        Mood and Affect: Mood normal.        Thought Content: Thought content normal.      UC Treatments / Results  Labs (all labs ordered are listed, but only abnormal results are displayed) Labs Reviewed  POCT RAPID STREP A (OFFICE)  POCT  INFLUENZA A/B    EKG   Radiology No results found.  Procedures Procedures (including critical care time)  Medications  Ordered in UC Medications - No data to display  Initial Impression / Assessment and Plan / UC Course  I have reviewed the triage vital signs and the nursing notes.  Pertinent labs & imaging results that were available during my care of the patient were reviewed by me and considered in my medical decision making (see chart for details).     Rapid strep and rapid flu negative, vitals and exam very reassuring, suspect viral upper respiratory infection.  Discussed supportive over-the-counter medications, home care.  Return for worsening symptoms.  Final Clinical Impressions(s) / UC Diagnoses   Final diagnoses:  Viral URI   Discharge Instructions   None    ED Prescriptions   None    PDMP not reviewed this encounter.   Volney American, [place] 05/07/22 1017

## 2022-06-11 ENCOUNTER — Encounter: Payer: Self-pay | Admitting: Adult Health

## 2022-06-11 ENCOUNTER — Other Ambulatory Visit (HOSPITAL_COMMUNITY)
Admission: RE | Admit: 2022-06-11 | Discharge: 2022-06-11 | Disposition: A | Discharge status: Home / Self Care | Payer: Medicaid Other | Source: Ambulatory Visit | Attending: Adult Health | Admitting: Adult Health

## 2022-06-11 ENCOUNTER — Ambulatory Visit: Payer: Medicaid Other | Admitting: Adult Health

## 2022-06-11 VITALS — BP 118/74 | HR 79 | Ht 64.0 in | Wt 182.0 lb

## 2022-06-11 DIAGNOSIS — F5089 Other specified eating disorder: Secondary | ICD-10-CM | POA: Diagnosis not present

## 2022-06-11 DIAGNOSIS — R102 Pelvic and perineal pain unspecified side: Secondary | ICD-10-CM

## 2022-06-11 DIAGNOSIS — R0981 Nasal congestion: Secondary | ICD-10-CM | POA: Diagnosis not present

## 2022-06-11 DIAGNOSIS — Z124 Encounter for screening for malignant neoplasm of cervix: Secondary | ICD-10-CM | POA: Insufficient documentation

## 2022-06-11 DIAGNOSIS — Z3202 Encounter for pregnancy test, result negative: Secondary | ICD-10-CM | POA: Diagnosis not present

## 2022-06-11 LAB — POCT URINALYSIS DIPSTICK
Glucose, UA: NEGATIVE
Ketones, UA: NEGATIVE
Leukocytes, UA: NEGATIVE
Nitrite, UA: NEGATIVE
Protein, UA: NEGATIVE

## 2022-06-11 LAB — POCT URINE PREGNANCY: Preg Test, Ur: NEGATIVE

## 2022-06-11 NOTE — Progress Notes (Signed)
  Subjective:     Patient ID: [name], female   DOB: May 03, 1985, 37 y.o.   MRN: [id number]  HPI Janaia is a 37 year old black female, married, [id number] in complaining of pelvic pain, about 2-3 weeks ago, had what felt like a water balloon pop LLQ and then felt dizzy and had nausea. Was tender to touch on LLQ and still sore. Has nasal congestion too(used nasal spray) and headache. Has on chest pain that goes and comes too over last month. Had negative COVID test. And she needs a pap. Has PICA cravings for flour and ice.  PCP is Dr Adriana Simas.  Review of Systems +pelvic pain, about 2-3 weeks ago, had what felt like a water balloon pop LLQ and then felt dizzy and had nausea. Was tender to touch on LLQ and still sore. Has nasal congestion too and headache. Has on chest pain that goes and comes too over last month. Had negative COVID test. And she needs a pap. Has PICA cravings for flour and ice. Has had looser stools too Some discomfort with sex Reviewed past medical,surgical, social and family history. Reviewed medications and allergies.     Objective:   Physical Exam BP 118/74 (BP Location: Left Arm, Patient Position: Sitting, Cuff Size: Normal)   Pulse 79   Ht  (1.626 m)   Wt 182 lb (82.6 kg)   LMP 06/05/2022   BMI 31.24 kg/m  UPT is negative. Urine dipstick Moderate blood, on period Skin warm and dry. Lungs: clear to ausculation bilaterally. Cardiovascular: regular rate and rhythm.  Pelvic: external genitalia is normal in appearance no lesions, vagina: scant blood,urethra has no lesions or masses noted, cervix:smooth and bulbous, no CMT, pap with GC/CHL and HR HPV genotyping performed, uterus: normal size, shape and contour, mildly tender, no masses felt, adnexa: no masses, +LLQ tenderness noted. Bladder is non tender and no masses felt. Fall risk is low  Upstream - 06/11/22 0957       Pregnancy Intention Screening   Does the patient want to become pregnant in the next year? No     Does the patient's partner want to become pregnant in the next year? No    Would the patient like to discuss contraceptive options today? No      Contraception Wrap Up   Current Method Female Sterilization    End Method Female Sterilization            Examination chaperoned by Gladys Damme NP student  and assisted with exam.    Assessment:     1. Routine Papanicolaou smear Pap sent  - Cytology - PAP( [place])  2. Pregnancy examination or test, negative result - POCT urine pregnancy  3. Pelvic pain Had pain 2-3 weeks ago Sore now Discussed could be ovarian cyst that ruptured  Pelvic US scheduled for 06/23/22 at [facility] at 12:30 pm  - POCT Urinalysis Dipstick - US PELVIC COMPLETE WITH TRANSVAGINAL; Future  4. Nasal congestion Try allegra D,zyrtec D  5. Pica Eats flour and ice Check labs  - CBC - Iron, TIBC and Ferritin Panel     Plan:     Follow up TBD

## 2022-06-12 ENCOUNTER — Ambulatory Visit: Payer: Medicaid Other | Admitting: Adult Health

## 2022-06-12 ENCOUNTER — Other Ambulatory Visit: Payer: Self-pay | Admitting: Adult Health

## 2022-06-12 DIAGNOSIS — D509 Iron deficiency anemia, unspecified: Secondary | ICD-10-CM

## 2022-06-12 LAB — CBC
Hematocrit: 31 % — ABNORMAL LOW (ref 34.0–46.6)
Hemoglobin: 8.5 g/dL — ABNORMAL LOW (ref 11.1–15.9)
MCH: 19 pg — ABNORMAL LOW (ref 26.6–33.0)
MCHC: 27.4 g/dL — ABNORMAL LOW (ref 31.5–35.7)
MCV: 69 fL — ABNORMAL LOW (ref 79–97)
Platelets: 253 10*3/uL (ref 150–450)
RBC: 4.47 x10E6/uL (ref 3.77–5.28)
RDW: 18.4 % — ABNORMAL HIGH (ref 11.7–15.4)
WBC: 5.9 10*3/uL (ref 3.4–10.8)

## 2022-06-12 LAB — IRON,TIBC AND FERRITIN PANEL
Ferritin: 3 ng/mL — ABNORMAL LOW (ref 15–150)
Iron Saturation: 4 % — CL (ref 15–55)
Iron: 15 ug/dL — ABNORMAL LOW (ref 27–159)
Total Iron Binding Capacity: 422 ug/dL (ref 250–450)
UIBC: 407 ug/dL (ref 131–425)

## 2022-06-17 ENCOUNTER — Inpatient Hospital Stay (HOSPITAL_BASED_OUTPATIENT_CLINIC_OR_DEPARTMENT_OTHER): Payer: Medicaid Other | Admitting: Medical Oncology

## 2022-06-17 ENCOUNTER — Other Ambulatory Visit: Payer: Self-pay

## 2022-06-17 ENCOUNTER — Inpatient Hospital Stay: Payer: Medicaid Other | Attending: Hematology & Oncology

## 2022-06-17 ENCOUNTER — Encounter: Payer: Self-pay | Admitting: Medical Oncology

## 2022-06-17 VITALS — BP 109/63 | HR 68 | Temp 97.7°F | Resp 18 | Ht 64.0 in | Wt 180.0 lb

## 2022-06-17 DIAGNOSIS — D500 Iron deficiency anemia secondary to blood loss (chronic): Secondary | ICD-10-CM | POA: Insufficient documentation

## 2022-06-17 DIAGNOSIS — R5383 Other fatigue: Secondary | ICD-10-CM | POA: Diagnosis not present

## 2022-06-17 DIAGNOSIS — Z803 Family history of malignant neoplasm of breast: Secondary | ICD-10-CM | POA: Insufficient documentation

## 2022-06-17 DIAGNOSIS — F5089 Other specified eating disorder: Secondary | ICD-10-CM | POA: Insufficient documentation

## 2022-06-17 DIAGNOSIS — D509 Iron deficiency anemia, unspecified: Secondary | ICD-10-CM

## 2022-06-17 DIAGNOSIS — Z8049 Family history of malignant neoplasm of other genital organs: Secondary | ICD-10-CM | POA: Insufficient documentation

## 2022-06-17 LAB — CBC WITH DIFFERENTIAL (CANCER CENTER ONLY)
Abs Immature Granulocytes: 0.07 10*3/uL (ref 0.00–0.07)
Basophils Absolute: 0.1 10*3/uL (ref 0.0–0.1)
Basophils Relative: 1 %
Eosinophils Absolute: 0.9 10*3/uL — ABNORMAL HIGH (ref 0.0–0.5)
Eosinophils Relative: 14 %
HCT: 30.7 % — ABNORMAL LOW (ref 36.0–46.0)
Hemoglobin: 8.6 g/dL — ABNORMAL LOW (ref 12.0–15.0)
Immature Granulocytes: 1 %
Lymphocytes Relative: 23 %
Lymphs Abs: 1.6 10*3/uL (ref 0.7–4.0)
MCH: 18.9 pg — ABNORMAL LOW (ref 26.0–34.0)
MCHC: 28 g/dL — ABNORMAL LOW (ref 30.0–36.0)
MCV: 67.6 fL — ABNORMAL LOW (ref 80.0–100.0)
Monocytes Absolute: 0.5 10*3/uL (ref 0.1–1.0)
Monocytes Relative: 8 %
Neutro Abs: 3.6 10*3/uL (ref 1.7–7.7)
Neutrophils Relative %: 53 %
Platelet Count: 280 10*3/uL (ref 150–400)
RBC: 4.54 MIL/uL (ref 3.87–5.11)
RDW: 18.1 % — ABNORMAL HIGH (ref 11.5–15.5)
WBC Count: 6.7 10*3/uL (ref 4.0–10.5)
nRBC: 0 % (ref 0.0–0.2)

## 2022-06-17 LAB — RETICULOCYTES
Immature Retic Fract: 30.6 % — ABNORMAL HIGH (ref 2.3–15.9)
RBC.: 4.49 MIL/uL (ref 3.87–5.11)
Retic Count, Absolute: 69.1 10*3/uL (ref 19.0–186.0)
Retic Ct Pct: 1.5 % (ref 0.4–3.1)

## 2022-06-17 LAB — FERRITIN: Ferritin: 2 ng/mL — ABNORMAL LOW (ref 11–307)

## 2022-06-17 LAB — IRON AND IRON BINDING CAPACITY (CC-WL,HP ONLY)
Iron: 16 ug/dL — ABNORMAL LOW (ref 28–170)
Saturation Ratios: 3 % — ABNORMAL LOW (ref 10.4–31.8)
TIBC: 498 ug/dL — ABNORMAL HIGH (ref 250–450)
UIBC: 482 ug/dL — ABNORMAL HIGH (ref 148–442)

## 2022-06-17 NOTE — Progress Notes (Signed)
Hematology/Oncology Consultation   Name: [name]      MRN: [id number]    Location: Room/bed info not found  Date: 06/17/2022 Time:11:06 AM   REFERRING PHYSICIAN:  Teresa Coombs [facility], NP  REASON FOR CONSULT:  Iron deficiency anemia    DIAGNOSIS: Iron deficiency anemia   HISTORY OF PRESENT ILLNESS: [name] is a very pleasant 37 yo African American female with a long history of iron deficiency anemia.  She received IV iron in middle school and also required a blood transfusion after miscarrying twins at 14 weeks.   She has a heavy cycle and notes large clots.  No other blood loss noted. No abnormal bruising, no petechiae.  Stool testing for occult blood last week was negative.  She is symptomatic with fatigue, pica craving ice, flour and corn starch. She also notes chest heaviness at times.   At her last visit on 07/01/2021 she had labs showing normal hemoglobin and was negative for thalassemia. Today she reports that she has been ok. Still fatigued. Still having heavy menstrual cycles. Wearing both pads/tampons and changing them every 15-30 minutes while on cycles. She has not seen OB-GYN recently but is agreeable to schedule follow up.   She is not currently taking any oral iron.    ROS: All other 10 point review of systems is negative.   PAST MEDICAL HISTORY:   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/31/2013   recurrant    IBS (irritable bowel syndrome)    with constipation   Shoulder dislocation, recurrent     ALLERGIES: Allergies  Allergen Reactions   Sulfa Antibiotics Rash      MEDICATIONS:  Current Outpatient Medications on File Prior to Visit  Medication Sig Dispense Refill   acetaminophen (TYLENOL) 500 MG tablet Take 500 mg by mouth every 6 (six) hours as needed.     fluticasone (FLONASE ALLERGY RELIEF) 50 MCG/ACT nasal spray Place 1 spray into both nostrils daily.     linaclotide  (LINZESS) 72 MCG capsule Take 1 capsule (72 mcg total) by mouth daily before breakfast. 90 capsule 1   No current facility-administered medications on file prior to visit.     PAST SURGICAL HISTORY Past Surgical History:  Procedure Laterality Date   DILATION AND CURETTAGE OF UTERUS  10/11/2017   incomplete abortion   DILATION AND EVACUATION N/A 10/11/2017   Procedure: suction dilation and evacuation;  Surgeon: Conan Bowens, MD;  Location: [facility];  Service: Gynecology;  Laterality: N/A;   SHOULDER ARTHROSCOPY WITH LABRAL REPAIR Right 03/21/2021   Procedure: SHOULDER ARTHROSCOPY WITH LABRAL REPAIR;  Surgeon: Oliver Barre, MD;  Location: [facility];  Service: Orthopedics;  Laterality: Right;   TUBAL LIGATION Bilateral 02/21/2017   pt decided to not have this done   TUBAL LIGATION Bilateral 01/08/2019   Procedure: POST PARTUM TUBAL LIGATION;  Surgeon: Coppock Bing, MD;  Location: [facility];  Service: Gynecology;  Laterality: Bilateral;   WISDOM TOOTH EXTRACTION      FAMILY HISTORY: Family History  Problem Relation Age of Onset   Cancer Paternal Grandmother        breast   Diabetes Sister    Asthma Son    Diabetes Maternal Aunt    Cancer Paternal Aunt        cervical, breast   Colon cancer Neg Hx     SOCIAL HISTORY:  reports that she quit  smoking about 17 years ago. Her smoking use included cigarettes. She has never used smokeless tobacco. She reports that she does not drink alcohol and does not use drugs.  PERFORMANCE STATUS: The patient's performance status is 1 - Symptomatic but completely ambulatory  PHYSICAL EXAM: Most Recent Vital Signs: Last menstrual period 06/05/2022. LMP 06/05/2022   General Appearance:    Alert, cooperative, no distress, appears stated age  Head:    Normocephalic, without obvious abnormality, atraumatic  Eyes:    PERRL, conjunctiva/corneas clear, EOM's intact, fundi    benign, both eyes        Throat:   Lips, mucosa, and tongue normal; teeth  and gums normal  Neck:   Supple, symmetrical, trachea midline, no adenopathy;    thyroid:  no enlargement/tenderness/nodules; no carotid   bruit or JVD  Back:     Symmetric, no curvature, ROM normal, no CVA tenderness  Lungs:     Clear to auscultation bilaterally, respirations unlabored  Chest Wall:    No tenderness or deformity   Heart:    Regular rate and rhythm, S1 and S2 normal, no murmur, rub   or gallop     Abdomen:     Soft, non-tender, bowel sounds active all four quadrants,    no masses, no organomegaly        Extremities:   Extremities normal, atraumatic, no cyanosis or edema  Pulses:   2+ and symmetric all extremities  Skin:   Skin color, texture, turgor normal, no rashes or lesions  Lymph nodes:   Cervical, supraclavicular, and axillary nodes normal  Neurologic:   CNII-XII intact, normal strength, sensation and reflexes    throughout    LABORATORY DATA:  Results for orders placed or performed in visit on 06/17/22 (from the past 48 hour(s))  Reticulocytes     Status: Abnormal   Collection Time: 06/17/22 10:52 AM  Result Value Ref Range   Retic Ct Pct 1.5 0.4 - 3.1 %   RBC. 4.49 3.87 - 5.11 MIL/uL   Retic Count, Absolute 69.1 19.0 - 186.0 K/uL   Immature Retic Fract 30.6 (H) 2.3 - 15.9 %    Comment: Performed at [facility] at [facility], [address] [place], [place] [address]  CBC with Differential (Cancer Center Only)     Status: Abnormal   Collection Time: 06/17/22 10:52 AM  Result Value Ref Range   WBC Count 6.7 4.0 - 10.5 K/uL   RBC 4.54 3.87 - 5.11 MIL/uL   Hemoglobin 8.6 (L) 12.0 - 15.0 g/dL    Comment: Reticulocyte Hemoglobin testing may be clinically indicated, consider ordering this additional test [id number]    HCT 30.7 (L) 36.0 - 46.0 %   MCV 67.6 (L) 80.0 - 100.0 fL   MCH 18.9 (L) 26.0 - 34.0 pg   MCHC 28.0 (L) 30.0 - 36.0 g/dL   RDW 38.7 (H) 56.4 - 33.2 %   Platelet Count 280 150 - 400 K/uL   nRBC 0.0 0.0 - 0.2 %    Neutrophils Relative % 53 %   Neutro Abs 3.6 1.7 - 7.7 K/uL   Lymphocytes Relative 23 %   Lymphs Abs 1.6 0.7 - 4.0 K/uL   Monocytes Relative 8 %   Monocytes Absolute 0.5 0.1 - 1.0 K/uL   Eosinophils Relative 14 %   Eosinophils Absolute 0.9 (H) 0.0 - 0.5 K/uL   Basophils Relative 1 %   Basophils Absolute 0.1 0.0 - 0.1 K/uL  Immature Granulocytes 1 %   Abs Immature Granulocytes 0.07 0.00 - 0.07 K/uL    Comment: Performed at [facility] at [facility], [address] [place], [place] [address]      RADIOGRAPHY: No results found.     PATHOLOGY: None  ASSESSMENT/PLAN: Ms. Dinges is a very pleasant 37 yo [place] American female with a long history of iron deficiency anemia secondary to heavy cycles.  We reviewed her recent labs which shows continued anemia and suggestion of iron deficiency. I have suggested Venofer 300 mg weekly x 3 weeks total. Patient will contact her GYN to discuss menstrual cycle management as well.   Disposition: Venofer weekly x 3 weeks total RTC 8 weeks APP, labs (CBC w/, CMP, iron, ferritin, retic)   All questions were answered. The patient knows to call the clinic with any problems, questions or concerns. We can certainly see the patient much sooner if necessary.   Rushie Chestnut, PA-C

## 2022-06-18 LAB — CYTOLOGY - PAP
Chlamydia: NEGATIVE
Comment: NEGATIVE
Comment: NEGATIVE
Comment: NORMAL
Diagnosis: NEGATIVE
High risk HPV: NEGATIVE
Neisseria Gonorrhea: NEGATIVE

## 2022-06-23 ENCOUNTER — Ambulatory Visit (HOSPITAL_COMMUNITY): Payer: Medicaid Other | Attending: Adult Health

## 2022-06-27 ENCOUNTER — Inpatient Hospital Stay: Payer: Medicaid Other

## 2022-07-04 ENCOUNTER — Inpatient Hospital Stay: Payer: Medicaid Other

## 2022-07-11 ENCOUNTER — Inpatient Hospital Stay: Payer: Medicaid Other

## 2022-07-14 ENCOUNTER — Other Ambulatory Visit: Payer: Self-pay | Admitting: Adult Health

## 2022-07-14 MED ORDER — FLUCONAZOLE 150 MG PO TABS: ORAL_TABLET | ORAL | 1 refills | Status: DC

## 2022-07-14 NOTE — Progress Notes (Signed)
Will rx diflucan  

## 2022-07-18 ENCOUNTER — Inpatient Hospital Stay: Payer: Medicaid Other

## 2022-07-25 ENCOUNTER — Ambulatory Visit: Payer: Medicaid Other

## 2022-08-08 ENCOUNTER — Inpatient Hospital Stay: Payer: Medicaid Other

## 2022-08-13 IMAGING — DX DG SHOULDER 2+V*R*
2 series · 2 of 2 positions shown · non-contrast
Comparison: Earlier study today at [DATE] a.m.

CLINICAL DATA: Shoulder dislocation reduction film.

EXAM:
RIGHT SHOULDER - 2+ VIEW

[shoulder ap]
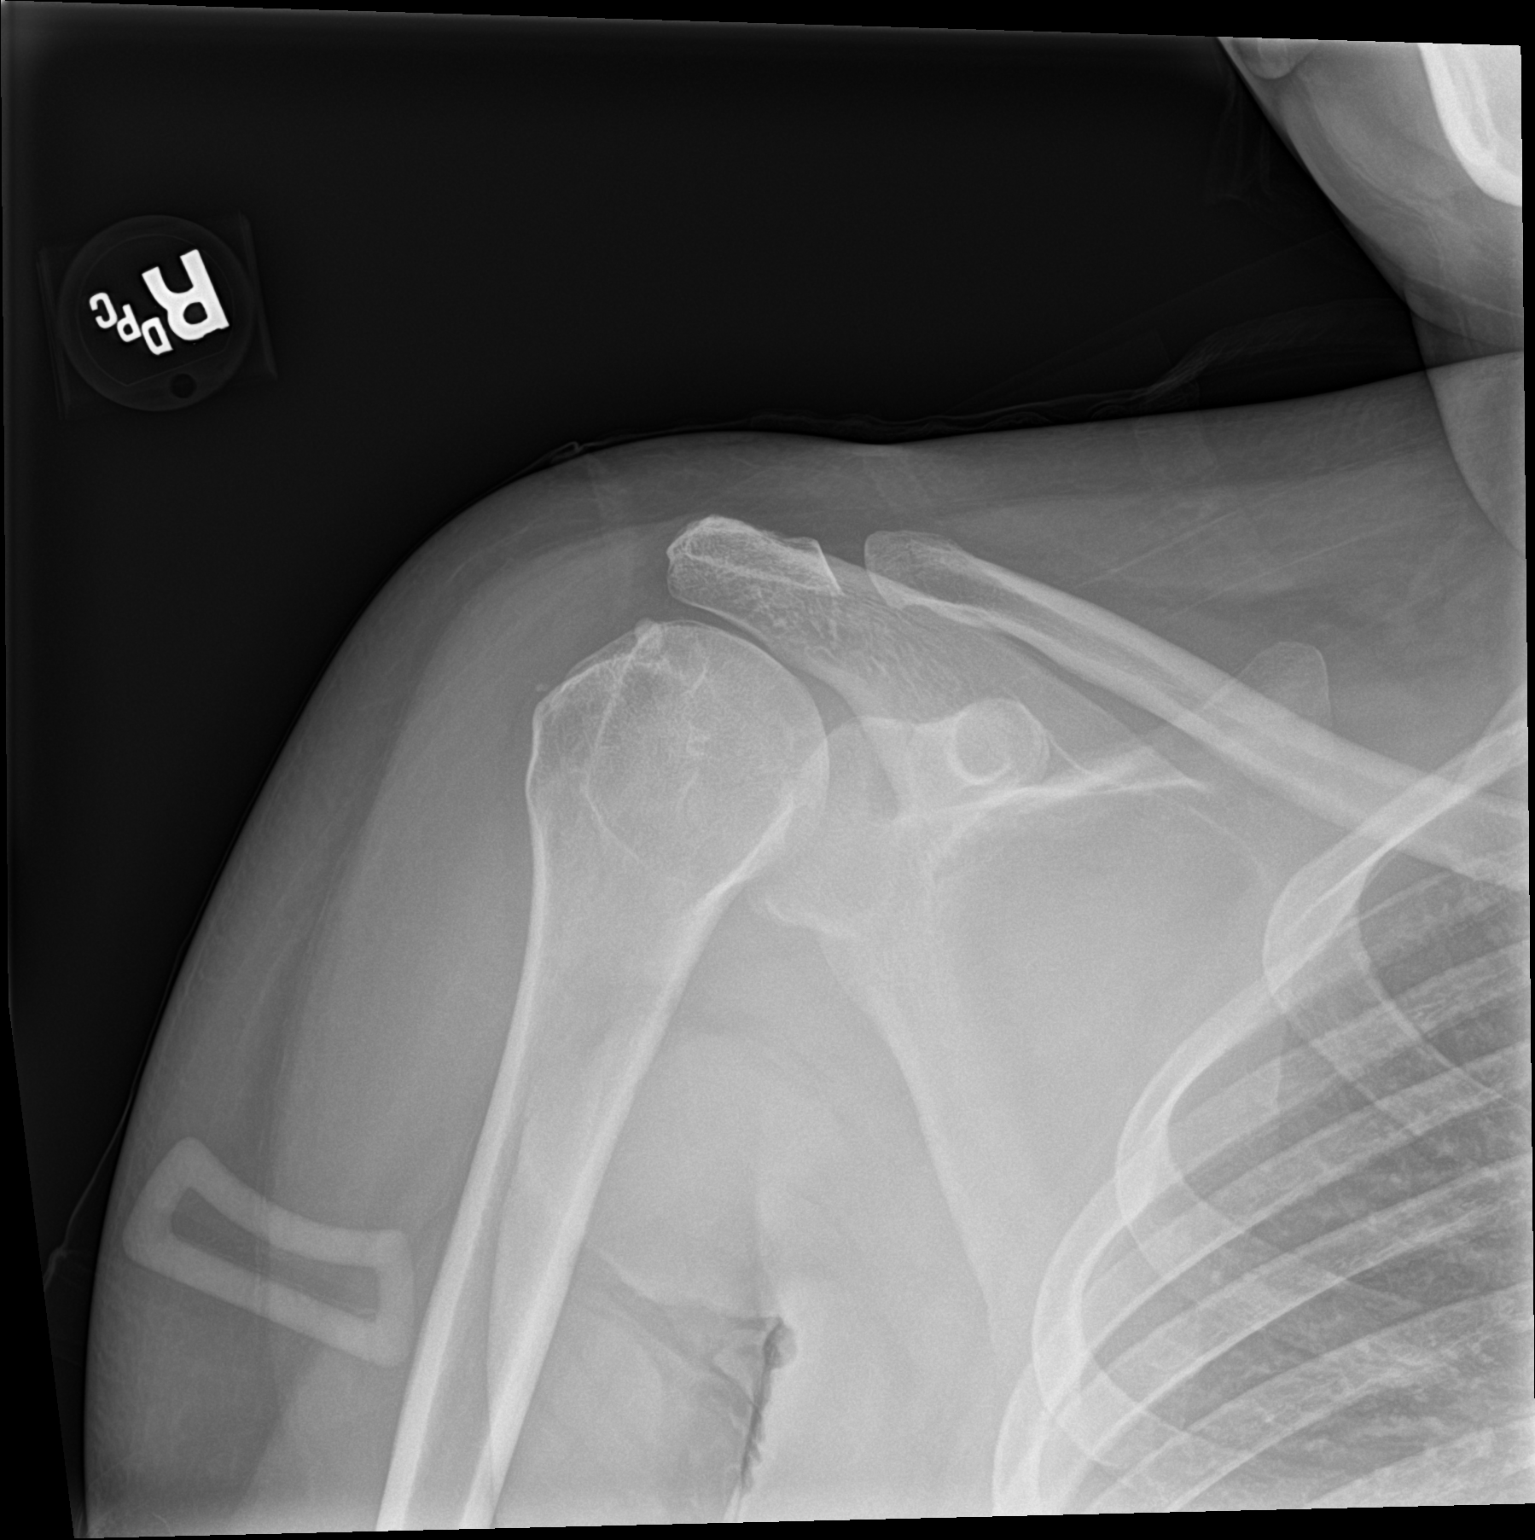

[shoulder obl]
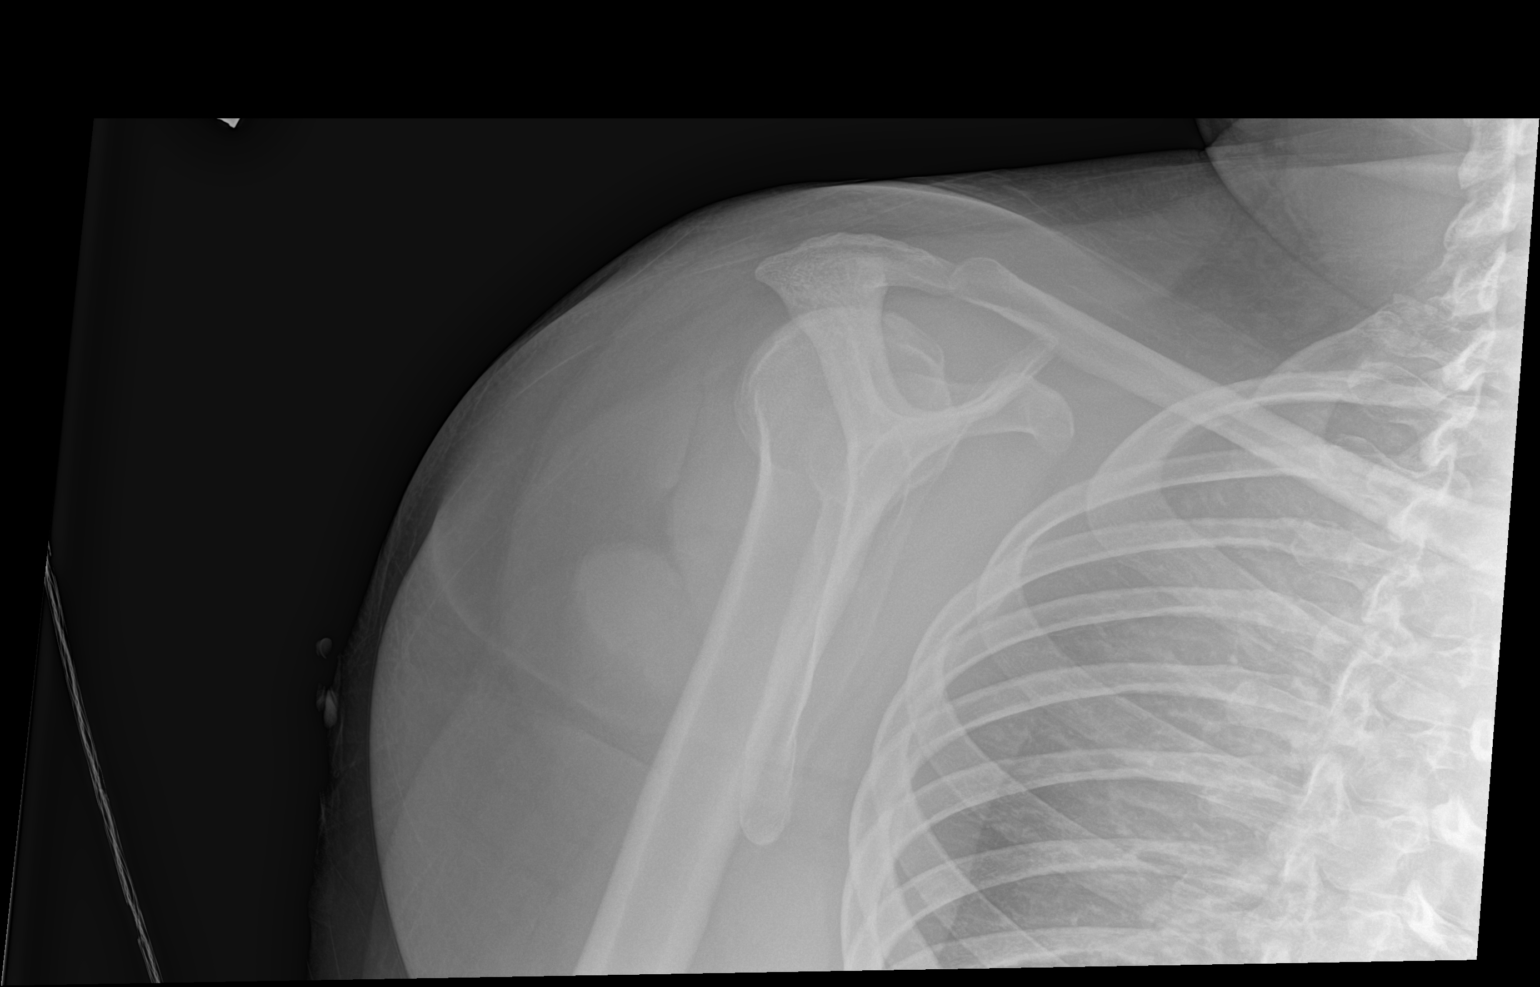

[2 of 2 positions shown; findings below may reference images not displayed]

FINDINGS: AP and transscapular Y-views were obtained 02/10/2021 [DATE] a.m.
successful anterior shoulder dislocation reduction is noted. The AC
joint is normal. 6 mm calcification projects about the greater
tuberosity and is chronic.

Question is raised of a mild Hill-Sachs impaction deformity of the
superolateral humeral head. There is no further evidence for
fractures. No glenoid fracture is seen.
IMPRESSION: 1. Successful dislocation reduction.
2. Question possible mild Hill-Sachs wedge impaction deformity of
the humeral head.

## 2022-08-13 IMAGING — DX DG SHOULDER 2+V*R*
2 series · 2 of 2 positions shown · non-contrast
Comparison: 09/02/2020

CLINICAL DATA: Shoulder injury with question dislocation

EXAM:
RIGHT SHOULDER - 2+ VIEW

[shoulder grashey]
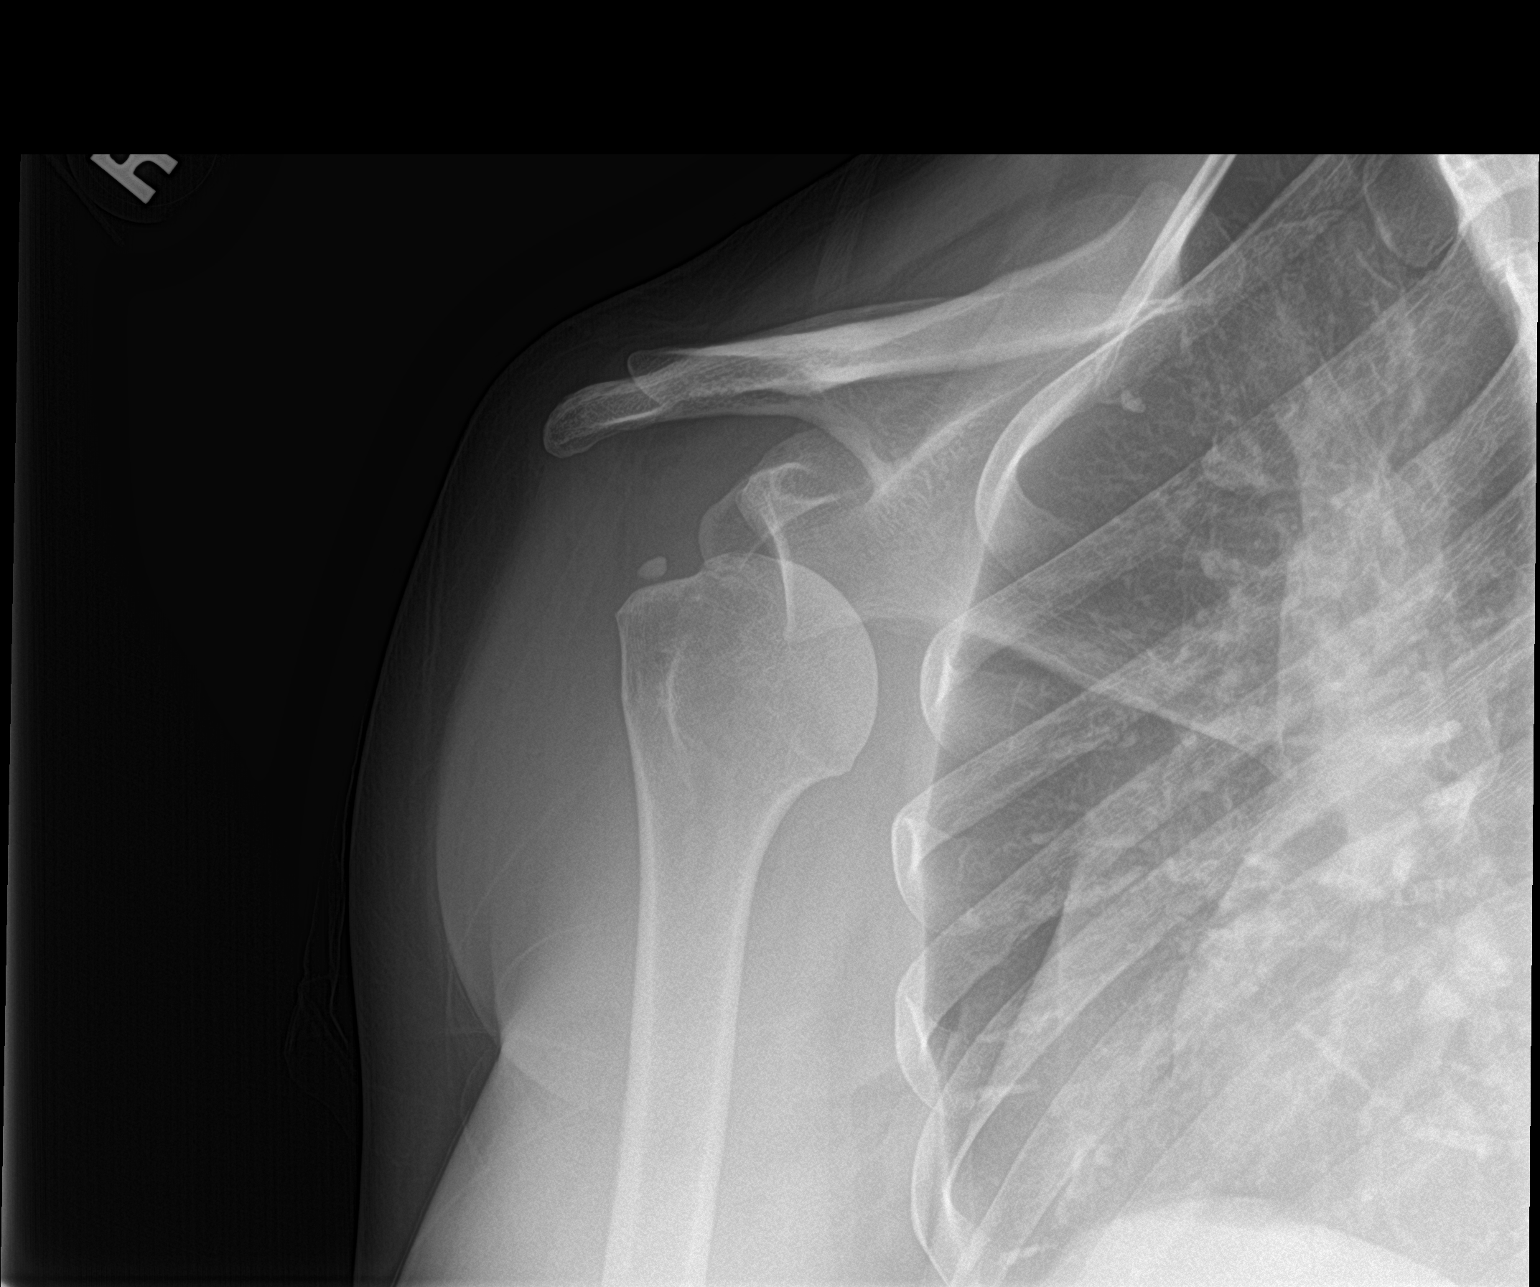

[shoulder y view]
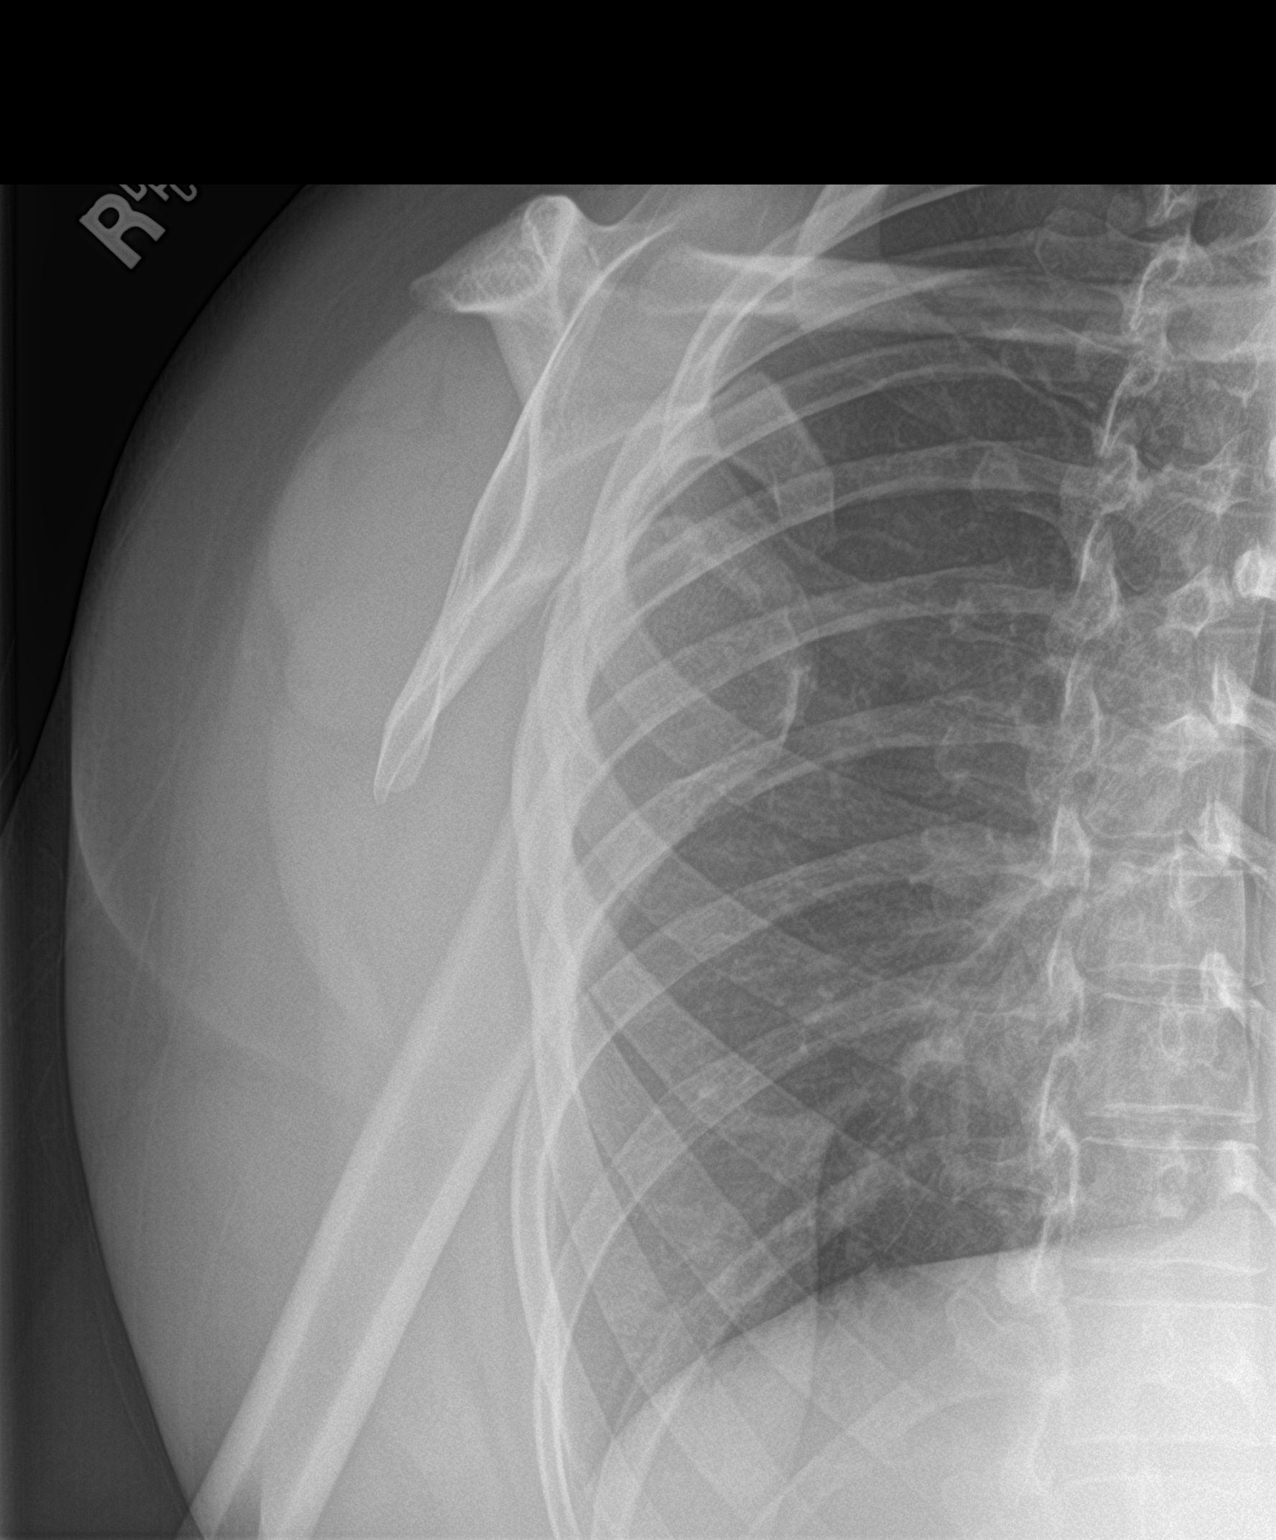

[2 of 2 positions shown; findings below may reference images not displayed]

FINDINGS: Anterior glenohumeral dislocation without acute fracture. An
ossified structure at the greater tuberosity is chronic when
compared to prior. Located AC joint.
IMPRESSION: Anterior glenohumeral dislocation.

## 2022-08-14 ENCOUNTER — Inpatient Hospital Stay: Payer: Medicaid Other | Attending: Hematology & Oncology

## 2022-08-14 VITALS — BP 110/64 | HR 68 | Temp 97.7°F | Resp 16

## 2022-08-14 DIAGNOSIS — D509 Iron deficiency anemia, unspecified: Secondary | ICD-10-CM

## 2022-08-14 DIAGNOSIS — D500 Iron deficiency anemia secondary to blood loss (chronic): Secondary | ICD-10-CM | POA: Insufficient documentation

## 2022-08-14 DIAGNOSIS — N920 Excessive and frequent menstruation with regular cycle: Secondary | ICD-10-CM | POA: Diagnosis not present

## 2022-08-14 MED ORDER — SODIUM CHLORIDE 0.9 % IV SOLN
300.0000 mg | Freq: Once | INTRAVENOUS | Status: AC
  Administered 2022-08-14: 300 mg via INTRAVENOUS
  Filled 2022-08-14: qty 300

## 2022-08-14 MED ORDER — SODIUM CHLORIDE 0.9 % IV SOLN: Freq: Once | INTRAVENOUS | Status: AC

## 2022-08-14 NOTE — Patient Instructions (Signed)

## 2022-08-21 ENCOUNTER — Inpatient Hospital Stay: Payer: Medicaid Other

## 2022-08-21 VITALS — BP 128/61 | HR 79 | Temp 97.9°F | Resp 16

## 2022-08-21 DIAGNOSIS — D509 Iron deficiency anemia, unspecified: Secondary | ICD-10-CM

## 2022-08-21 DIAGNOSIS — N920 Excessive and frequent menstruation with regular cycle: Secondary | ICD-10-CM | POA: Diagnosis not present

## 2022-08-21 DIAGNOSIS — D500 Iron deficiency anemia secondary to blood loss (chronic): Secondary | ICD-10-CM | POA: Diagnosis not present

## 2022-08-21 MED ORDER — SODIUM CHLORIDE 0.9 % IV SOLN: Freq: Once | INTRAVENOUS | Status: AC

## 2022-08-21 MED ORDER — SODIUM CHLORIDE 0.9 % IV SOLN
300.0000 mg | Freq: Once | INTRAVENOUS | Status: AC
  Administered 2022-08-21: 300 mg via INTRAVENOUS
  Filled 2022-08-21: qty 300

## 2022-08-21 NOTE — Patient Instructions (Signed)

## 2022-09-02 ENCOUNTER — Inpatient Hospital Stay: Payer: Medicaid Other | Attending: Hematology & Oncology

## 2022-09-23 ENCOUNTER — Other Ambulatory Visit: Payer: Self-pay | Admitting: Family

## 2022-09-23 ENCOUNTER — Inpatient Hospital Stay: Payer: Medicaid Other | Attending: Hematology & Oncology

## 2022-09-23 ENCOUNTER — Inpatient Hospital Stay: Payer: Medicaid Other | Admitting: Family

## 2022-09-23 DIAGNOSIS — D509 Iron deficiency anemia, unspecified: Secondary | ICD-10-CM

## 2022-10-17 ENCOUNTER — Ambulatory Visit: Payer: Medicaid Other

## 2022-10-17 ENCOUNTER — Other Ambulatory Visit: Payer: Self-pay

## 2022-10-17 ENCOUNTER — Ambulatory Visit
Admission: EM | Admit: 2022-10-17 | Discharge: 2022-10-17 | Disposition: A | Discharge status: Home / Self Care | Payer: Medicaid Other | Attending: Family Medicine | Admitting: Family Medicine

## 2022-10-17 DIAGNOSIS — R0789 Other chest pain: Secondary | ICD-10-CM | POA: Diagnosis not present

## 2022-10-17 NOTE — Discharge Instructions (Signed)
Your chest x-ray result is still pending, we will call if anything is abnormal with it once the radiologist finishes reading it.  No news means that everything looked good with the chest x-ray.  Follow-up with your primary care to recheck your symptoms if not resolving.  So far today everything is looking reassuring as far as how much we can evaluate in the setting.  If your symptoms significantly worsen at any time go to the emergency department for further evaluation.

## 2022-10-17 NOTE — ED Provider Notes (Signed)
[facility]    CSN: [id number] Arrival date & time: 10/17/22  1158      History   Chief Complaint No chief complaint on file.   HPI [name] is a 37 y.o. female.   Presenting today with about 2 weeks of chest tightness with deep breaths, slight burning sensation at times throughout.  Denies wheezing, significant cough, fever, chills, palpitations, dizziness, upper respiratory symptoms, recent heavy lifting.  So far trying Tylenol with mild temporary benefit.  No past history of chronic pulmonary disease or cardiac issues.    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/31/2013   recurrant    IBS (irritable bowel syndrome)    with constipation   Shoulder dislocation, recurrent     Patient Active Problem List   Diagnosis Date Noted   Pelvic pain 06/11/2022   Pregnancy examination or test, negative result 06/11/2022   Routine Papanicolaou smear 06/11/2022   Nasal congestion 06/11/2022   IDA (iron deficiency anemia) 07/01/2021   Depression 06/18/2021   Mild dysplasia of cervix (CIN I) 06/18/2021   Undiagnosed cardiac murmurs 06/18/2021   History of IBS 10/26/2019   Constipation 10/26/2019   Acute bilateral low back pain without sciatica 10/26/2019   History of gestational diabetes 11/02/2018   IBS (irritable bowel syndrome) 10/25/2015    Past Surgical History:  Procedure Laterality Date   DILATION AND CURETTAGE OF UTERUS  10/11/2017   incomplete abortion   DILATION AND EVACUATION N/A 10/11/2017   Procedure: suction dilation and evacuation;  Surgeon: Conan Bowens, MD;  Location: [facility];  Service: Gynecology;  Laterality: N/A;   SHOULDER ARTHROSCOPY WITH LABRAL REPAIR Right 03/21/2021   Procedure: SHOULDER ARTHROSCOPY WITH LABRAL REPAIR;  Surgeon: Oliver Barre, MD;  Location: [facility];  Service: Orthopedics;  Laterality: Right;   TUBAL LIGATION Bilateral 02/21/2017   pt  decided to not have this done   TUBAL LIGATION Bilateral 01/08/2019   Procedure: POST PARTUM TUBAL LIGATION;  Surgeon: Andrews Bing, MD;  Location: [facility];  Service: Gynecology;  Laterality: Bilateral;   WISDOM TOOTH EXTRACTION      OB History     Gravida  6   Para  5   Term  4   Preterm  1   AB  1   Living  6      SAB  1   IAB      Ectopic      Multiple  1   Live Births  6            Home Medications    Prior to Admission medications   Medication Sig Start Date End Date Taking? Authorizing Provider  acetaminophen (TYLENOL) 500 MG tablet Take 500 mg by mouth every 6 (six) hours as needed.    [provider]  fluconazole (DIFLUCAN) 150 MG tablet Take 1 now and 1 in 3 days 07/14/22   Adline Potter, NP  linaclotide [facility]) 72 MCG capsule Take 1 capsule (72 mcg total) by mouth daily before breakfast. 06/18/21   [employer], Alvino Chapel, FNP    Family History Family History  Problem Relation Age of Onset   Cancer Paternal Grandmother        breast   Diabetes Sister    Asthma Son    Diabetes Maternal Aunt    Cancer Paternal Aunt        cervical,  breast   Colon cancer Neg Hx     Social History Social History   Tobacco Use   Smoking status: Former    Current packs/day: 0.00    Types: Cigarettes    Quit date: 07/22/2004    Years since quitting: 18.2   Smokeless tobacco: Never  Vaping Use   Vaping status: Never Used  Substance Use Topics   Alcohol use: No    Comment: none since +UPT   Drug use: No     Allergies   Sulfa antibiotics   Review of Systems Review of Systems Per HPI  Physical Exam Triage Vital Signs ED Triage Vitals [10/17/22 1202]  Encounter Vitals Group     BP 131/87     Systolic BP Percentile      Diastolic BP Percentile      Pulse Rate 82     Resp 20     Temp 98.1 F (36.7 C)     Temp Source Oral     SpO2 98 %     Weight      Height      Head Circumference      Peak Flow      Pain Score 6      Pain Loc      Pain Education      Exclude from Growth Chart    No data found.  Updated Vital Signs BP 131/87 (BP Location: Right Arm)   Pulse 82   Temp 98.1 F (36.7 C) (Oral)   Resp 20   LMP 10/13/2022 Comment: start  SpO2 98%   Visual Acuity Right Eye Distance:   Left Eye Distance:   Bilateral Distance:    Right Eye Near:   Left Eye Near:    Bilateral Near:     Physical Exam Vitals and nursing note reviewed.  Constitutional:      Appearance: Normal appearance. She is not ill-appearing.  HENT:     Head: Atraumatic.     Mouth/Throat:     Mouth: Mucous membranes are moist.     Pharynx: Oropharynx is clear.  Eyes:     Extraocular Movements: Extraocular movements intact.     Conjunctiva/sclera: Conjunctivae normal.  Cardiovascular:     Rate and Rhythm: Normal rate and regular rhythm.     Heart sounds: Normal heart sounds.  Pulmonary:     Effort: Pulmonary effort is normal. No respiratory distress.     Breath sounds: Normal breath sounds. No wheezing or rales.  Chest:     Chest wall: No tenderness.  Musculoskeletal:        General: No tenderness. Normal range of motion.     Cervical back: Normal range of motion and neck supple.  Skin:    General: Skin is warm and dry.  Neurological:     Mental Status: She is alert and oriented to person, place, and time.     Motor: No weakness.     Gait: Gait normal.  Psychiatric:        Mood and Affect: Mood normal.        Thought Content: Thought content normal.        Judgment: Judgment normal.      UC Treatments / Results  Labs (all labs ordered are listed, but only abnormal results are displayed) Labs Reviewed - No data to display  EKG   Radiology DG Chest 2 View  Result Date: 10/17/2022 CLINICAL DATA:  Chest tightness for 2 weeks. EXAM: CHEST - 2 VIEW  COMPARISON:  02/25/2022 FINDINGS: The heart size and mediastinal contours are within normal limits. Both lungs are clear. The visualized skeletal structures are  unremarkable. IMPRESSION: No active cardiopulmonary disease. Electronically Signed   By: Danae Orleans M.D.   On: 10/17/2022 14:08    Procedures Procedures (including critical care time)  Medications Ordered in UC Medications - No data to display  Initial Impression / Assessment and Plan / UC Course  I have reviewed the triage vital signs and the nursing notes.  Pertinent labs & imaging results that were available during my care of the patient were reviewed by me and considered in my medical decision making (see chart for details).     Vitals and exam benign and reassuring with no obvious abnormalities, chest x-ray negative.  Unclear etiology of her symptoms, possibly musculoskeletal in nature or related to some reactive airway inflammation from seasonal changes.  Discussed continued monitoring, close PCP follow-up and return precautions  Final Clinical Impressions(s) / UC Diagnoses   Final diagnoses:  Chest tightness     Discharge Instructions      Your chest x-ray result is still pending, we will call if anything is abnormal with it once the radiologist finishes reading it.  No news means that everything looked good with the chest x-ray.  Follow-up with your primary care to recheck your symptoms if not resolving.  So far today everything is looking reassuring as far as how much we can evaluate in the setting.  If your symptoms significantly worsen at any time go to the emergency department for further evaluation.    ED Prescriptions   None    PDMP not reviewed this encounter.   Particia Nearing, [place] 10/17/22 [phone]

## 2022-10-17 NOTE — ED Triage Notes (Signed)
Pt reports she has burning in her chest, hurts to inhale, and SOB when walking x 2 weeks.

## 2022-10-31 ENCOUNTER — Other Ambulatory Visit (INDEPENDENT_AMBULATORY_CARE_PROVIDER_SITE_OTHER): Payer: Medicaid Other

## 2022-10-31 ENCOUNTER — Other Ambulatory Visit (HOSPITAL_COMMUNITY)
Admission: RE | Admit: 2022-10-31 | Discharge: 2022-10-31 | Disposition: A | Discharge status: Home / Self Care | Payer: Medicaid Other | Source: Ambulatory Visit | Attending: Obstetrics & Gynecology | Admitting: Obstetrics & Gynecology

## 2022-10-31 DIAGNOSIS — N898 Other specified noninflammatory disorders of vagina: Secondary | ICD-10-CM

## 2022-10-31 DIAGNOSIS — Z113 Encounter for screening for infections with a predominantly sexual mode of transmission: Secondary | ICD-10-CM | POA: Diagnosis not present

## 2022-10-31 NOTE — Progress Notes (Signed)
   NURSE VISIT- VAGINITIS/STD  SUBJECTIVE:  [name] is a 37 y.o. [phone] GYN patient female here for a vaginal swab for vaginitis screening.  She reports the following symptoms: discharge described as white with odor present. Denies abnormal vaginal bleeding, significant pelvic pain, fever, or UTI symptoms.  OBJECTIVE:  Appears well, in no apparent distress  ASSESSMENT: Vaginal swab for vaginitis screening  PLAN: Self-collected vaginal probe for Gonorrhea, Chlamydia, Trichomonas, Bacterial Vaginosis, Yeast sent to lab Treatment: to be determined once results are received Follow-up as needed if symptoms persist/worsen, or new symptoms develop  Caralyn Guile  10/31/2022 11:44 AM

## 2022-11-03 ENCOUNTER — Other Ambulatory Visit: Payer: Self-pay | Admitting: Obstetrics & Gynecology

## 2022-11-03 DIAGNOSIS — B9689 Other specified bacterial agents as the cause of diseases classified elsewhere: Secondary | ICD-10-CM

## 2022-11-03 LAB — CERVICOVAGINAL ANCILLARY ONLY
Bacterial Vaginitis (gardnerella): POSITIVE — AB
Candida Glabrata: NEGATIVE
Candida Vaginitis: NEGATIVE
Chlamydia: NEGATIVE
Comment: NEGATIVE
Comment: NEGATIVE
Comment: NEGATIVE
Comment: NEGATIVE
Comment: NEGATIVE
Comment: NORMAL
Neisseria Gonorrhea: NEGATIVE
Trichomonas: NEGATIVE

## 2022-11-03 MED ORDER — METRONIDAZOLE 500 MG PO TABS
500.0000 mg | ORAL_TABLET | Freq: Two times a day (BID) | ORAL | 0 refills | Status: AC
Start: 2022-11-03 — End: 2022-11-10

## 2022-11-03 NOTE — Progress Notes (Signed)
Rx for Metronidazole sent in due to BV  Myna Hidalgo, DO Attending Obstetrician & Gynecologist, [facility] for [employer], [facility]

## 2022-12-02 ENCOUNTER — Other Ambulatory Visit: Payer: Self-pay | Admitting: Adult Health

## 2022-12-02 MED ORDER — METRONIDAZOLE 500 MG PO TABS: 500.0000 mg | ORAL_TABLET | Freq: Two times a day (BID) | ORAL | 0 refills | Status: DC

## 2022-12-02 NOTE — Progress Notes (Signed)
Rx flagyl  

## 2022-12-26 ENCOUNTER — Other Ambulatory Visit: Payer: Self-pay | Admitting: Obstetrics & Gynecology

## 2022-12-26 MED ORDER — FLUCONAZOLE 150 MG PO TABS: ORAL_TABLET | ORAL | 3 refills | Status: DC

## 2022-12-26 NOTE — Progress Notes (Signed)
Rx for diflucan  

## 2023-01-05 ENCOUNTER — Ambulatory Visit
Admission: EM | Admit: 2023-01-05 | Discharge: 2023-01-05 | Disposition: A | Discharge status: Home / Self Care | Payer: Medicaid Other | Attending: Family Medicine | Admitting: Family Medicine

## 2023-01-05 DIAGNOSIS — J208 Acute bronchitis due to other specified organisms: Secondary | ICD-10-CM

## 2023-01-05 MED ORDER — PREDNISONE 20 MG PO TABS: 40.0000 mg | ORAL_TABLET | Freq: Every day | ORAL | 0 refills | Status: DC

## 2023-01-05 MED ORDER — PROMETHAZINE-DM 6.25-15 MG/5ML PO SYRP: 5.0000 mL | ORAL_SOLUTION | Freq: Four times a day (QID) | ORAL | 0 refills | Status: DC | PRN

## 2023-01-05 NOTE — ED Provider Notes (Signed)
[facility]    CSN: [id number] Arrival date & time: 01/05/23  0802      History   Chief Complaint Chief Complaint  Patient presents with   Cough    HPI [name] is a 37 y.o. female.   Patient presenting today with 1 week history of cough, congestion, sore throat that has now turned into a hacking cough and burning sensation in her chest.  Denies fever, chills, chest pain, significant shortness of breath, abdominal pain, nausea vomiting or diarrhea.  Taking TheraFlu and Robitussin with no relief.  No known history of chronic pulmonary disease, states had asthma as a child but has not had issues in adulthood with this.  States her entire family has been coughing.    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/31/2013   recurrant    IBS (irritable bowel syndrome)    with constipation   Shoulder dislocation, recurrent     Patient Active Problem List   Diagnosis Date Noted   Pelvic pain 06/11/2022   Pregnancy examination or test, negative result 06/11/2022   Routine Papanicolaou smear 06/11/2022   Nasal congestion 06/11/2022   IDA (iron deficiency anemia) 07/01/2021   Depression 06/18/2021   Mild dysplasia of cervix (CIN I) 06/18/2021   Undiagnosed cardiac murmurs 06/18/2021   History of IBS 10/26/2019   Constipation 10/26/2019   Acute bilateral low back pain without sciatica 10/26/2019   History of gestational diabetes 11/02/2018   IBS (irritable bowel syndrome) 10/25/2015    Past Surgical History:  Procedure Laterality Date   DILATION AND CURETTAGE OF UTERUS  10/11/2017   incomplete abortion   DILATION AND EVACUATION N/A 10/11/2017   Procedure: suction dilation and evacuation;  Surgeon: Conan Bowens, MD;  Location: [facility];  Service: Gynecology;  Laterality: N/A;   SHOULDER ARTHROSCOPY WITH LABRAL REPAIR Right 03/21/2021   Procedure: SHOULDER ARTHROSCOPY WITH LABRAL  REPAIR;  Surgeon: Oliver Barre, MD;  Location: [facility];  Service: Orthopedics;  Laterality: Right;   TUBAL LIGATION Bilateral 02/21/2017   pt decided to not have this done   TUBAL LIGATION Bilateral 01/08/2019   Procedure: POST PARTUM TUBAL LIGATION;  Surgeon:  Bing, MD;  Location: [facility];  Service: Gynecology;  Laterality: Bilateral;   WISDOM TOOTH EXTRACTION      OB History     Gravida  6   Para  5   Term  4   Preterm  1   AB  1   Living  6      SAB  1   IAB      Ectopic      Multiple  1   Live Births  6            Home Medications    Prior to Admission medications   Medication Sig Start Date End Date Taking? Authorizing Provider  fluconazole (DIFLUCAN) 150 MG tablet Take one tablet once then repeat in 2 days 12/26/22  Yes Ozan, Victorino Dike, DO  metroNIDAZOLE (FLAGYL) 500 MG tablet Take 1 tablet (500 mg total) by mouth 2 (two) times daily. 12/02/22  Yes Adline Potter, NP  predniSONE (DELTASONE) 20 MG tablet Take 2 tablets (40 mg total) by mouth daily with breakfast. 01/05/23  Yes Particia Nearing, PA-C  promethazine-dextromethorphan (PROMETHAZINE-DM) 6.25-15 MG/5ML syrup Take 5 mLs by mouth 4 (four) times daily as needed. 01/05/23  Yes  Particia Nearing, PA-C  linaclotide [facility]) 72 MCG capsule Take 1 capsule (72 mcg total) by mouth daily before breakfast. 06/18/21   [employer], Alvino Chapel, FNP    Family History Family History  Problem Relation Age of Onset   Cancer Paternal Grandmother        breast   Diabetes Sister    Asthma Son    Diabetes Maternal Aunt    Cancer Paternal Aunt        cervical, breast   Colon cancer Neg Hx     Social History Social History   Tobacco Use   Smoking status: Former    Current packs/day: 0.00    Types: Cigarettes    Quit date: 07/22/2004    Years since quitting: 18.4   Smokeless tobacco: Never  Vaping Use   Vaping status: Never Used  Substance Use Topics   Alcohol use: No    Comment:  none since +UPT   Drug use: No     Allergies   Sulfa antibiotics   Review of Systems Review of Systems Per HPI  Physical Exam Triage Vital Signs ED Triage Vitals  Encounter Vitals Group     BP 01/05/23 0818 124/75     Systolic BP Percentile --      Diastolic BP Percentile --      Pulse Rate 01/05/23 0818 79     Resp 01/05/23 0818 16     Temp 01/05/23 0818 98.3 F (36.8 C)     Temp Source 01/05/23 0818 Oral     SpO2 01/05/23 0818 99 %     Weight --      Height --      Head Circumference --      Peak Flow --      Pain Score 01/05/23 0819 0     Pain Loc --      Pain Education --      Exclude from Growth Chart --    No data found.  Updated Vital Signs BP 124/75 (BP Location: Right Arm)   Pulse 79   Temp 98.3 F (36.8 C) (Oral)   Resp 16   LMP 01/04/2023 (Exact Date)   SpO2 99%   Visual Acuity Right Eye Distance:   Left Eye Distance:   Bilateral Distance:    Right Eye Near:   Left Eye Near:    Bilateral Near:     Physical Exam Vitals and nursing note reviewed.  Constitutional:      Appearance: Normal appearance.  HENT:     Head: Atraumatic.     Right Ear: Tympanic membrane and external ear normal.     Left Ear: Tympanic membrane and external ear normal.     Nose: Rhinorrhea present.     Mouth/Throat:     Mouth: Mucous membranes are moist.     Pharynx: No posterior oropharyngeal erythema.  Eyes:     Extraocular Movements: Extraocular movements intact.     Conjunctiva/sclera: Conjunctivae normal.  Cardiovascular:     Rate and Rhythm: Normal rate and regular rhythm.     Heart sounds: Normal heart sounds.  Pulmonary:     Effort: Pulmonary effort is normal.     Breath sounds: Normal breath sounds. No wheezing or rales.  Musculoskeletal:        General: Normal range of motion.     Cervical back: Normal range of motion and neck supple.  Skin:    General: Skin is warm and dry.  Neurological:  Mental Status: She is alert and oriented to person,  place, and time.  Psychiatric:        Mood and Affect: Mood normal.        Thought Content: Thought content normal.      UC Treatments / Results  Labs (all labs ordered are listed, but only abnormal results are displayed) Labs Reviewed - No data to display  EKG   Radiology No results found.  Procedures Procedures (including critical care time)  Medications Ordered in UC Medications - No data to display  Initial Impression / Assessment and Plan / UC Course  I have reviewed the triage vital signs and the nursing notes.  Pertinent labs & imaging results that were available during my care of the patient were reviewed by me and considered in my medical decision making (see chart for details).     Vitals and exam reassuring today, suspect viral bronchitis.  Treat with prednisone, Phenergan DM, supportive over-the-counter medications and home care.  Return for worsening symptoms.  Viral testing declined with shared decision making today.  Final Clinical Impressions(s) / UC Diagnoses   Final diagnoses:  Viral bronchitis   Discharge Instructions   None    ED Prescriptions     Medication Sig Dispense Auth. Provider   predniSONE (DELTASONE) 20 MG tablet Take 2 tablets (40 mg total) by mouth daily with breakfast. 10 tablet Particia Nearing, PA-C   promethazine-dextromethorphan (PROMETHAZINE-DM) 6.25-15 MG/5ML syrup Take 5 mLs by mouth 4 (four) times daily as needed. 100 mL Particia Nearing, [place]      PDMP not reviewed this encounter.   Roosvelt Maser [place], [place] 01/05/23 [phone]

## 2023-01-05 NOTE — ED Triage Notes (Signed)
Cough, headache x 1 week. Taking robitussin and Theraflu with no relief of symptoms.

## 2023-02-16 ENCOUNTER — Other Ambulatory Visit: Payer: Medicaid Other

## 2023-03-04 ENCOUNTER — Ambulatory Visit
Admission: EM | Admit: 2023-03-04 | Discharge: 2023-03-04 | Disposition: A | Discharge status: Home / Self Care | Payer: Medicaid Other | Attending: Nurse Practitioner | Admitting: Nurse Practitioner

## 2023-03-04 ENCOUNTER — Other Ambulatory Visit: Payer: Self-pay

## 2023-03-04 ENCOUNTER — Encounter: Payer: Self-pay | Admitting: Emergency Medicine

## 2023-03-04 ENCOUNTER — Ambulatory Visit (INDEPENDENT_AMBULATORY_CARE_PROVIDER_SITE_OTHER): Payer: Medicaid Other

## 2023-03-04 DIAGNOSIS — R0789 Other chest pain: Secondary | ICD-10-CM

## 2023-03-04 DIAGNOSIS — R0781 Pleurodynia: Secondary | ICD-10-CM | POA: Diagnosis not present

## 2023-03-04 DIAGNOSIS — S29011A Strain of muscle and tendon of front wall of thorax, initial encounter: Secondary | ICD-10-CM | POA: Diagnosis not present

## 2023-03-04 MED ORDER — METHOCARBAMOL 500 MG PO TABS: 500.0000 mg | ORAL_TABLET | Freq: Two times a day (BID) | ORAL | 0 refills | Status: DC

## 2023-03-04 MED ORDER — DEXAMETHASONE SODIUM PHOSPHATE 10 MG/ML IJ SOLN
10.0000 mg | INTRAMUSCULAR | Status: AC
  Administered 2023-03-04: 10 mg via INTRAMUSCULAR

## 2023-03-04 MED ORDER — KETOROLAC TROMETHAMINE 30 MG/ML IJ SOLN
30.0000 mg | Freq: Once | INTRAMUSCULAR | Status: AC
  Administered 2023-03-04: 30 mg via INTRAMUSCULAR

## 2023-03-04 MED ORDER — PREDNISONE 20 MG PO TABS: 40.0000 mg | ORAL_TABLET | Freq: Every day | ORAL | 0 refills | Status: AC

## 2023-03-04 NOTE — ED Provider Notes (Signed)
 [facility]    CSN: [id number] Arrival date & time: 03/04/23  9046      History   Chief Complaint Chief Complaint  Patient presents with   Muscle Pain    HPI [name] is a 38 y.o. female.   The history is provided by the patient.   Patient presents for complaints of left-sided chest pain that started over the past week.  Patient states that chest pain worsens with movement, deep breathing, coughing, and when she raises her left arm.  Patient states that prior to her symptoms starting, her 59-year-old daughter jumped off the bed and she tried to catch her.  She states when she is sitting still, she does not have much pain.  Pain starts at the middle of the chest, and radiates over to the left side of the chest and into the left back.  She denies shortness of breath, difficulty breathing, diaphoresis, nausea, vomiting, bruising, redness, or swelling.  Reports that she did take Tylenol  with minimal relief of symptoms.  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/31/2013   recurrant    IBS (irritable bowel syndrome)    with constipation   Shoulder dislocation, recurrent     Patient Active Problem List   Diagnosis Date Noted   Pelvic pain 06/11/2022   Pregnancy examination or test, negative result 06/11/2022   Routine Papanicolaou smear 06/11/2022   Nasal congestion 06/11/2022   IDA (iron  deficiency anemia) 07/01/2021   Depression 06/18/2021   Mild dysplasia of cervix (CIN I) 06/18/2021   Undiagnosed cardiac murmurs 06/18/2021   History of IBS 10/26/2019   Constipation 10/26/2019   Acute bilateral low back pain without sciatica 10/26/2019   History of gestational diabetes 11/02/2018   IBS (irritable bowel syndrome) 10/25/2015    Past Surgical History:  Procedure Laterality Date   DILATION AND CURETTAGE OF UTERUS  10/11/2017   incomplete abortion   DILATION AND EVACUATION N/A  10/11/2017   Procedure: suction dilation and evacuation;  Surgeon: Nicholaus Burnard HERO, MD;  Location: [facility];  Service: Gynecology;  Laterality: N/A;   SHOULDER ARTHROSCOPY WITH LABRAL REPAIR Right 03/21/2021   Procedure: SHOULDER ARTHROSCOPY WITH LABRAL REPAIR;  Surgeon: Onesimo Oneil LABOR, MD;  Location: [facility];  Service: Orthopedics;  Laterality: Right;   TUBAL LIGATION Bilateral 02/21/2017   pt decided to not have this done   TUBAL LIGATION Bilateral 01/08/2019   Procedure: POST PARTUM TUBAL LIGATION;  Surgeon: Izell Harari, MD;  Location: [facility];  Service: Gynecology;  Laterality: Bilateral;   WISDOM TOOTH EXTRACTION      OB History     Gravida  6   Para  5   Term  4   Preterm  1   AB  1   Living  6      SAB  1   IAB      Ectopic      Multiple  1   Live Births  6            Home Medications    Prior to Admission medications   Medication Sig Start Date End Date Taking? Authorizing Provider  methocarbamol  (ROBAXIN ) 500 MG tablet Take 1 tablet (500 mg total) by mouth 2 (two) times daily. 03/04/23  Yes Leath-Warren, Etta PARAS, NP  predniSONE  (DELTASONE ) 20 MG tablet Take 2 tablets (40 mg total) by mouth daily with breakfast for  5 days. 03/04/23 03/09/23 Yes Leath-Warren, Etta PARAS, NP  fluconazole  (DIFLUCAN ) 150 MG tablet Take one tablet once then repeat in 2 days 12/26/22   Ozan, Jennifer, DO  linaclotide  (LINZESS ) 72 MCG capsule Take 1 capsule (72 mcg total) by mouth daily before breakfast. 06/18/21   Ameduite, Leonna S, FNP  metroNIDAZOLE  (FLAGYL ) 500 MG tablet Take 1 tablet (500 mg total) by mouth 2 (two) times daily. 12/02/22   Signa Delon LABOR, NP  promethazine -dextromethorphan (PROMETHAZINE -DM) 6.25-15 MG/5ML syrup Take 5 mLs by mouth 4 (four) times daily as needed. 01/05/23   Stuart Vernell Norris, PA-C    Family History Family History  Problem Relation Age of Onset   Cancer Paternal Grandmother        breast   Diabetes Sister    Asthma Son     Diabetes Maternal Aunt    Cancer Paternal Aunt        cervical, breast   Colon cancer Neg Hx     Social History Social History   Tobacco Use   Smoking status: Former    Current packs/day: 0.00    Types: Cigarettes    Quit date: 07/22/2004    Years since quitting: 18.6   Smokeless tobacco: Never  Vaping Use   Vaping status: Never Used  Substance Use Topics   Alcohol use: No    Comment: none since +UPT   Drug use: No     Allergies   Sulfa antibiotics   Review of Systems Review of Systems Per HPI  Physical Exam Triage Vital Signs ED Triage Vitals  Encounter Vitals Group     BP 03/04/23 1057 118/62     Systolic BP Percentile --      Diastolic BP Percentile --      Pulse Rate 03/04/23 1057 65     Resp 03/04/23 1057 18     Temp 03/04/23 1057 (!) 97.5 F (36.4 C)     Temp Source 03/04/23 1057 Oral     SpO2 03/04/23 1057 98 %     Weight --      Height --      Head Circumference --      Peak Flow --      Pain Score 03/04/23 1056 7     Pain Loc --      Pain Education --      Exclude from Growth Chart --    No data found.  Updated Vital Signs BP 118/62 (BP Location: Right Arm)   Pulse 65   Temp (!) 97.5 F (36.4 C) (Oral)   Resp 18   LMP 02/26/2023 (Approximate)   SpO2 98%   Visual Acuity Right Eye Distance:   Left Eye Distance:   Bilateral Distance:    Right Eye Near:   Left Eye Near:    Bilateral Near:     Physical Exam Vitals and nursing note reviewed.  Constitutional:      General: She is not in acute distress.    Appearance: Normal appearance.  HENT:     Head: Normocephalic.  Eyes:     Extraocular Movements: Extraocular movements intact.     Conjunctiva/sclera: Conjunctivae normal.     Pupils: Pupils are equal, round, and reactive to light.  Cardiovascular:     Rate and Rhythm: Normal rate and regular rhythm.     Pulses: Normal pulses.     Heart sounds: Normal heart sounds.  Pulmonary:     Effort: Pulmonary effort is normal. No  respiratory distress.  Breath sounds: Normal breath sounds. No stridor. No wheezing, rhonchi or rales.  Chest:     Chest wall: Tenderness present. No mass, lacerations, deformity or swelling.    Abdominal:     General: Bowel sounds are normal.     Palpations: Abdomen is soft.  Musculoskeletal:     Cervical back: Normal range of motion.  Skin:    General: Skin is warm and dry.  Neurological:     General: No focal deficit present.     Mental Status: She is alert and oriented to person, place, and time.  Psychiatric:        Mood and Affect: Mood normal.        Behavior: Behavior normal.      UC Treatments / Results  Labs (all labs ordered are listed, but only abnormal results are displayed) Labs Reviewed - No data to display  EKG: Normal sinus rhythm, no ectopy, no STEMI.  Compared to EKGs performed on 10/17/2022 and 06/18/2021.   Radiology DG Chest 2 View Result Date: 03/04/2023 CLINICAL DATA:  Left-sided pleuritic chest pain and chest wall tenderness for 1 week. EXAM: CHEST - 2 VIEW COMPARISON:  10/17/2022 FINDINGS: The heart size and mediastinal contours are within normal limits. Both lungs are clear. The visualized skeletal structures are unremarkable. IMPRESSION: No active cardiopulmonary disease. Electronically Signed   By: Norleen DELENA Kil M.D.   On: 03/04/2023 12:10    Procedures Procedures (including critical care time)  Medications Ordered in UC Medications  dexamethasone  (DECADRON ) injection 10 mg (10 mg Intramuscular Given 03/04/23 1224)  ketorolac  (TORADOL ) 30 MG/ML injection 30 mg (30 mg Intramuscular Given 03/04/23 1224)    Initial Impression / Assessment and Plan / UC Course  I have reviewed the triage vital signs and the nursing notes.  Pertinent labs & imaging results that were available during my care of the patient were reviewed by me and considered in my medical decision making (see chart for details).  Chest x-ray is negative for active cardiopulmonary  disease.  EKG shows normal sinus rhythm, no ectopy, no STEMI.  Suspect patient may be experiencing a muscle strain of the left chest wall.  Decadron  10 mg IM administered, along with Toradol  30 mg IM.  Prednisone  40 mg prescribed for the next 5 days, and methocarbamol  500 mg.  Supportive care recommendations were provided and discussed with the patient to include over-the-counter analgesics, warm compresses to the affected area, deep breathing and coughing regularly.  Discussed ER follow-up precautions with the patient.  Patient was in agreement with this plan of care and verbalized understanding.  All questions were answered.  Patient stable for discharge.  Final Clinical Impressions(s) / UC Diagnoses   Final diagnoses:  Chest wall pain  Chest wall muscle strain, initial encounter     Discharge Instructions      The chest x-ray was negative. You were given injections of Toradol  30 mg and Decadron  10 mg today.  Do not take any additional NSAIDs to include ibuprofen , Advil , Aleve , Motrin , or naproxen .  You may take over-the-counter Tylenol  for breakthrough pain or discomfort. Apply warm compresses to the affected area as needed for pain or discomfort. No heavy lifting greater than 5 to 10 pounds while symptoms persist. Gentle range of motion exercises of the upper extremities to help decrease recovery time. Make sure you are coughing and deep breathing regularly to prevent development of pneumonia. If symptoms do not improve with this treatment, please follow-up with your primary care physician for  further evaluation. Follow-up as needed.     ED Prescriptions     Medication Sig Dispense Auth. Provider   predniSONE  (DELTASONE ) 20 MG tablet Take 2 tablets (40 mg total) by mouth daily with breakfast for 5 days. 10 tablet Leath-Warren, Etta PARAS, NP   methocarbamol  (ROBAXIN ) 500 MG tablet Take 1 tablet (500 mg total) by mouth 2 (two) times daily. 20 tablet Leath-Warren, Etta PARAS, NP       PDMP not reviewed this encounter.   Gilmer Etta PARAS, NP 03/04/23 1240

## 2023-03-04 NOTE — ED Triage Notes (Signed)
 Pt reports pain with movement in chest and arm x1 week. Pt denies any known injury. Reports shortness of breath with bending over, reports "muscle ache" is going in my back now.

## 2023-03-04 NOTE — Discharge Instructions (Addendum)
 The chest x-ray was negative. You were given injections of Toradol  30 mg and Decadron  10 mg today.  Do not take any additional NSAIDs to include ibuprofen , Advil , Aleve , Motrin , or naproxen .  You may take over-the-counter Tylenol  for breakthrough pain or discomfort. Apply warm compresses to the affected area as needed for pain or discomfort. No heavy lifting greater than 5 to 10 pounds while symptoms persist. Gentle range of motion exercises of the upper extremities to help decrease recovery time. Make sure you are coughing and deep breathing regularly to prevent development of pneumonia. If symptoms do not improve with this treatment, please follow-up with your primary care physician for further evaluation. Follow-up as needed.

## 2023-05-15 ENCOUNTER — Encounter: Payer: Self-pay | Admitting: Orthopedic Surgery

## 2023-05-15 ENCOUNTER — Ambulatory Visit: Admitting: Orthopedic Surgery

## 2023-05-15 ENCOUNTER — Other Ambulatory Visit (INDEPENDENT_AMBULATORY_CARE_PROVIDER_SITE_OTHER): Payer: Self-pay

## 2023-05-15 VITALS — BP 144/83 | HR 82 | Ht 64.0 in | Wt 194.0 lb

## 2023-05-15 DIAGNOSIS — M25512 Pain in left shoulder: Secondary | ICD-10-CM

## 2023-05-15 DIAGNOSIS — R202 Paresthesia of skin: Secondary | ICD-10-CM | POA: Diagnosis not present

## 2023-05-15 DIAGNOSIS — M25511 Pain in right shoulder: Secondary | ICD-10-CM

## 2023-05-15 MED ORDER — CYCLOBENZAPRINE HCL 10 MG PO TABS: 10.0000 mg | ORAL_TABLET | Freq: Two times a day (BID) | ORAL | 0 refills | Status: DC | PRN

## 2023-05-15 MED ORDER — PREDNISONE 10 MG (21) PO TBPK: ORAL_TABLET | ORAL | 0 refills | Status: DC

## 2023-05-15 NOTE — Progress Notes (Signed)
 Orthopaedic Clinic Return  Assessment: [name] is a 38 y.o. female with the following: Bilateral shoulder pain, trapezius spasm Paresthesias . Plan: [name] has some pain in the bilateral shoulders, and some numbness and tingling in both hands.  No specific injury.  This is been ongoing for about 3 weeks.  On physical exam, she has good strength.  She does have some spasm within the trapezius muscles bilaterally.  Positive Tinel's and positive Phalen's in bilateral wrist.  This may be combination of issues.  I think she has some nerve irritation, as well as muscle spasm contributing to her pain.  Short course of prednisone, as well as Flexeril.  If she continues to have issues, she will contact the clinic.  Overall, she has excellent shoulder range of motion.  Low concern for shoulder injury at this time.  Meds ordered this encounter  Medications   predniSONE (STERAPRED UNI-PAK 21 TAB) 10 MG (21) TBPK tablet    Sig: 10 mg DS 12 as directed    Dispense:  48 tablet    Refill:  0   cyclobenzaprine (FLEXERIL) 10 MG tablet    Sig: Take 1 tablet (10 mg total) by mouth 2 (two) times daily as needed.    Dispense:  20 tablet    Refill:  0     Follow-up: Return if symptoms worsen or fail to improve.   Subjective:  Chief Complaint  Patient presents with   Shoulder Pain    Bilat aching and pain down the arms     History of Present Illness: [name] is a 38 y.o. female who returns to clinic for repeat evaluation of bilateral shoulder pain.  She states that she said pain in the superior aspect of bilateral shoulders for about 3 weeks.  In addition, she has some numbness and tingling in both hands.  No specific injury.  Medications have not been helpful.  She has some pain in her neck.  Slightly restricted range of motion.  Occasionally, she has some aching pains in the right shoulder.  Numbness and tingling gets worse in both hands at night.  Review of Systems: No fevers or  chills + numbness or tingling No chest pain No shortness of breath No bowel or bladder dysfunction No GI distress No headaches   Objective: BP (!) 144/83   Pulse 82   Ht 5\' 4"  (1.626 m)   Wt 194 lb (88 kg)   BMI 33.30 kg/m   Physical Exam:  Alert and oriented.  No acute distress.  Normal muscle bulk bilateral shoulders.  Mild tenderness palpation within the trapezius muscles bilaterally.  Positive Tinel's bilateral wrist.  Positive Phalen's.  Full range of motion of both shoulders without pain.  Excellent upper body strength bilaterally.  IMAGING: I personally ordered and reviewed the following images:  X-rays of the cervical spine were obtained in clinic today.  No acute injuries noted.  No anterolisthesis.  Well-maintained disc height.  Slight loss of normal curvature.  No bony lesions.  Impression: Negative cervical spine x-rays   Oliver Barre, MD 05/15/2023 9:33 AM

## 2023-10-09 ENCOUNTER — Ambulatory Visit: Payer: Self-pay | Admitting: Orthopedic Surgery

## 2023-10-10 MED ORDER — PREDNISONE 10 MG (21) PO TBPK: ORAL_TABLET | ORAL | 0 refills | Status: DC

## 2023-11-04 ENCOUNTER — Ambulatory Visit: Admitting: Orthopedic Surgery

## 2024-01-12 ENCOUNTER — Ambulatory Visit
Admission: EM | Admit: 2024-01-12 | Discharge: 2024-01-12 | Disposition: A | Discharge status: Home / Self Care | Attending: Nurse Practitioner | Admitting: Nurse Practitioner

## 2024-01-12 ENCOUNTER — Encounter: Payer: Self-pay | Admitting: Emergency Medicine

## 2024-01-12 DIAGNOSIS — R0789 Other chest pain: Secondary | ICD-10-CM

## 2024-01-12 DIAGNOSIS — N61 Mastitis without abscess: Secondary | ICD-10-CM

## 2024-01-12 MED ORDER — MUPIROCIN 2 % EX OINT: 1.0000 | TOPICAL_OINTMENT | Freq: Two times a day (BID) | CUTANEOUS | 0 refills | Status: AC

## 2024-01-12 MED ORDER — TIZANIDINE HCL 4 MG PO TABS: 4.0000 mg | ORAL_TABLET | Freq: Three times a day (TID) | ORAL | 0 refills | Status: AC | PRN

## 2024-01-12 NOTE — ED Triage Notes (Signed)
 Feels sore in chest when inhaling x 3 days.  States she has a bump on right breast x 1 week.  States area is painful

## 2024-01-12 NOTE — Discharge Instructions (Signed)
 For your chest discomfort: Take medication as prescribed. You may continue over-the-counter Tylenol  or ibuprofen  as needed for pain, fever, or general discomfort. Recommend the use of warm compresses to the affected area to help with pain or discomfort. Continue to monitor your symptoms for worsening.  If you develop difficulty breathing, shortness of breath, or other concerns, please follow-up in the emergency department for further evaluation.  For the skin abscess: Apply medication as prescribed. You may take over-the-counter Tylenol  or ibuprofen  as needed for pain or discomfort. Apply warm compresses to the affected area at least 3-4 times daily while symptoms persist. If the area begins to drain, recommend keeping the area covered. Continue to monitor for worsening symptoms.  Seek care if you develop increased redness, swelling, fever, chills, foul-smelling drainage from the site, or other concerns. Follow-up as needed.

## 2024-01-12 NOTE — ED Provider Notes (Signed)
 [facility]    CSN: [id number] Arrival date & time: 01/12/24  1530      History   Chief Complaint No chief complaint on file.   HPI [name] is a 38 y.o. female.   The history is provided by the patient.   Patient presents for complaints of pain in the chest with inhaling for the past 3 days.  She denies fever, chills, injury, trauma, shortness of breath, difficulty breathing, wheezing, bruising, or swelling.  States that she has been taking Tylenol  for her symptoms with minimal relief.  She states that she has soreness that extends from the chest to her axilla.  So far, states she has been taking Tylenol  for her symptoms.  Patient also complains of a bump on the right breast that has been present for the past week.  She states the area is red and tender to touch.  She denies fever, chills, oozing, or drainage.  States that she is concerned that she may have an ingrown hair that may be causing her symptoms.  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/31/2013   recurrant    IBS (irritable bowel syndrome)    with constipation   Shoulder dislocation, recurrent     Patient Active Problem List   Diagnosis Date Noted   Pelvic pain 06/11/2022   Pregnancy examination or test, negative result 06/11/2022   Routine Papanicolaou smear 06/11/2022   Nasal congestion 06/11/2022   IDA (iron  deficiency anemia) 07/01/2021   Depression 06/18/2021   Mild dysplasia of cervix (CIN I) 06/18/2021   Undiagnosed cardiac murmurs 06/18/2021   History of IBS 10/26/2019   Constipation 10/26/2019   Acute bilateral low back pain without sciatica 10/26/2019   History of gestational diabetes 11/02/2018   IBS (irritable bowel syndrome) 10/25/2015    Past Surgical History:  Procedure Laterality Date   DILATION AND CURETTAGE OF UTERUS  10/11/2017   incomplete abortion   DILATION AND EVACUATION N/A  10/11/2017   Procedure: suction dilation and evacuation;  Surgeon: Nicholaus Burnard HERO, MD;  Location: [facility];  Service: Gynecology;  Laterality: N/A;   SHOULDER ARTHROSCOPY WITH LABRAL REPAIR Right 03/21/2021   Procedure: SHOULDER ARTHROSCOPY WITH LABRAL REPAIR;  Surgeon: Onesimo Oneil LABOR, MD;  Location: [facility];  Service: Orthopedics;  Laterality: Right;   TUBAL LIGATION Bilateral 02/21/2017   pt decided to not have this done   TUBAL LIGATION Bilateral 01/08/2019   Procedure: POST PARTUM TUBAL LIGATION;  Surgeon: Izell Harari, MD;  Location: [facility];  Service: Gynecology;  Laterality: Bilateral;   WISDOM TOOTH EXTRACTION      OB History     Gravida  6   Para  5   Term  4   Preterm  1   AB  1   Living  6      SAB  1   IAB      Ectopic      Multiple  1   Live Births  6            Home Medications    Prior to Admission medications   Medication Sig Start Date End Date Taking? Authorizing Provider  linaclotide  (LINZESS ) 72 MCG capsule Take 1 capsule (72 mcg total) by mouth daily before breakfast. 06/18/21   Ameduite, Leonna S, FNP    Family History Family History  Problem Relation Age of Onset  Cancer Paternal Grandmother        breast   Diabetes Sister    Asthma Son    Diabetes Maternal Aunt    Cancer Paternal Aunt        cervical, breast   Colon cancer Neg Hx     Social History Social History   Tobacco Use   Smoking status: Former    Current packs/day: 0.00    Types: Cigarettes    Quit date: 07/22/2004    Years since quitting: 19.4   Smokeless tobacco: Never  Vaping Use   Vaping status: Never Used  Substance Use Topics   Alcohol use: No    Comment: none since +UPT   Drug use: No     Allergies   Sulfa antibiotics   Review of Systems Review of Systems Per HPI  Physical Exam Triage Vital Signs ED Triage Vitals  Encounter Vitals Group     BP 01/12/24 1539 116/71     Girls Systolic BP Percentile --      Girls Diastolic BP Percentile  --      Boys Systolic BP Percentile --      Boys Diastolic BP Percentile --      Pulse Rate 01/12/24 1539 94     Resp 01/12/24 1539 18     Temp 01/12/24 1539 98.1 F (36.7 C)     Temp Source 01/12/24 1539 Oral     SpO2 01/12/24 1539 97 %     Weight --      Height --      Head Circumference --      Peak Flow --      Pain Score 01/12/24 1540 6     Pain Loc --      Pain Education --      Exclude from Growth Chart --    No data found.  Updated Vital Signs BP 116/71 (BP Location: Right Arm)   Pulse 94   Temp 98.1 F (36.7 C) (Oral)   Resp 18   LMP 12/19/2023 (Exact Date)   SpO2 97%   Visual Acuity Right Eye Distance:   Left Eye Distance:   Bilateral Distance:    Right Eye Near:   Left Eye Near:    Bilateral Near:     Physical Exam Vitals and nursing note reviewed.  Constitutional:      General: She is not in acute distress.    Appearance: Normal appearance.  HENT:     Head: Normocephalic.  Eyes:     Extraocular Movements: Extraocular movements intact.     Pupils: Pupils are equal, round, and reactive to light.  Cardiovascular:     Rate and Rhythm: Normal rate and regular rhythm.     Pulses: Normal pulses.     Heart sounds: Normal heart sounds.  Pulmonary:     Effort: Pulmonary effort is normal. No respiratory distress.     Breath sounds: Normal breath sounds. No stridor. No wheezing, rhonchi or rales.  Chest:     Chest wall: No tenderness.  Breasts:    Right: Tenderness present.     Comments: Induration noted under the right breast.  Area measures approximately 1 cm in diameter.  The area is slightly raised and erythematous.  There is no oozing, fluctuance, or drainage present. Musculoskeletal:     Cervical back: Normal range of motion.  Skin:    General: Skin is warm and dry.     Comments: Induration noted under the right breast.  Area  measures approximately 1 cm in diameter.  The area is slightly raised and erythematous.  There is no oozing, fluctuance, or  drainage present.  Neurological:     General: No focal deficit present.     Mental Status: She is alert and oriented to person, place, and time.  Psychiatric:        Mood and Affect: Mood normal.        Behavior: Behavior normal.      UC Treatments / Results  Labs (all labs ordered are listed, but only abnormal results are displayed) Labs Reviewed - No data to display  EKG   Radiology No results found.  Procedures Procedures (including critical care time)  Medications Ordered in UC Medications - No data to display  Initial Impression / Assessment and Plan / UC Course  I have reviewed the triage vital signs and the nursing notes.  Pertinent labs & imaging results that were available during my care of the patient were reviewed by me and considered in my medical decision making (see chart for details).  Patient complains of pain with inhalation this been present for the past 3 days.  She denies any injury, or trauma.  States that she experiences soreness in her chest when she moves.  On exam, lung sounds are clear throughout, room air sats are at 97%.  Symptoms consistent with with musculoskeletal etiology.  Will treat with tizanidine 4 mg.  With regard to the right breast, there is a noted induration under the right breast, consistent with cellulitis.  Will treat this area with mupirocin  2% ointment.  Supportive care recommendations were provided and discussed with the patient to include over-the-counter analgesics, warm compresses to the chest and under the breast, and to monitor symptoms for worsening.  Patient was given strict ER follow-up precautions along with indications to follow-up in this clinic or with her PCP.  Patient was in agreement with this plan of care and verbalizes understanding.  All questions were answered.  Patient stable for discharge.  Final Clinical Impressions(s) / UC Diagnoses   Final diagnoses:  None   Discharge Instructions   None    ED  Prescriptions   None    PDMP not reviewed this encounter.   Gilmer Etta PARAS, NP 01/12/24 1554

## 2024-01-24 ENCOUNTER — Ambulatory Visit
Admission: EM | Admit: 2024-01-24 | Discharge: 2024-01-24 | Disposition: A | Discharge status: Home / Self Care | Attending: Family Medicine | Admitting: Family Medicine

## 2024-01-24 ENCOUNTER — Other Ambulatory Visit: Payer: Self-pay

## 2024-01-24 ENCOUNTER — Encounter: Payer: Self-pay | Admitting: Emergency Medicine

## 2024-01-24 DIAGNOSIS — J3089 Other allergic rhinitis: Secondary | ICD-10-CM | POA: Diagnosis not present

## 2024-01-24 DIAGNOSIS — H6991 Unspecified Eustachian tube disorder, right ear: Secondary | ICD-10-CM | POA: Diagnosis not present

## 2024-01-24 MED ORDER — CETIRIZINE HCL 10 MG PO TABS: 10.0000 mg | ORAL_TABLET | Freq: Every day | ORAL | 2 refills | Status: AC

## 2024-01-24 MED ORDER — AZELASTINE HCL 0.1 % NA SOLN: 1.0000 | Freq: Two times a day (BID) | NASAL | 2 refills | Status: AC

## 2024-01-24 MED ORDER — PREDNISONE 50 MG PO TABS: ORAL_TABLET | ORAL | 0 refills | Status: AC

## 2024-01-24 NOTE — ED Triage Notes (Signed)
 Pt reports right ear pain x3 days. Denies any known injury or other symptoms. Reports has tried otc ear drops, cotton ball with no relief of symptoms.

## 2024-01-24 NOTE — ED Provider Notes (Signed)
 [facility]    CSN: [id number] Arrival date & time: 01/24/24  9077      History   Chief Complaint Chief Complaint  Patient presents with   Ear Pain    HPI [name] is a 38 y.o. female.   Presenting today with 3-day history of right ear pain, nasal congestion.  Denies bleeding, drainage, loss of hearing, headaches, fever, chills, body aches.  Trying over-the-counter eardrops with no relief.  History of seasonal allergies not currently on anything for this.    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/31/2013   recurrant    IBS (irritable bowel syndrome)    with constipation   Shoulder dislocation, recurrent     Patient Active Problem List   Diagnosis Date Noted   Pelvic pain 06/11/2022   Pregnancy examination or test, negative result 06/11/2022   Routine Papanicolaou smear 06/11/2022   Nasal congestion 06/11/2022   IDA (iron  deficiency anemia) 07/01/2021   Depression 06/18/2021   Mild dysplasia of cervix (CIN I) 06/18/2021   Undiagnosed cardiac murmurs 06/18/2021   History of IBS 10/26/2019   Constipation 10/26/2019   Acute bilateral low back pain without sciatica 10/26/2019   History of gestational diabetes 11/02/2018   IBS (irritable bowel syndrome) 10/25/2015    Past Surgical History:  Procedure Laterality Date   DILATION AND CURETTAGE OF UTERUS  10/11/2017   incomplete abortion   DILATION AND EVACUATION N/A 10/11/2017   Procedure: suction dilation and evacuation;  Surgeon: Nicholaus Burnard HERO, MD;  Location: [facility];  Service: Gynecology;  Laterality: N/A;   SHOULDER ARTHROSCOPY WITH LABRAL REPAIR Right 03/21/2021   Procedure: SHOULDER ARTHROSCOPY WITH LABRAL REPAIR;  Surgeon: Onesimo Oneil LABOR, MD;  Location: [facility];  Service: Orthopedics;  Laterality: Right;   TUBAL LIGATION Bilateral 02/21/2017   pt decided to not have this done   TUBAL LIGATION Bilateral  01/08/2019   Procedure: POST PARTUM TUBAL LIGATION;  Surgeon: Izell Harari, MD;  Location: [facility];  Service: Gynecology;  Laterality: Bilateral;   WISDOM TOOTH EXTRACTION      OB History     Gravida  6   Para  5   Term  4   Preterm  1   AB  1   Living  6      SAB  1   IAB      Ectopic      Multiple  1   Live Births  6            Home Medications    Prior to Admission medications   Medication Sig Start Date End Date Taking? Authorizing Provider  azelastine (ASTELIN) 0.1 % nasal spray Place 1 spray into both nostrils 2 (two) times daily. Use in each nostril as directed 01/24/24  Yes Stuart Vernell Norris, PA-C  cetirizine (ZYRTEC ALLERGY) 10 MG tablet Take 1 tablet (10 mg total) by mouth daily. 01/24/24  Yes Stuart Vernell Norris, PA-C  predniSONE  (DELTASONE ) 50 MG tablet Take 1 tab daily with breakfast x 3 days 01/24/24  Yes Stuart Vernell Norris, PA-C  linaclotide  (LINZESS ) 72 MCG capsule Take 1 capsule (72 mcg total) by mouth daily before breakfast. 06/18/21   Ameduite, Leonna S, FNP  mupirocin  ointment (BACTROBAN ) 2 % Apply 1 Application topically 2 (two) times daily. 01/12/24   Leath-Warren, Etta PARAS, NP  tiZANidine  (ZANAFLEX ) 4 MG tablet Take 1 tablet (  4 mg total) by mouth every 8 (eight) hours as needed. 01/12/24   Leath-Warren, Etta PARAS, NP    Family History Family History  Problem Relation Age of Onset   Cancer Paternal Grandmother        breast   Diabetes Sister    Asthma Son    Diabetes Maternal Aunt    Cancer Paternal Aunt        cervical, breast   Colon cancer Neg Hx     Social History Social History   Tobacco Use   Smoking status: Former    Current packs/day: 0.00    Types: Cigarettes    Quit date: 07/22/2004    Years since quitting: 19.5   Smokeless tobacco: Never  Vaping Use   Vaping status: Never Used  Substance Use Topics   Alcohol use: No    Comment: none since +UPT   Drug use: No     Allergies   Sulfa  antibiotics   Review of Systems Review of Systems Per HPI  Physical Exam Triage Vital Signs ED Triage Vitals  Encounter Vitals Group     BP 01/24/24 1044 116/75     Girls Systolic BP Percentile --      Girls Diastolic BP Percentile --      Boys Systolic BP Percentile --      Boys Diastolic BP Percentile --      Pulse Rate 01/24/24 1044 73     Resp 01/24/24 1044 18     Temp 01/24/24 1044 98 F (36.7 C)     Temp Source 01/24/24 1044 Oral     SpO2 01/24/24 1044 97 %     Weight --      Height --      Head Circumference --      Peak Flow --      Pain Score 01/24/24 1043 8     Pain Loc --      Pain Education --      Exclude from Growth Chart --    No data found.  Updated Vital Signs BP 116/75 (BP Location: Right Arm)   Pulse 73   Temp 98 F (36.7 C) (Oral)   Resp 18   LMP 01/16/2024 (Exact Date)   SpO2 97%   Visual Acuity Right Eye Distance:   Left Eye Distance:   Bilateral Distance:    Right Eye Near:   Left Eye Near:    Bilateral Near:     Physical Exam Vitals and nursing note reviewed.  Constitutional:      Appearance: Normal appearance.  HENT:     Head: Atraumatic.     Right Ear: External ear normal.     Left Ear: Tympanic membrane and external ear normal.     Ears:     Comments: Right middle ear effusion present    Nose: Rhinorrhea present.     Mouth/Throat:     Mouth: Mucous membranes are moist.     Pharynx: No posterior oropharyngeal erythema.  Eyes:     Extraocular Movements: Extraocular movements intact.     Conjunctiva/sclera: Conjunctivae normal.  Cardiovascular:     Rate and Rhythm: Normal rate.  Pulmonary:     Effort: Pulmonary effort is normal.  Musculoskeletal:        General: Normal range of motion.     Cervical back: Normal range of motion and neck supple.  Skin:    General: Skin is warm and dry.  Neurological:     Mental  Status: She is alert and oriented to person, place, and time.  Psychiatric:        Mood and Affect: Mood  normal.        Thought Content: Thought content normal.      UC Treatments / Results  Labs (all labs ordered are listed, but only abnormal results are displayed) Labs Reviewed - No data to display  EKG   Radiology No results found.  Procedures Procedures (including critical care time)  Medications Ordered in UC Medications - No data to display  Initial Impression / Assessment and Plan / UC Course  I have reviewed the triage vital signs and the nursing notes.  Pertinent labs & imaging results that were available during my care of the patient were reviewed by me and considered in my medical decision making (see chart for details).     Suspect uncontrolled seasonal allergies leading to eustachian tube dysfunction.  Will treat with short course of prednisone , start Zyrtec and Astelin daily as part of allergy regimen, decongestants as needed.  Return for worsening or unresolving symptoms.  Final Clinical Impressions(s) / UC Diagnoses   Final diagnoses:  Seasonal allergic rhinitis due to other allergic trigger  Acute dysfunction of right eustachian tube   Discharge Instructions   None    ED Prescriptions     Medication Sig Dispense Auth. Provider   azelastine (ASTELIN) 0.1 % nasal spray Place 1 spray into both nostrils 2 (two) times daily. Use in each nostril as directed 30 mL Stuart Vernell Norris, PA-C   cetirizine (ZYRTEC ALLERGY) 10 MG tablet Take 1 tablet (10 mg total) by mouth daily. 30 tablet Stuart Vernell Norris, PA-C   predniSONE  (DELTASONE ) 50 MG tablet Take 1 tab daily with breakfast x 3 days 3 tablet Stuart Vernell Norris, [place]      PDMP not reviewed this encounter.   Stuart Vernell Norris, [place] 01/24/24 1129

## 2024-01-26 ENCOUNTER — Ambulatory Visit: Admitting: Adult Health

## 2024-01-26 ENCOUNTER — Encounter: Payer: Self-pay | Admitting: Adult Health

## 2024-01-26 VITALS — BP 121/80 | HR 86 | Ht 64.0 in | Wt 189.0 lb

## 2024-01-26 DIAGNOSIS — N6315 Unspecified lump in the right breast, overlapping quadrants: Secondary | ICD-10-CM | POA: Insufficient documentation

## 2024-01-26 NOTE — Progress Notes (Signed)
  Subjective:     Patient ID: [name], female   DOB: 01/15/1986, 38 y.o.   MRN: [id number]  HPI [name] is a 38 year old black female, married, [id number] in complaining of ?cyst right breast, noticed about 3 weeks ago it is better, but itches some now.     Component Value Date/Time   DIAGPAP  06/11/2022 0958    - Negative for intraepithelial lesion or malignancy (NILM)   DIAGPAP  08/02/2018 0000    NEGATIVE FOR INTRAEPITHELIAL LESIONS OR MALIGNANCY.   HPVHIGH Negative 06/11/2022 0958   ADEQPAP  06/11/2022 0958    Satisfactory for evaluation; transformation zone component PRESENT.   ADEQPAP  08/02/2018 0000    Satisfactory for evaluation  endocervical/transformation zone component PRESENT.    PCP is Dr Bluford.  Review of Systems ?cyst right breast, noticed about 3 weeks ago. +itches now Reviewed past medical,surgical, social and family history. Reviewed medications and allergies.     Objective:   Physical Exam BP 121/80 (BP Location: Left Arm, Patient Position: Sitting, Cuff Size: Normal)   Pulse 86   Ht 5' 4 (1.626 m)   Wt 189 lb (85.7 kg)   LMP 01/16/2024 (Exact Date)   BMI 32.44 kg/m      Skin warm and dry,  Breasts:no dominate palpable mass, retraction or nipple discharge on the left, on the right, no retraction or nipple discharge, has scaly skin like healed boil at 5 0'clock and has pea sized mass at 12 0'clock at edge of areola.   Upstream - 01/26/24 1511       Pregnancy Intention Screening   Does the patient want to become pregnant in the next year? No    Does the patient's partner want to become pregnant in the next year? No    Would the patient like to discuss contraceptive options today? No      Contraception Wrap Up   Current Method Female Sterilization    End Method Female Sterilization    Contraception Counseling Provided No          Assessment:     1. Mass overlapping multiple quadrants of right breast (Primary) has scaly skin like healed boil at  5 0'clock and has pea sized mass at 12 0'clock at edge of areola, right breast Scheduled diagnostic mammogram and US  at [facility] 02/23/24 at 2 pm  - US  LIMITED ULTRASOUND INCLUDING AXILLA RIGHT BREAST; Future - MM 3D DIAGNOSTIC MAMMOGRAM BILATERAL BREAST; Future - US  LIMITED ULTRASOUND INCLUDING AXILLA LEFT BREAST ; Future     Plan:     Follow up prn

## 2024-02-23 ENCOUNTER — Encounter (HOSPITAL_COMMUNITY): Payer: Self-pay

## 2024-02-23 ENCOUNTER — Ambulatory Visit (HOSPITAL_COMMUNITY)
Admission: RE | Admit: 2024-02-23 | Discharge: 2024-02-23 | Disposition: A | Discharge status: Home / Self Care | Source: Ambulatory Visit | Attending: Adult Health | Admitting: Adult Health

## 2024-02-23 ENCOUNTER — Ambulatory Visit: Payer: Self-pay | Admitting: Adult Health

## 2024-02-23 ENCOUNTER — Ambulatory Visit
Admission: RE | Admit: 2024-02-23 | Discharge: 2024-02-23 | Disposition: A | Discharge status: Home / Self Care | Payer: Self-pay | Source: Ambulatory Visit | Attending: Adult Health | Admitting: Adult Health

## 2024-02-23 ENCOUNTER — Other Ambulatory Visit: Payer: Self-pay | Admitting: Adult Health

## 2024-02-23 DIAGNOSIS — N6315 Unspecified lump in the right breast, overlapping quadrants: Secondary | ICD-10-CM | POA: Insufficient documentation
